# Patient Record
Sex: Female | Born: 1955
Health system: Southern US, Community
[De-identification: ages and names within clinical notes are randomized; demographics above are authoritative.]

## PROBLEM LIST (undated history)

## (undated) DIAGNOSIS — Z9889 Other specified postprocedural states: Secondary | ICD-10-CM

## (undated) DIAGNOSIS — Z972 Presence of dental prosthetic device (complete) (partial): Secondary | ICD-10-CM

## (undated) DIAGNOSIS — R112 Nausea with vomiting, unspecified: Secondary | ICD-10-CM

## (undated) DIAGNOSIS — E039 Hypothyroidism, unspecified: Secondary | ICD-10-CM

## (undated) DIAGNOSIS — I10 Essential (primary) hypertension: Secondary | ICD-10-CM

## (undated) DIAGNOSIS — M1990 Unspecified osteoarthritis, unspecified site: Secondary | ICD-10-CM

## (undated) DIAGNOSIS — Z803 Family history of malignant neoplasm of breast: Secondary | ICD-10-CM

## (undated) DIAGNOSIS — K219 Gastro-esophageal reflux disease without esophagitis: Secondary | ICD-10-CM

## (undated) DIAGNOSIS — G4733 Obstructive sleep apnea (adult) (pediatric): Secondary | ICD-10-CM

## (undated) DIAGNOSIS — A879 Viral meningitis, unspecified: Secondary | ICD-10-CM

## (undated) DIAGNOSIS — S83231A Complex tear of medial meniscus, current injury, right knee, initial encounter: Secondary | ICD-10-CM

## (undated) HISTORY — DX: Obstructive sleep apnea (adult) (pediatric): G47.33

## (undated) HISTORY — DX: Essential (primary) hypertension: I10

## (undated) HISTORY — DX: Viral meningitis, unspecified: A87.9

## (undated) HISTORY — PX: TONSILLECTOMY: SUR1361

## (undated) HISTORY — DX: Gastro-esophageal reflux disease without esophagitis: K21.9

## (undated) HISTORY — DX: Family history of malignant neoplasm of breast: Z80.3

## (undated) HISTORY — PX: APPENDECTOMY: SHX54

## (undated) HISTORY — DX: Hypothyroidism, unspecified: E03.9

---

## 1980-04-23 HISTORY — PX: ABDOMINAL HYSTERECTOMY: SHX81

## 1984-04-23 HISTORY — PX: TOTAL ABDOMINAL HYSTERECTOMY W/ BILATERAL SALPINGOOPHORECTOMY: SHX83

## 1985-04-23 HISTORY — PX: VESICOVAGINAL FISTULA CLOSURE W/ TAH: SUR271

## 1997-04-23 HISTORY — PX: BREAST EXCISIONAL BIOPSY: SUR124

## 2004-10-25 ENCOUNTER — Ambulatory Visit: Payer: Self-pay | Admitting: Internal Medicine

## 2005-03-27 ENCOUNTER — Emergency Department: Payer: Self-pay | Admitting: Emergency Medicine

## 2005-04-11 ENCOUNTER — Ambulatory Visit (HOSPITAL_COMMUNITY): Admission: RE | Admit: 2005-04-11 | Discharge: 2005-04-11 | Payer: Self-pay | Admitting: Chiropractic Medicine

## 2005-04-11 ENCOUNTER — Encounter: Payer: Self-pay | Admitting: Cardiology

## 2005-04-11 ENCOUNTER — Ambulatory Visit: Payer: Self-pay | Admitting: Cardiology

## 2005-09-10 ENCOUNTER — Ambulatory Visit: Payer: Self-pay | Admitting: Specialist

## 2005-11-21 HISTORY — PX: KNEE ARTHROSCOPY W/ LASER: SHX1872

## 2005-12-13 ENCOUNTER — Ambulatory Visit: Payer: Self-pay | Admitting: Specialist

## 2006-04-23 DIAGNOSIS — E039 Hypothyroidism, unspecified: Secondary | ICD-10-CM

## 2006-04-23 HISTORY — DX: Hypothyroidism, unspecified: E03.9

## 2006-06-27 ENCOUNTER — Ambulatory Visit: Payer: Self-pay | Admitting: Unknown Physician Specialty

## 2006-08-22 DIAGNOSIS — A879 Viral meningitis, unspecified: Secondary | ICD-10-CM

## 2006-08-22 HISTORY — DX: Viral meningitis, unspecified: A87.9

## 2006-09-03 ENCOUNTER — Emergency Department: Payer: Self-pay | Admitting: Emergency Medicine

## 2006-09-05 ENCOUNTER — Inpatient Hospital Stay: Payer: Self-pay | Admitting: Internal Medicine

## 2006-11-14 ENCOUNTER — Ambulatory Visit: Payer: Self-pay | Admitting: Internal Medicine

## 2006-11-14 DIAGNOSIS — K219 Gastro-esophageal reflux disease without esophagitis: Secondary | ICD-10-CM | POA: Insufficient documentation

## 2006-11-14 DIAGNOSIS — E039 Hypothyroidism, unspecified: Secondary | ICD-10-CM | POA: Insufficient documentation

## 2007-01-23 ENCOUNTER — Ambulatory Visit: Payer: Self-pay | Admitting: Internal Medicine

## 2007-02-04 ENCOUNTER — Ambulatory Visit: Payer: Self-pay

## 2007-02-24 ENCOUNTER — Encounter (INDEPENDENT_AMBULATORY_CARE_PROVIDER_SITE_OTHER): Payer: Self-pay | Admitting: *Deleted

## 2007-05-15 ENCOUNTER — Ambulatory Visit: Payer: Self-pay | Admitting: Internal Medicine

## 2007-05-29 ENCOUNTER — Telehealth (INDEPENDENT_AMBULATORY_CARE_PROVIDER_SITE_OTHER): Payer: Self-pay | Admitting: *Deleted

## 2007-05-30 ENCOUNTER — Ambulatory Visit: Payer: Self-pay | Admitting: Internal Medicine

## 2007-08-07 ENCOUNTER — Telehealth: Payer: Self-pay | Admitting: Internal Medicine

## 2007-08-12 ENCOUNTER — Ambulatory Visit: Payer: Self-pay | Admitting: Internal Medicine

## 2007-08-13 ENCOUNTER — Encounter: Payer: Self-pay | Admitting: Internal Medicine

## 2008-01-05 ENCOUNTER — Emergency Department: Payer: Self-pay | Admitting: Emergency Medicine

## 2008-07-06 ENCOUNTER — Ambulatory Visit: Payer: Self-pay | Admitting: Family Medicine

## 2008-07-16 ENCOUNTER — Encounter (INDEPENDENT_AMBULATORY_CARE_PROVIDER_SITE_OTHER): Payer: Self-pay | Admitting: Internal Medicine

## 2008-09-24 ENCOUNTER — Ambulatory Visit: Payer: Self-pay | Admitting: Internal Medicine

## 2008-09-24 DIAGNOSIS — R0989 Other specified symptoms and signs involving the circulatory and respiratory systems: Secondary | ICD-10-CM | POA: Insufficient documentation

## 2008-09-24 DIAGNOSIS — R0609 Other forms of dyspnea: Secondary | ICD-10-CM

## 2008-10-13 ENCOUNTER — Ambulatory Visit: Payer: Self-pay | Admitting: Pulmonary Disease

## 2008-10-13 DIAGNOSIS — G4733 Obstructive sleep apnea (adult) (pediatric): Secondary | ICD-10-CM | POA: Insufficient documentation

## 2008-10-26 ENCOUNTER — Ambulatory Visit: Payer: Self-pay

## 2008-10-26 ENCOUNTER — Encounter: Payer: Self-pay | Admitting: Internal Medicine

## 2008-11-02 ENCOUNTER — Ambulatory Visit: Payer: Self-pay | Admitting: Unknown Physician Specialty

## 2008-11-16 ENCOUNTER — Ambulatory Visit (HOSPITAL_BASED_OUTPATIENT_CLINIC_OR_DEPARTMENT_OTHER): Admission: RE | Admit: 2008-11-16 | Discharge: 2008-11-16 | Payer: Self-pay | Admitting: Pulmonary Disease

## 2008-11-16 ENCOUNTER — Encounter: Payer: Self-pay | Admitting: Pulmonary Disease

## 2008-12-04 ENCOUNTER — Ambulatory Visit: Payer: Self-pay | Admitting: Pulmonary Disease

## 2008-12-06 ENCOUNTER — Telehealth (INDEPENDENT_AMBULATORY_CARE_PROVIDER_SITE_OTHER): Payer: Self-pay | Admitting: *Deleted

## 2008-12-13 ENCOUNTER — Ambulatory Visit: Payer: Self-pay | Admitting: Pulmonary Disease

## 2008-12-15 ENCOUNTER — Ambulatory Visit: Payer: Self-pay | Admitting: Internal Medicine

## 2009-01-21 ENCOUNTER — Ambulatory Visit: Payer: Self-pay | Admitting: Internal Medicine

## 2009-01-28 ENCOUNTER — Ambulatory Visit: Payer: Self-pay | Admitting: Pulmonary Disease

## 2009-02-04 ENCOUNTER — Ambulatory Visit: Payer: Self-pay | Admitting: Internal Medicine

## 2009-05-26 ENCOUNTER — Emergency Department: Payer: Self-pay | Admitting: Emergency Medicine

## 2009-08-25 ENCOUNTER — Ambulatory Visit: Payer: Self-pay

## 2009-09-30 ENCOUNTER — Ambulatory Visit: Payer: Self-pay | Admitting: Internal Medicine

## 2009-09-30 DIAGNOSIS — J309 Allergic rhinitis, unspecified: Secondary | ICD-10-CM | POA: Insufficient documentation

## 2009-10-09 ENCOUNTER — Emergency Department: Payer: Self-pay | Admitting: Emergency Medicine

## 2009-10-10 ENCOUNTER — Ambulatory Visit: Payer: Self-pay | Admitting: Internal Medicine

## 2009-10-10 DIAGNOSIS — R030 Elevated blood-pressure reading, without diagnosis of hypertension: Secondary | ICD-10-CM | POA: Insufficient documentation

## 2009-10-27 ENCOUNTER — Ambulatory Visit: Payer: Self-pay | Admitting: Pulmonary Disease

## 2009-11-14 ENCOUNTER — Ambulatory Visit: Payer: Self-pay | Admitting: Internal Medicine

## 2009-11-14 DIAGNOSIS — I872 Venous insufficiency (chronic) (peripheral): Secondary | ICD-10-CM | POA: Insufficient documentation

## 2009-11-16 ENCOUNTER — Ambulatory Visit: Payer: Self-pay | Admitting: Unknown Physician Specialty

## 2010-01-03 ENCOUNTER — Telehealth: Payer: Self-pay | Admitting: Internal Medicine

## 2010-05-22 ENCOUNTER — Ambulatory Visit
Admission: RE | Admit: 2010-05-22 | Discharge: 2010-05-22 | Payer: Self-pay | Source: Home / Self Care | Attending: Internal Medicine | Admitting: Internal Medicine

## 2010-05-23 NOTE — Progress Notes (Signed)
Summary: lab tests at work  Phone Note Call from Patient Call back at [facility] Phone [phone] Call back at [phone]   Caller: Patient Call For: Sabrina Salt MD Summary of Call: Pt says she had some lab work done at work as part of a screening and some levels came back elevated, indicating possible lupus or arthritis.  I asked her what the tests were and she said she doesnt know, that she is unable to get a copy of the results. I advised her to call the people in charge and at least find out what the tests were and what her levels were.  She said she would try. Initial call taken by: Lowella Petties CMA,  January 03, 2010 4:08 PM  Follow-up for Phone Call        I really can't comment further unless she gets the test results If they did a test on her, she has a RIGHT to a copy. She needs to assert herself and get a copy so I can review Often tests are done without a good reason and then their interpretation is very difficult Follow-up by: Sabrina Salt MD,  January 03, 2010 8:06 PM  Additional Follow-up for Phone Call Additional follow up Details #1::        spoke with patient and she will try and get lab results, per pt this was like a blind study and the people doing the tests don't know her name? I advised pt Dr.Letvak would need the results before anything can be done. Additional Follow-up by: Mervin Hack CMA Duncan Dull),  January 04, 2010 8:13 AM

## 2010-05-23 NOTE — Assessment & Plan Note (Signed)
Summary: CPX / LFW   Vital Signs:  Patient profile:   55 year old female Weight:      247 pounds Temp:     97.0 degrees F tympanic Pulse rate:   72 / minute Pulse rhythm:   regular BP sitting:   130 / 90  (right arm) Cuff size:   large  Vitals Entered By: Mervin Hack CMA (AAMA) (September 30, 2009 10:00 AM) CC: ADULT PHYSICAL   History of Present Illness: Doing well Hasn't been consistent with CPAP works 3rd shift, comes home and just can't wear the mask discussed that she needs to discussed with Dr Christena Deem mask  Has noted some easy bruising has been using some ibuprofen for her knees will check blood work    Allergies: No Known Drug Allergies  Past History:  Past medical, surgical, family and social histories (including risk factors) reviewed for relevance to current acute and chronic problems.  Past Medical History: Viral meningitis--5/08 [facility]) Hypothyroidism--2008 GERD Allergic rhinitis   [facility] Dr Arvil Chaco  Past Surgical History: Reviewed history from 11/14/2006 and no changes required. L knee arthroscopy  8/07 [facility]) Appendectomy as teenager Tonsillectomy as child Hysterectomy 1987  (fibroids)  Family History: Reviewed history from 11/14/2006 and no changes required. Dad alive and well--just had TKR Mom alive and well 3  1/2 sisters 1  1/2 brother No CAD, DM HTN on both sides Cancer on Mom's side--GF, GGF prostate or bladder cancer Mat aunt--breast cancer  Social History: Reviewed history from 09/24/2008 and no changes required. 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 Eyes:  Denies double vision and vision loss-1  eye. ENT:  Denies decreased hearing and ringing in ears; teeth okay--regular with dentist. CV:  Complains of fainting; denies chest pain or discomfort, difficulty breathing at night, difficulty breathing while lying down, lightheadness, palpitations, and shortness of breath with exertion; passed out at work in [facility] out in ER. Nothing found. Resp:  Denies cough and shortness of breath. GI:  Complains of indigestion; denies abdominal pain, bloody stools, change in bowel habits, dark tarry stools, nausea, and vomiting; uses PPI as needed only. GU:  Complains of incontinence; denies dysuria; urgency and occ incontinence gyn appt soon--Dr Arvil Chaco no sexual problems. MS:  Complains of joint pain; denies joint swelling; left knee pain--saw [facility] (cartilage deterioration). Derm:  Complains of rash; denies lesion(s); rash in [place] last week. Better now--thinks it may have been the heat. Neuro:  Complains of headaches; denies numbness, tingling, and weakness; occ headaches. Psych:  Denies anxiety and depression. Heme:  Complains of abnormal bruising; denies enlarge lymph nodes. Allergy:  Complains of seasonal allergies and sneezing; occ uses OTC meds.  Physical Exam  General:  alert and normal appearance.   Eyes:  pupils equal, pupils round, and pupils reactive to light.   Ears:  R ear normal and L ear normal.   Mouth:  no erythema, no exudates, and no lesions.   Neck:  supple, no masses, no thyromegaly, no carotid bruits, and no cervical lymphadenopathy.   Lungs:  normal respiratory effort and normal breath sounds.   Heart:  normal rate, regular rhythm, no murmur, and no gallop.   Abdomen:  soft, non-tender, and no masses.  Msk:  no joint tenderness and no joint swelling.   Pulses:  1+ in feet Extremities:  no edema Neurologic:  alert & oriented X3, strength normal in all extremities, and gait normal.   Skin:  no rashes and no suspicious lesions.   No pathologic bruises  noted on forearm Axillary Nodes:  No palpable lymphadenopathy Psych:  normally interactive, good eye contact, not anxious appearing, and not depressed appearing.     Impression & Recommendations:  Problem # 1:  PREVENTIVE HEALTH CARE (ICD-V70.0) Assessment Comment Only healthy discused fitness Needs Tdap  Problem # 2:  OBSTRUCTIVE SLEEP APNEA (ICD-327.23) Assessment: Comment Only not wearing mask--can't sleep with it will ask Dr Shelle Iron to review this  Problem # 3:  HYPOTHYROIDISM (ICD-244.9) Assessment: Unchanged  due for labs  Her updated medication list for this problem includes:    Levothyroxine Sodium 50 Mcg Tabs (Levothyroxine sodium) .Marland Kitchen... Take 1 tablet by mouth once a day  Orders: Venipuncture (43329) TLB-Renal Function Panel (80069-RENAL) TLB-CBC Platelet - w/Differential (85025-CBCD) TLB-Hepatic/Liver Function Pnl (80076-HEPATIC) TLB-TSH (Thyroid Stimulating Hormone) (84443-TSH) TLB-Lipid Panel (80061-LIPID)  Problem # 4:  GERD (ICD-530.81) Assessment: Comment Only uses meds occ  Her updated medication list for this problem includes:    Prilosec Otc 20 Mg Tbec (Omeprazole magnesium) .Marland Kitchen... As needed  Complete Medication List: 1)  Levothyroxine Sodium 50 Mcg Tabs (Levothyroxine sodium) .... Take 1 tablet by mouth once a day 2)  Prilosec Otc 20 Mg Tbec (Omeprazole magnesium) .... As needed  Other Orders: Tdap => 11yrs IM (51884) Admin 1st Vaccine (16606) Admin 1st Vaccine [facility]) [phone])  Patient Instructions: 1)  Please schedule a follow-up appointment in 1 year.  Prescriptions: LEVOTHYROXINE SODIUM 50 MCG  TABS (LEVOTHYROXINE SODIUM) Take 1 tablet by mouth once a day  #90 x 3   Entered by:   Mervin Hack CMA (AAMA)   Authorized by:   Cindee Salt MD   Signed by:   Mervin Hack CMA (AAMA) on 09/30/2009   Method used:   Print then Give to Patient   RxID:   [id number] LEVOTHYROXINE SODIUM 50 MCG  TABS (LEVOTHYROXINE SODIUM) Take 1  tablet by mouth once a day  #30 x 0   Entered by:   Mervin Hack CMA (AAMA)   Authorized by:   Cindee Salt MD   Signed by:   Mervin Hack CMA (AAMA) on 09/30/2009   Method used:   Electronically to        [facility] [address] [phone]* (retail)       [address], [place]  [address]       Ph: [phone]       Fax: [phone]   RxID:   [id number]   Current Allergies (reviewed today): No known allergies    Tetanus/Td Vaccine    Vaccine Type: Tdap    Site: left deltoid    Mfr: GlaxoSmithKline    Dose: 0.5 ml    Route: IM    Given by: Mervin Hack CMA (AAMA)    Exp. Date: 07/16/2011    Lot #: [id number]    VIS given: 03/11/07 version given September 30, 2009.

## 2010-05-23 NOTE — Assessment & Plan Note (Signed)
Summary: acute sick visit for osa/cpap intolerance   Copy to:  Dr. Tillman Abide Primary Provider/Referring Provider:  Dr. Tillman Abide  CC:  OV - problems with cpap - Pt reports not wearing cpap on a regular basis - c/o feels like she's smothering - Pt now having increased HBP and primary md wants to a recheck on cpap.  History of Present Illness: the pt comes in today for f/u of her known osa.  She has mild osa by sleep study, and last seen in Oct of last year.  Unfortunately, she has not been compliant with her cpap, and the quesiton has been raised whether this is impacting her BP readings.  The pt c/o an inability to tolerate the pressure, although it is hard for her to pinpoint the actual tolerance issue.  She denies any issue with mask, and feels that it fits well and is comfortable.  Without the cpap,she is not sleeping well, and has signiificant daytime sleepiness.  Current Medications (verified): 1)  Levothyroxine Sodium 50 Mcg  Tabs (Levothyroxine Sodium) .... Take 1 Tablet By Mouth Once A Day 2)  Prilosec Otc 20 Mg  Tbec (Omeprazole Magnesium) .... As Needed 3)  Chlorthalidone 25 Mg Tabs (Chlorthalidone) .... 1/2 Tab Daily. Increase To Full Tab As Directed  Allergies (verified): No Known Drug Allergies  Past History:  Past medical, surgical, family and social histories (including risk factors) reviewed, and no changes noted (except as noted below).  Past Medical History: Reviewed history from 09/30/2009 and no changes required. Viral meningitis--5/08 [facility]) Hypothyroidism--2008 GERD Allergic rhinitis   [facility] Dr Arvil Chaco  Past Surgical History: Reviewed history from 11/14/2006 and no changes required. L knee arthroscopy  8/07 [facility]) Appendectomy as teenager Tonsillectomy as child Hysterectomy 1987  (fibroids)  Family History: Reviewed history from 11/14/2006 and no changes required. Dad alive and well--just had TKR Mom alive and well 3  1/2  sisters 1  1/2 brother No CAD, DM HTN on both sides Cancer on Mom's side--GF, GGF prostate or bladder cancer Mat aunt--breast cancer  Social History: Reviewed history from 09/24/2008 and no changes required. 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    Vital Signs:  Patient profile:   55 year old female Height:      65 inches Weight:      245 pounds BMI:     40.92 O2 Sat:      96 % on Room air Temp:     98 degrees F oral Pulse rate:   95 / minute BP sitting:   120 / 68  (left arm) Cuff size:   large  Vitals Entered By: Abigail Miyamoto RN (October 27, 2009 1:57 PM)  O2 Flow:  Room air  Physical Exam  General:  ow female in nad Nose:  swollen turbs, erythematous mucosa, no purulence Mouth:  elongation of soft palate and uvula Extremities:  1+ edema bilat, no cyanosis Neurologic:  sleepy,  but appropriate, moves all 4.   Impression & Recommendations:  Problem # 1:  OBSTRUCTIVE SLEEP APNEA (ICD-327.23) the pt is having very poor tolerance to cpap, but is willing to continue trying.  It is hard to pinpoint the problem, but it sounds like she isn't tolerating the pressure....this is usually the exhalation pressure.  I think we should give her a short trial of bipap to see if there is  better tolerance.  I also discussed with her a dental appliance,but with her nasal congestion this would be difficult.  I think NSR/UP3 per ent would help a lot, but she isn't prepared to consider surgery.  Regarding the question of whether her osa can affect her BP, I suspect not given the mild nature of her disease.  However, each individual is different.  The pt is to call me to update on how her bipap trial is going.  Other Orders: Est. Patient Level IV (84132) DME Referral (DME)  Patient Instructions: 1)  will try bipap device for the next 2-3 weeks to see if we can improve tolerance.  Please call if having tolerance issues. 2)  work on weight loss 3)  nasonex 2 sprays each nostril each am..let me know if helps, and we can call in prescription. 4)  please call at end of 2-3 weeks to let me know how things are going.

## 2010-05-23 NOTE — Assessment & Plan Note (Signed)
Summary: DISCUSS BP/ lb   Vital Signs:  Patient profile:   55 year old female Weight:      244 pounds O2 Sat:      98 % on Room air Pulse rate:   64 / minute Pulse rhythm:   regular BP sitting:   146 / 96  (left arm) Cuff size:   large  Vitals Entered By: Mervin Hack CMA Duncan Dull) (October 10, 2009 3:54 PM)  O2 Flow:  Room air  Serial Vital Signs/Assessments:  Time      Position  BP       Pulse  Resp  Temp     By           R Arm     128/94                         Cindee Salt MD  CC: blood pressure running high   History of Present Illness: At work yesterday--got hot feeling then dizzy after bending over Nurse at work checked BP and it was 192/108 sat for awhile and then she went home noted dizzy feeling when trying to lie down  went to ER worried about stroke CT scan and other work up negative BP only 133/92 by her recollection  No regular headaches no chest pain No SOB no palpitations  Allergies: No Known Drug Allergies  Past History:  Past medical, surgical, family and social histories (including risk factors) reviewed, and no changes noted (except as noted below).  Past Medical History: Reviewed history from 09/30/2009 and no changes required. Viral meningitis--5/08 [facility]) Hypothyroidism--2008 GERD Allergic rhinitis   [facility] Dr Arvil Chaco  Past Surgical History: Reviewed history from 11/14/2006 and no changes required. L knee arthroscopy  8/07 [facility]) Appendectomy as teenager Tonsillectomy as child Hysterectomy 1987  (fibroids)  Family History: Reviewed history from 11/14/2006 and no changes required. Dad alive and well--just had TKR Mom alive and well 3  1/2 sisters 1  1/2 brother No CAD, DM HTN on both sides Cancer on Mom's side--GF, GGF prostate or bladder cancer Mat aunt--breast cancer  Social History: Reviewed history from 09/24/2008 and no changes required. 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   Neck:  supple, no masses, no thyromegaly, no carotid bruits, and no cervical lymphadenopathy.   Lungs:  normal respiratory effort and normal breath sounds.   Heart:  normal rate, regular rhythm, no murmur, and no gallop.   Extremities:  ?slight calf edema Psych:  normally interactive, good eye contact, not anxious appearing, and not depressed appearing.     Impression & Recommendations:  Problem # 1:  ELEVATED BLOOD PRESSURE WITHOUT DIAGNOSIS OF HYPERTENSION (ICD-796.2) Assessment New  borderline high value may have been due to acute dizziness (not vertigo) still not using CPAP  she is concerned about her legs---some swelling and asks for a fluid pill  P: will start chlorthalidone    recheck 1 month    try to check BP at least once a week till follow up  Her updated medication list for this problem includes:    Chlorthalidone 25 Mg Tabs (Chlorthalidone) .Marland Kitchen... 1/2  tab daily. increase to full tab as directed  Complete Medication List: 1)  Levothyroxine Sodium 50 Mcg Tabs (Levothyroxine sodium) .... Take 1 tablet by mouth once a day 2)  Prilosec Otc 20 Mg Tbec (Omeprazole magnesium) .... As needed 3)  Chlorthalidone 25 Mg Tabs (Chlorthalidone) .... 1/2 tab daily. increase to full tab as directed  Patient Instructions: 1)  Please schedule a follow-up appointment in 1 month.  2)  Please check your blood pressure at least weekly Prescriptions: CHLORTHALIDONE 25 MG TABS (CHLORTHALIDONE) 1/2 tab daily. Increase to full tab as directed  #30 x 11   Entered and Authorized by:   Cindee Salt MD   Signed by:   Cindee Salt MD on 10/10/2009   Method used:    Electronically to        [facility] [address] [phone]* (retail)       [address] [place]  [address]       Ph: [phone]       Fax: [phone]   RxID:   [id number]   Current Allergies (reviewed today): No known allergies

## 2010-05-23 NOTE — Assessment & Plan Note (Signed)
Summary: 2:15 1 M F/U DLO   Vital Signs:  Patient profile:   55 year old female Weight:      242.50 pounds Temp:     98.3 degrees F oral Pulse rate:   64 / minute Pulse rhythm:   regular BP sitting:   114 / 82  (left arm) Cuff size:   large  Vitals Entered By: Sydell Axon LPN (November 14, 2009 2:10 PM) CC: One month follow-up, needs a refill on her Levothyroxine at local pharmacy   History of Present Illness: Saw Dr Shelle Iron Still setting up trial of BiPap (hasn't gotten the machine yet--will get loaner)  DOesn't note much difference for fluid status doesn't think she voids as much as she should still only taking 1/2 tab Edema does seem some better  Home BP 110/80 up to 156/93 Most  ~130/80  Allergies: No Known Drug Allergies  Past History:  Past medical, surgical, family and social histories (including risk factors) reviewed for relevance to current acute and chronic problems.  Past Medical History: Reviewed history from 09/30/2009 and no changes required. Viral meningitis--5/08 [facility]) Hypothyroidism--2008 GERD Allergic rhinitis   [facility] Dr Arvil Chaco  Past Surgical History: Reviewed history from 11/14/2006 and no changes required. L knee arthroscopy  8/07 [facility]) Appendectomy as teenager Tonsillectomy as child Hysterectomy 1987  (fibroids)  Family History: Reviewed history from 11/14/2006 and no changes required. Dad alive and well--just had TKR Mom alive and well 3  1/2 sisters 1  1/2 brother No CAD, DM HTN on both sides Cancer on Mom's side--GF, GGF prostate or bladder cancer Mat aunt--breast cancer  Social History: Reviewed history from 09/24/2008 and no changes required. 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   Neck:  supple, no masses, and no thyromegaly.   Lungs:  normal respiratory effort and normal breath sounds.   Heart:  normal rate, regular rhythm, no murmur, and no gallop.   Extremities:  1+ in ankles thick calves without pitting Psych:  normally interactive, good eye contact, not anxious appearing, and not depressed appearing.     Impression & Recommendations:  Problem # 1:  EDEMA- LOCALIZED (ICD-782.3) Assessment Improved  better on the chlorthalidone BP now very good will continue the 1/2 daily  Her updated medication list for this problem includes:    Chlorthalidone 25 Mg Tabs (Chlorthalidone) .Marland Kitchen... 1/2 tab daily. increase to full tab as directed  Orders: Venipuncture (16109) TLB-Renal Function Panel (80069-RENAL) TLB-CBC Platelet - w/Differential (85025-CBCD)  Complete Medication List: 1)  Levothyroxine Sodium 50 Mcg Tabs (Levothyroxine sodium) .... Take 1 tablet by mouth once a day 2)  Prilosec Otc 20 Mg Tbec (Omeprazole magnesium) .... As needed 3)  Chlorthalidone 25 Mg Tabs (Chlorthalidone) .... 1/2 tab daily. increase to full tab as directed  Patient Instructions: 1)  Please schedule a follow-up appointment in 6 months .  2)  Please send labs to [facility] Prescriptions: LEVOTHYROXINE SODIUM 50 MCG  TABS (LEVOTHYROXINE SODIUM) Take 1 tablet by mouth once a day  #30 x 11   Entered by:  Sydell Axon LPN   Authorized by:   Cindee Salt MD   Signed by:   Sydell Axon LPN on 60/45/4098   Method used:   Electronically to        [facility]. [address] [phone]* (retail)       [address] [place]  [address]       Ph: [phone]       Fax: [phone]   RxID:   [id number]   Current Allergies (reviewed today): No known allergies   Appended Document: 2:15 1 M  F/U DLO

## 2010-05-31 NOTE — Assessment & Plan Note (Signed)
Summary: 6 m f/u dlo   Vital Signs:  Patient profile:   55 year old female Weight:      251 pounds Pulse rate:   81 / minute Pulse rhythm:   regular BP sitting:   120 / 78  (left arm) Cuff size:   large  Vitals Entered By: Mervin Hack CMA Duncan Dull) (May 22, 2010 4:10 PM) CC: 6 month follow-up   History of Present Illness: reviewed the phone note from the fall She discussed with  the lab person---she said they were testing something in a study she had consented to  Was told it was nothing to worry about  Still taking chlorthalidone has moved up to a full tab daily Fluid is still a problem--she notes when she gets up and later in the day On her legs all day Occ has throbbing Occ wears support socks--doesn't think it helps more  No SOB No chest pain Walks in the morning if knee is okay  Stomach okay Continues on the omeprazole and zantac Not as good as the nexium hasn't been able to afford it daily  No problems with thyroid  Allergies: No Known Drug Allergies  Past History:  Past medical, surgical, family and social histories (including risk factors) reviewed for relevance to current acute and chronic problems.  Past Medical History: Reviewed history from 09/30/2009 and no changes required. Viral meningitis--5/08 [facility]) Hypothyroidism--2008 GERD Allergic rhinitis   [facility]ULT[facility] Dr Arvil Chaco  Past Surgical History: Reviewed history from 11/14/2006 and no changes required. L knee arthroscopy  8/07 [facility]) Appendectomy as teenager Tonsillectomy as child Hysterectomy 1987  (fibroids)  Family History: Reviewed history from 11/14/2006 and no changes required. Dad alive and well--just had TKR Mom alive and well 3  1/2 sisters 1  1/2 brother No CAD, DM HTN on both sides Cancer on Mom's side--GF, GGF prostate or bladder cancer Mat aunt--breast cancer  Social History: Reviewed history from 09/24/2008 and no changes required. 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   Neck:  supple, no masses, no thyromegaly, and no cervical lymphadenopathy.   Lungs:  normal respiratory effort, no intercostal retractions, no accessory muscle use, and normal breath sounds.   Heart:  normal rate, regular rhythm, no murmur, and no gallop.   Abdomen:  soft and non-tender.   Msk:  no joint tenderness and no joint swelling.   Extremities:  thick calves without sig pitting ?slight edema Psych:  normally interactive, good eye contact, not anxious appearing, and not depressed appearing.     Impression & Recommendations:  Problem # 1:  EDEMA- LOCALIZED (ICD-782.3) Assessment Unchanged due to venous insufficiency could change to lasix for better effect but will keep the chlorthalidone asked her to change to salt substitute  Her updated medication list for this problem includes:    Chlorthalidone 25 Mg Tabs (Chlorthalidone) .Marland Kitchen... 1 tab daily to reduce leg swelling  Problem # 2:  GERD (ICD-530.81) Assessment: Deteriorated has note been able to afford daily Rx liked nexium but never really took the omeprazole daily will send Rx for this   The following medications  were removed from the medication list:    Prilosec Otc 20 Mg Tbec (Omeprazole magnesium) .Marland Kitchen... As needed Her updated medication list for this problem includes:    Omeprazole 20 Mg Cpdr (Omeprazole) .Marland Kitchen... 1 tab daily for acid reflux  Problem # 3:  HYPOTHYROIDISM (ICD-244.9) Assessment: Unchanged euthyroid on dose labs next time  Her updated medication list for this problem  includes:    Levothyroxine Sodium 50 Mcg Tabs (Levothyroxine sodium) .Marland Kitchen... Take 1 tablet by mouth once a day  Complete Medication List: 1)  Levothyroxine Sodium 50 Mcg Tabs (Levothyroxine sodium) .... Take 1 tablet by mouth once a day 2)  Chlorthalidone 25 Mg Tabs (Chlorthalidone) .Marland Kitchen.. 1 tab daily to reduce leg swelling 3)  Omeprazole 20 Mg Cpdr (Omeprazole) .Marland Kitchen.. 1 tab daily for acid reflux  Patient Instructions: 1)  Please switch to salt substitute 2)  Please schedule a follow-up appointment in 6 months .  Prescriptions: OMEPRAZOLE 20 MG CPDR (OMEPRAZOLE) 1 tab daily for acid reflux  #90 x 3   Entered and Authorized by:   Cindee Salt MD   Signed by:   Cindee Salt MD on 05/22/2010   Method used:   Electronically to        [facility] [address] (retail)       [facility]       [address]       [place], [place]  [address]       Ph: [phone]       Fax: [phone]   RxID:   [id number]    Orders Added: 1)  Est. Patient Level IV [25956]    Current Allergies (reviewed today): No known allergies

## 2010-09-05 NOTE — Procedures (Signed)
Sabrina [name], ARTZ NO.:  [id number]   MEDICAL RECORD NO.:  [id number]          PATIENT TYPE:  OUT   LOCATION:  SLEEP CENTER                 FACILITY[facility]  [facility]   PHYSICIAN:  Barbaraann Share, MD,FCCPDATE OF BIRTH:  11-08-57   DATE OF STUDY:  12/04/2008                            NOCTURNAL POLYSOMNOGRAM   REFERRING PHYSICIAN:  Barbaraann Share, MD,FCCP   INDICATION FOR STUDY:  Hypersomnia with sleep apnea.   EPWORTH SLEEPINESS SCORE:  22.   MEDICATIONS:   SLEEP ARCHITECTURE:  The patient had a total sleep time of 360 minutes  with adequate slow wave sleep, but decreased quantity of REM.  Sleep  onset latency was normal at 12 minutes, and REM onset was normal at 68  minutes.  Sleep efficiency was good at 93%.   RESPIRATORY DATA:  The patient was found to have 54 obstructive apneas  and 29 obstructive hypopneas for a apnea-hypopnea index of 14 events per  hour.  The events occurred primarily during REM, and there was loud  snoring noted throughout.   OXYGEN DATA:  There was O2 desaturation as low as 79% with the patient's  obstructive events.   CARDIAC DATA:  No clinically significant arrhythmias were seen.   MOVEMENT-PARASOMNIA:  Small numbers of leg jerks with no significant  sleep disruption were noted.   IMPRESSIONS-RECOMMENDATIONS:  Mild obstructive sleep apnea/hypopnea  syndrome with an apnea-hypopnea index of 14 events per hour and oxygen  desaturation as low as 79%.  The patient's events occurred primarily  during REM and therefore this degree of sleep apnea may have a greater  impact on her quality of life.  Treatment for this degree of sleep apnea  can include a trial of weight loss alone, upper airway surgery, dental  appliance, and also CPAP.  Clinical correlation is suggested.     Barbaraann Share, MD,FCCP  Diplomate, American Board of Sleep  Medicine  Electronically Signed    [facility]MED[facility]  D:  12/04/2008 15:11:52  T:  12/04/2008 22:44:00   Job:  [id number]

## 2010-09-07 ENCOUNTER — Telehealth: Payer: Self-pay | Admitting: *Deleted

## 2010-09-11 NOTE — Telephone Encounter (Signed)
Opened in error

## 2010-09-12 ENCOUNTER — Ambulatory Visit: Payer: 59

## 2010-11-14 ENCOUNTER — Encounter: Payer: Self-pay | Admitting: *Deleted

## 2010-11-30 ENCOUNTER — Encounter: Payer: Self-pay | Admitting: Internal Medicine

## 2010-12-01 ENCOUNTER — Encounter: Payer: Self-pay | Admitting: Internal Medicine

## 2011-02-27 ENCOUNTER — Ambulatory Visit (INDEPENDENT_AMBULATORY_CARE_PROVIDER_SITE_OTHER): Payer: 59 | Admitting: Internal Medicine

## 2011-02-27 ENCOUNTER — Encounter: Payer: Self-pay | Admitting: Internal Medicine

## 2011-02-27 DIAGNOSIS — G589 Mononeuropathy, unspecified: Secondary | ICD-10-CM

## 2011-02-27 DIAGNOSIS — R609 Edema, unspecified: Secondary | ICD-10-CM

## 2011-02-27 DIAGNOSIS — G629 Polyneuropathy, unspecified: Secondary | ICD-10-CM

## 2011-02-27 DIAGNOSIS — Z0000 Encounter for general adult medical examination without abnormal findings: Secondary | ICD-10-CM

## 2011-02-27 DIAGNOSIS — E039 Hypothyroidism, unspecified: Secondary | ICD-10-CM

## 2011-02-27 NOTE — Assessment & Plan Note (Signed)
will check sugar, b12, etc

## 2011-02-27 NOTE — Progress Notes (Signed)
Subjective:    Patient ID: Sabrina [name], female    DOB: August 05, 1955, 55 y.o.   MRN: [id number]  HPI Doing well in general  Having burning sensations in hands Thought it could be gloves at work but no change with different gloves Mostly on dorsal hands occ some in legs also Having leg cramps at night--may even awaken her  Still with [employer] on 3rd shift Does okay with this Still part time as caregiver as well  Doesn't take flu shots---got sick with one years ago Sees Dr Haskel Khan still--has appt in 2 days with him UTD with mammograms  Current Outpatient Prescriptions on File Prior to Visit  Medication Sig Dispense Refill  . chlorthalidone (HYGROTON) 25 MG tablet Take 25 mg by mouth daily.        Marland Kitchen levothyroxine (SYNTHROID, LEVOTHROID) 50 MCG tablet Take 50 mcg by mouth daily.        Marland Kitchen omeprazole (PRILOSEC) 20 MG capsule Take 20 mg by mouth daily.          No Known Allergies  Past Medical History  Diagnosis Date  . Viral meningitis 5/08    [facility]  . Hypothyroidism 2008  . GERD (gastroesophageal reflux disease)   . Allergic rhinitis   . Obstructive sleep apnea     Past Surgical History  Procedure Date  . Knee arthroscopy w/ laser 8/07    Left  . Appendectomy     teenager  . Tonsillectomy     as child  . Vesicovaginal fistula closure w/ tah 1987    Fibroids    Family History  Problem Relation Age of Onset  . Coronary artery disease Neg Hx   . Diabetes Neg Hx   . Hypertension      both sides  . Cancer      maternal side  . Prostate cancer Maternal Grandfather   . Breast cancer Maternal Aunt     History   Social History  . Marital Status: Married    Spouse Name: N/A    Number of Children: N/A  . Years of Education: N/A   Occupational History  . 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     Social History Main Topics  . Smoking status: Never Smoker   . Smokeless tobacco: Never Used  . Alcohol Use: Yes     beer on weekends  . Drug Use: Not on file  . Sexually  Active: Not on file   Other Topics Concern  . Not on file   Social History Narrative   From [place]. Moved to [place] in high school then back her. 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   Review of Systems  Constitutional: Negative for fatigue and unexpected weight change.       Wears seat belt  HENT: Positive for congestion and rhinorrhea. Negative for hearing loss, dental problem and tinnitus.        Uses OTC loratadine prn Regular with dentist  Eyes: Negative for visual disturbance.       No diplopia or unilateral vision loss  Respiratory: Positive for shortness of breath. Negative for cough and chest tightness.        Occ SOB if exposed to ammonia or other strong cleansers  Cardiovascular: Positive for leg swelling. Negative for chest pain and palpitations.       Still with edema despite diuretic  Gastrointestinal: Negative for nausea, vomiting, abdominal pain, constipation and blood in stool.       No heartburn  Genitourinary: Negative for  dysuria, urgency, frequency, difficulty urinating and dyspareunia.  Musculoskeletal: Positive for arthralgias. Negative for back pain and joint swelling.       Some left knee pain Walks for exercise on her break and occ at home also  Skin: Positive for rash.       No suspicious areas Has been diagnosed with rosacea by derm---got Rx  Neurological: Positive for numbness. Negative for dizziness, syncope, weakness, light-headedness and headaches.  Hematological: Negative for adenopathy. Does not bruise/bleed easily.  Psychiatric/Behavioral: Negative for sleep disturbance and dysphoric mood. The patient is not nervous/anxious.        Objective:   Physical Exam  Constitutional: She is oriented to person, place, and time. She appears well-developed and well-nourished. No distress.  HENT:  Head: Normocephalic and atraumatic.  Right Ear: External ear normal.  Left Ear: External ear normal.  Mouth/Throat: Oropharynx is clear and moist. No  oropharyngeal exudate.       TMs normal  Eyes: Conjunctivae and EOM are normal. Pupils are equal, round, and reactive to light.  Neck: Normal range of motion. Neck supple. No thyromegaly present.  Cardiovascular: Normal rate, regular rhythm, normal heart sounds and intact distal pulses.  Exam reveals no gallop.   No murmur heard. Pulmonary/Chest: Effort normal and breath sounds normal. No respiratory distress. She has no wheezes. She has no rales.  Abdominal: Soft. There is no tenderness.  Musculoskeletal: Normal range of motion. She exhibits edema. She exhibits no tenderness.       2+ non pitting edema in calves  Lymphadenopathy:    She has no cervical adenopathy.  Neurological: She is alert and oriented to person, place, and time.  Skin: No rash noted. No erythema.  Psychiatric: She has a normal mood and affect. Her behavior is normal. Judgment and thought content normal.          Assessment & Plan:

## 2011-02-27 NOTE — Assessment & Plan Note (Signed)
Due for labs

## 2011-02-27 NOTE — Progress Notes (Signed)
Addended by: Alvina Chou on: 02/27/2011 02:55 PM   Modules accepted: Orders

## 2011-02-27 NOTE — Assessment & Plan Note (Signed)
No striking concerns but needs to work on fitness and weight loss Will get mammo through Dr Arvil Chaco

## 2011-02-27 NOTE — Assessment & Plan Note (Signed)
Ongoing despite the diuretic Will check labs Just got compression hose

## 2011-03-06 ENCOUNTER — Encounter: Payer: Self-pay | Admitting: *Deleted

## 2011-04-26 ENCOUNTER — Ambulatory Visit: Payer: Self-pay | Admitting: Unknown Physician Specialty

## 2011-04-27 ENCOUNTER — Ambulatory Visit: Payer: Self-pay | Admitting: Unknown Physician Specialty

## 2011-05-14 ENCOUNTER — Ambulatory Visit: Payer: Self-pay | Admitting: General Surgery

## 2011-05-15 LAB — PATHOLOGY REPORT

## 2011-11-06 ENCOUNTER — Other Ambulatory Visit: Payer: Self-pay | Admitting: *Deleted

## 2011-11-06 MED ORDER — OMEPRAZOLE 20 MG PO CPDR: 20.0000 mg | DELAYED_RELEASE_CAPSULE | Freq: Every day | ORAL | Status: DC

## 2011-11-07 ENCOUNTER — Other Ambulatory Visit: Payer: Self-pay | Admitting: *Deleted

## 2011-11-07 MED ORDER — LEVOTHYROXINE SODIUM 50 MCG PO TABS: 50.0000 ug | ORAL_TABLET | Freq: Every day | ORAL | Status: DC

## 2011-11-07 MED ORDER — CHLORTHALIDONE 25 MG PO TABS: 25.0000 mg | ORAL_TABLET | Freq: Every day | ORAL | Status: DC

## 2011-11-08 ENCOUNTER — Encounter: Payer: Self-pay | Admitting: Internal Medicine

## 2011-11-08 ENCOUNTER — Ambulatory Visit (INDEPENDENT_AMBULATORY_CARE_PROVIDER_SITE_OTHER): Payer: 59 | Admitting: Internal Medicine

## 2011-11-08 VITALS — BP 140/90 | HR 63 | Temp 97.8°F | Ht 65.0 in | Wt 253.0 lb

## 2011-11-08 DIAGNOSIS — E039 Hypothyroidism, unspecified: Secondary | ICD-10-CM

## 2011-11-08 DIAGNOSIS — G4733 Obstructive sleep apnea (adult) (pediatric): Secondary | ICD-10-CM

## 2011-11-08 DIAGNOSIS — R609 Edema, unspecified: Secondary | ICD-10-CM

## 2011-11-08 MED ORDER — FUROSEMIDE 40 MG PO TABS: 40.0000 mg | ORAL_TABLET | Freq: Every day | ORAL | Status: DC | PRN

## 2011-11-08 NOTE — Assessment & Plan Note (Signed)
Seems to be euthyroid Given worsened edema, will recheck labs

## 2011-11-08 NOTE — Assessment & Plan Note (Signed)
Sleeps fair Works 3rd shift so some problems Still on CPAP Schedule could be adding to her edema issues

## 2011-11-08 NOTE — Assessment & Plan Note (Signed)
Has clearly worsened May be due to weather No dietary changes Will add prn lasix Recheck labs

## 2011-11-08 NOTE — Progress Notes (Signed)
Subjective:    Patient ID: Sabrina [name], female    DOB: 1955/10/29, 56 y.o.   MRN: [id number]  HPI Feet swelling has worsened Started about 3 weeks  No changes in diet---working with weight coach to be careful (still skips breakfast)  Some pain in feet Do get better at night then progressively worsen Keeps them elevated when she can sit Not comfortable with support hose  Occ feels some SOB--notes when flat in bed (like turning over). Not really orthopneic No PND though Some DOE with steps--not really different  Current Outpatient Prescriptions on File Prior to Visit  Medication Sig Dispense Refill  . chlorthalidone (HYGROTON) 25 MG tablet Take 1 tablet (25 mg total) by mouth daily.  90 tablet  3  . levothyroxine (SYNTHROID, LEVOTHROID) 50 MCG tablet Take 1 tablet (50 mcg total) by mouth daily.  90 tablet  3  . omeprazole (PRILOSEC) 20 MG capsule Take 1 capsule (20 mg total) by mouth daily.  90 capsule  3    No Known Allergies  Past Medical History  Diagnosis Date  . Viral meningitis 5/08    [facility]  . Hypothyroidism 2008  . GERD (gastroesophageal reflux disease)   . Allergic rhinitis   . Obstructive sleep apnea     Past Surgical History  Procedure Date  . Knee arthroscopy w/ laser 8/07    Left  . Appendectomy     teenager  . Tonsillectomy     as child  . Vesicovaginal fistula closure w/ tah 1987    Fibroids    Family History  Problem Relation Age of Onset  . Coronary artery disease Neg Hx   . Diabetes Neg Hx   . Hypertension      both sides  . Cancer      maternal side  . Prostate cancer Maternal Grandfather   . Breast cancer Maternal Aunt     History   Social History  . Marital Status: Married    Spouse Name: N/A    Number of Children: N/A  . Years of Education: N/A   Occupational History  . 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     Social History Main Topics  . Smoking status: Never Smoker   . Smokeless tobacco: Never Used  . Alcohol Use: Yes     beer on  weekends  . Drug Use: Not on file  . Sexually Active: Not on file   Other Topics Concern  . Not on file   Social History Narrative   From [place]. Moved to [place] in high school then back her. 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   Review of Systems No sores or open areas in feet No rash     Objective:   Physical Exam  Constitutional: She appears well-developed and well-nourished. No distress.  Neck: Normal range of motion. Neck supple. No JVD present. No thyromegaly present.  Cardiovascular: Normal rate, regular rhythm and normal heart sounds.  Exam reveals no gallop.   No murmur heard. Pulmonary/Chest: Effort normal and breath sounds normal. No respiratory distress. She has no wheezes. She has no rales.  Abdominal: Soft. There is no tenderness.       No hepatomegaly  Musculoskeletal: She exhibits edema. She exhibits no tenderness.       2+ mildly pitting edema up to upper thigh Mild tenderness in feet Homan's negative  Lymphadenopathy:    She has no cervical adenopathy.  Skin: No rash noted. No erythema.  Assessment & Plan:

## 2011-11-09 LAB — CBC WITH DIFFERENTIAL/PLATELET
Basophils Absolute: 0 10*3/uL (ref 0.0–0.2)
Eosinophils Absolute: 0.2 10*3/uL (ref 0.0–0.4)
Immature Grans (Abs): 0 10*3/uL (ref 0.0–0.1)
Immature Granulocytes: 0 % (ref 0–2)
Lymphs: 36 % (ref 14–46)
MCH: 29.4 pg (ref 26.6–33.0)
MCHC: 33.4 g/dL (ref 31.5–35.7)
Neutrophils Absolute: 4.3 10*3/uL (ref 1.8–7.8)
Neutrophils Relative %: 53 % (ref 40–74)
RDW: 13.4 % (ref 12.3–15.4)

## 2011-11-09 LAB — BASIC METABOLIC PANEL
Chloride: 103 mmol/L (ref 97–108)
GFR calc Af Amer: 109 mL/min/{1.73_m2} (ref >59)
Glucose: 104 mg/dL — ABNORMAL HIGH (ref 65–99)
Potassium: 4.2 mmol/L (ref 3.5–5.2)

## 2011-11-09 LAB — HEPATIC FUNCTION PANEL
ALT: 13 IU/L (ref 0–32)
Bilirubin, Direct: 0.1 mg/dL (ref 0.00–0.40)

## 2011-11-09 LAB — TSH: TSH: 3.62 u[IU]/mL (ref 0.450–4.500)

## 2011-11-12 ENCOUNTER — Encounter: Payer: Self-pay | Admitting: *Deleted

## 2011-11-13 ENCOUNTER — Ambulatory Visit: Payer: Self-pay | Admitting: General Surgery

## 2012-02-29 ENCOUNTER — Ambulatory Visit (INDEPENDENT_AMBULATORY_CARE_PROVIDER_SITE_OTHER): Payer: 59 | Admitting: Internal Medicine

## 2012-02-29 ENCOUNTER — Encounter: Payer: Self-pay | Admitting: Internal Medicine

## 2012-02-29 VITALS — BP 138/80 | HR 72 | Temp 98.2°F | Wt 249.0 lb

## 2012-02-29 DIAGNOSIS — Z0000 Encounter for general adult medical examination without abnormal findings: Secondary | ICD-10-CM

## 2012-02-29 DIAGNOSIS — R609 Edema, unspecified: Secondary | ICD-10-CM

## 2012-02-29 DIAGNOSIS — E039 Hypothyroidism, unspecified: Secondary | ICD-10-CM

## 2012-02-29 NOTE — Assessment & Plan Note (Signed)
Healthy but not good condition Has tried to eat more healthy but doesn't eat regularly  Must do more walking and eat regular meals to help metabolic rate

## 2012-02-29 NOTE — Progress Notes (Signed)
Subjective:    Patient ID: Sabrina [name], female    DOB: 1956-02-21, 56 y.o.   MRN: [id number]  HPI Has been having some ongoing back itching Seeing dermatologist Gave shot yesterday and ordered special medication--topical  Swelling persists Fairly regular with the diuretic  Still sees Dr Haskel Khan Had breast biopsy by Dr Lemar Livings this spring---was negative  Current Outpatient Prescriptions on File Prior to Visit  Medication Sig Dispense Refill  . chlorthalidone (HYGROTON) 25 MG tablet Take 1 tablet (25 mg total) by mouth daily.  90 tablet  3  . furosemide (LASIX) 40 MG tablet Take 1 tablet (40 mg total) by mouth daily as needed.  30 tablet  1  . levothyroxine (SYNTHROID, LEVOTHROID) 50 MCG tablet Take 1 tablet (50 mcg total) by mouth daily.  90 tablet  3  . omeprazole (PRILOSEC) 20 MG capsule Take 1 capsule (20 mg total) by mouth daily.  90 capsule  3    No Known Allergies  Past Medical History  Diagnosis Date  . Viral meningitis 5/08    [facility]  . Hypothyroidism 2008  . GERD (gastroesophageal reflux disease)   . Allergic rhinitis   . Obstructive sleep apnea     Past Surgical History  Procedure Date  . Knee arthroscopy w/ laser 8/07    Left  . Appendectomy     teenager  . Tonsillectomy     as child  . Vesicovaginal fistula closure w/ tah 1987    Fibroids  . Abdominal hysterectomy     Family History  Problem Relation Age of Onset  . Coronary artery disease Neg Hx   . Diabetes Neg Hx   . Hypertension      both sides  . Cancer      maternal side  . Prostate cancer Maternal Grandfather   . Breast cancer Maternal Aunt   . Cancer Maternal Aunt     breast cancer  . Cancer Mother     mastectomy for breast cancer    History   Social History  . Marital Status: Married    Spouse Name: N/A    Number of Children: N/A  . Years of Education: N/A   Occupational History  . 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 Caregiver --part time     [employer]   Social  History Main Topics  . Smoking status: Never Smoker   . Smokeless tobacco: Never Used  . Alcohol Use: Yes     Comment: beer on weekends  . Drug Use: Not on file  . Sexually Active: Not on file   Other Topics Concern  . Not on file   Social History Narrative   From [place]. Moved to [place] in high school then back her. 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   Review of Systems  Constitutional: Negative for fatigue and unexpected weight change.       Wears seat belt Tries to walk on 15 minute breaks at work  HENT: Positive for congestion and rhinorrhea. Negative for hearing loss, dental problem and tinnitus.        Uses OTC loratadine prn Regular with dentist  Eyes: Negative for visual disturbance.       No diplopia or unilateral vision loss  Respiratory: Positive for cough, chest tightness and shortness of breath.        Gets tight and voice changes when exposed to cleansers  Cardiovascular: Positive for leg swelling. Negative for chest pain and palpitations.  Edema better with elevation Uses support hose but they hurt  Gastrointestinal: Negative for nausea, vomiting, abdominal pain, constipation and blood in stool.       Heartburn controlled with med  Genitourinary: Positive for urgency and frequency. Negative for difficulty urinating.       Occasional stress and urge incontinence No sex--no problem  Musculoskeletal: Negative for back pain, joint swelling and arthralgias.  Skin: Positive for rash.       Back itching  Neurological: Positive for weakness and headaches. Negative for dizziness, syncope, light-headedness and numbness.       Rare headaches--mostly if tired Feels her hand strength is decreased  Hematological: Negative for adenopathy. Bruises/bleeds easily.  Psychiatric/Behavioral: Negative for sleep disturbance and dysphoric mood. The patient is not nervous/anxious.        Objective:   Physical Exam  Constitutional: She is oriented to person, place, and  time. She appears well-developed and well-nourished. No distress.  HENT:  Head: Normocephalic and atraumatic.  Right Ear: External ear normal.  Left Ear: External ear normal.  Mouth/Throat: Oropharynx is clear and moist.  Eyes: Conjunctivae normal and EOM are normal. Pupils are equal, round, and reactive to light.  Neck: Normal range of motion. Neck supple. No thyromegaly present.  Cardiovascular: Normal rate, regular rhythm, normal heart sounds and intact distal pulses.  Exam reveals no gallop.   No murmur heard. Pulmonary/Chest: Effort normal and breath sounds normal. No respiratory distress. She has no wheezes. She has no rales.  Abdominal: Soft. Bowel sounds are normal. There is no tenderness.  Musculoskeletal: Normal range of motion. She exhibits no tenderness.       Very thick calves but no pitting  Lymphadenopathy:    She has no cervical adenopathy.  Neurological: She is alert and oriented to person, place, and time.  Skin: No rash noted. No erythema.       No visible rash to correspond to her pruritic areas  Psychiatric: She has a normal mood and affect. Her behavior is normal. Thought content normal.          Assessment & Plan:

## 2012-02-29 NOTE — Assessment & Plan Note (Signed)
Uses elevation and the diuretics

## 2012-02-29 NOTE — Assessment & Plan Note (Signed)
On replacement

## 2012-03-08 ENCOUNTER — Emergency Department: Payer: Self-pay | Admitting: Emergency Medicine

## 2012-06-13 ENCOUNTER — Ambulatory Visit (INDEPENDENT_AMBULATORY_CARE_PROVIDER_SITE_OTHER): Payer: 59 | Admitting: Internal Medicine

## 2012-06-13 ENCOUNTER — Encounter: Payer: Self-pay | Admitting: Internal Medicine

## 2012-06-13 VITALS — BP 128/88 | HR 73 | Temp 98.2°F | Resp 12 | Wt 259.0 lb

## 2012-06-13 DIAGNOSIS — R1013 Epigastric pain: Secondary | ICD-10-CM

## 2012-06-13 NOTE — Assessment & Plan Note (Signed)
Non specific Mostly epigastric tenderness but some in RUQ--though Murphy's sign absent Could be gallbladder still  ?gastritis from NSAIDs (though on PPI) Could be stress related  P: double prilosec for now      Check labs     Stop NSAIDS

## 2012-06-13 NOTE — Progress Notes (Signed)
Subjective:    Patient ID: Sabrina [name], female    DOB: July 07, 1955, 57 y.o.   MRN: [id number]  HPI Having belly pain Feeling bad all week Cold chills, mouth tastes sour Epigastric pain constant since yesterday Sprite helped some Scared to eat--- just had some crackers last night Some ginger ale today Some nausea but no vomiting No exacerbating factors  Bowels have been regular No blood in stool--did go today No fever  Has been having some headaches Using aleve and then aspirin last week and some earlier in week  Current Outpatient Prescriptions on File Prior to Visit  Medication Sig Dispense Refill  . chlorthalidone (HYGROTON) 25 MG tablet Take 1 tablet (25 mg total) by mouth daily.  90 tablet  3  . furosemide (LASIX) 40 MG tablet Take 1 tablet (40 mg total) by mouth daily as needed.  30 tablet  1  . levothyroxine (SYNTHROID, LEVOTHROID) 50 MCG tablet Take 1 tablet (50 mcg total) by mouth daily.  90 tablet  3  . omeprazole (PRILOSEC) 20 MG capsule Take 1 capsule (20 mg total) by mouth daily.  90 capsule  3   No current facility-administered medications on file prior to visit.    No Known Allergies  Past Medical History  Diagnosis Date  . Viral meningitis 5/08    [facility]  . Hypothyroidism 2008  . GERD (gastroesophageal reflux disease)   . Allergic rhinitis   . Obstructive sleep apnea     Past Surgical History  Procedure Laterality Date  . Knee arthroscopy w/ laser  8/07    Left  . Appendectomy      teenager  . Tonsillectomy      as child  . Vesicovaginal fistula closure w/ tah  1987    Fibroids  . Abdominal hysterectomy      Family History  Problem Relation Age of Onset  . Coronary artery disease Neg Hx   . Diabetes Neg Hx   . Hypertension      both sides  . Cancer      maternal side  . Prostate cancer Maternal Grandfather   . Breast cancer Maternal Aunt   . Cancer Maternal Aunt     breast cancer  . Cancer Mother     mastectomy for breast cancer     History   Social History  . Marital Status: Married    Spouse Name: N/A    Number of Children: N/A  . Years of Education: N/A   Occupational History  . 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 Caregiver --part time     [employer]   Social History Main Topics  . Smoking status: Never Smoker   . Smokeless tobacco: Never Used  . Alcohol Use: Yes     Comment: beer on weekends  . Drug Use: Not on file  . Sexually Active: Not on file   Other Topics Concern  . Not on file   Social History Narrative   From [place]. Moved to [place] in high school then back her. 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     Review of Systems Has ben coughing and that causes hoarseness Slight shortness of breath Some sinus symptoms with sneezing and rhinorrhea for a week    Objective:   Physical Exam  Constitutional: She appears well-developed and well-nourished. No distress.  Neck: Normal range of motion. No thyromegaly present.  Pulmonary/Chest: Effort normal and breath sounds normal. No respiratory distress. She has no wheezes. She  has no rales.  Abdominal: Soft. Bowel sounds are normal. She exhibits no distension and no mass. There is tenderness. There is no rebound and no guarding.  Musculoskeletal: She exhibits no edema and no tenderness.  Lymphadenopathy:    She has no cervical adenopathy.  Psychiatric: She has a normal mood and affect. Her behavior is normal.          Assessment & Plan:

## 2012-06-13 NOTE — Patient Instructions (Signed)
Please stop all over the counter pain medications except acetaminophen. Please take the omeprazole twice a day for now (till the pain is better) Try to eat small amounts of bland food---crackers, yogurt, etc

## 2012-06-14 LAB — CBC WITH DIFFERENTIAL/PLATELET
Basos: 0 % (ref 0–3)
Eosinophils Absolute: 0.1 10*3/uL (ref 0.0–0.4)
Immature Grans (Abs): 0 10*3/uL (ref 0.0–0.1)
Immature Granulocytes: 0 % (ref 0–2)
Lymphs: 31 % (ref 14–46)
MCH: 29.8 pg (ref 26.6–33.0)
Neutrophils Relative %: 63 % (ref 40–74)
RBC: 4.23 x10E6/uL (ref 3.77–5.28)
RDW: 13.9 % (ref 12.3–15.4)
WBC: 9.1 10*3/uL (ref 3.4–10.8)

## 2012-06-14 LAB — BASIC METABOLIC PANEL
BUN: 10 mg/dL (ref 6–24)
Creatinine, Ser: 0.74 mg/dL (ref 0.57–1.00)
GFR calc Af Amer: 106 mL/min/{1.73_m2} (ref >59)
GFR calc non Af Amer: 92 mL/min/{1.73_m2} (ref >59)
Glucose: 98 mg/dL (ref 65–99)
Potassium: 3.9 mmol/L (ref 3.5–5.2)

## 2012-06-14 LAB — HEPATIC FUNCTION PANEL
Bilirubin, Direct: 0.11 mg/dL (ref 0.00–0.40)
Total Protein: 6.7 g/dL (ref 6.0–8.5)

## 2012-06-14 LAB — LIPASE: Lipase: 28 U/L (ref 0–59)

## 2012-08-12 ENCOUNTER — Emergency Department: Payer: Self-pay | Admitting: Emergency Medicine

## 2012-08-12 LAB — CBC
HCT: 41.1 % (ref 35.0–47.0)
HGB: 13.6 g/dL (ref 12.0–16.0)
MCHC: 33.1 g/dL (ref 32.0–36.0)
MCV: 89 fL (ref 80–100)
Platelet: 244 10*3/uL (ref 150–440)
RBC: 4.61 10*6/uL (ref 3.80–5.20)
WBC: 11.7 10*3/uL — ABNORMAL HIGH (ref 3.6–11.0)

## 2012-08-12 LAB — URINALYSIS, COMPLETE
Bilirubin,UR: NEGATIVE
Glucose,UR: NEGATIVE mg/dL (ref 0–75)
Nitrite: NEGATIVE
Ph: 6 (ref 4.5–8.0)
RBC,UR: 3 /HPF (ref 0–5)
Specific Gravity: 1.014 (ref 1.003–1.030)
WBC UR: 15 /HPF (ref 0–5)

## 2012-08-12 LAB — COMPREHENSIVE METABOLIC PANEL
Alkaline Phosphatase: 74 U/L (ref 50–136)
Anion Gap: 5 — ABNORMAL LOW (ref 7–16)
BUN: 10 mg/dL (ref 7–18)
Bilirubin,Total: 0.7 mg/dL (ref 0.2–1.0)
Calcium, Total: 8.9 mg/dL (ref 8.5–10.1)
Chloride: 101 mmol/L (ref 98–107)
Osmolality: 268 (ref 275–301)
Potassium: 3.8 mmol/L (ref 3.5–5.1)
Sodium: 134 mmol/L — ABNORMAL LOW (ref 136–145)
Total Protein: 7.5 g/dL (ref 6.4–8.2)

## 2012-08-21 ENCOUNTER — Ambulatory Visit (INDEPENDENT_AMBULATORY_CARE_PROVIDER_SITE_OTHER): Payer: 59 | Admitting: Internal Medicine

## 2012-08-21 ENCOUNTER — Encounter: Payer: Self-pay | Admitting: Internal Medicine

## 2012-08-21 VITALS — BP 140/100 | HR 70 | Temp 98.0°F | Wt 252.0 lb

## 2012-08-21 DIAGNOSIS — R519 Headache, unspecified: Secondary | ICD-10-CM | POA: Insufficient documentation

## 2012-08-21 DIAGNOSIS — R51 Headache: Secondary | ICD-10-CM

## 2012-08-21 MED ORDER — CYPROHEPTADINE HCL 4 MG PO TABS: 4.0000 mg | ORAL_TABLET | Freq: Every day | ORAL | Status: DC

## 2012-08-21 NOTE — Patient Instructions (Signed)
Please take the cyproheptadine at bedtime. Try to avoid the pain relievers. If your headache persists, I will refer you to a headache specialist

## 2012-08-21 NOTE — Progress Notes (Signed)
Subjective:    Patient ID: Sabrina [name], female    DOB: 12-11-1955, 57 y.o.   MRN: [id number]  HPI Has been having headaches for about 2 weeks  Awoke 4/21 around 2AM --was vomiting and then diarrhea Went to walk in clinic the next day---sent to ER with probably dehydration Had fever and given sublingual med for nausea Given 2 bags of IV fluids also Abdominal pain then ---got IV meds for that also Got info about UTI but hadn't had symptoms. No antibiotics Was told to take tramadol for a week  Stomach is still cramping but no longer having vomiting Stools are loose but not really diarrhea  Ongoing headaches Wakes with nagging frontal headache Takes tylenol, aleve---tries to avoid but has to take for relief (not quite daily though) Tramadol not that helpful  Current Outpatient Prescriptions on File Prior to Visit  Medication Sig Dispense Refill  . chlorthalidone (HYGROTON) 25 MG tablet Take 1 tablet (25 mg total) by mouth daily.  90 tablet  3  . furosemide (LASIX) 40 MG tablet Take 1 tablet (40 mg total) by mouth daily as needed.  30 tablet  1  . levothyroxine (SYNTHROID, LEVOTHROID) 50 MCG tablet Take 1 tablet (50 mcg total) by mouth daily.  90 tablet  3  . omeprazole (PRILOSEC) 20 MG capsule Take 1 capsule (20 mg total) by mouth daily.  90 capsule  3   No current facility-administered medications on file prior to visit.    No Known Allergies  Past Medical History  Diagnosis Date  . Viral meningitis 5/08    [facility]  . Hypothyroidism 2008  . GERD (gastroesophageal reflux disease)   . Allergic rhinitis   . Obstructive sleep apnea     Past Surgical History  Procedure Laterality Date  . Knee arthroscopy w/ laser  8/07    Left  . Appendectomy      teenager  . Tonsillectomy      as child  . Vesicovaginal fistula closure w/ tah  1987    Fibroids  . Abdominal hysterectomy      Family History  Problem Relation Age of Onset  . Coronary artery disease Neg Hx   . Diabetes  Neg Hx   . Hypertension      both sides  . Cancer      maternal side  . Prostate cancer Maternal Grandfather   . Breast cancer Maternal Aunt   . Cancer Maternal Aunt     breast cancer  . Cancer Mother     mastectomy for breast cancer    History   Social History  . Marital Status: Married    Spouse Name: N/A    Number of Children: N/A  . Years of Education: N/A   Occupational History  . 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 Caregiver --part time     [employer]   Social History Main Topics  . Smoking status: Never Smoker   . Smokeless tobacco: Never Used  . Alcohol Use: Yes     Comment: beer on weekends  . Drug Use: Not on file  . Sexually Active: Not on file   Other Topics Concern  . Not on file   Social History Narrative   From [place]. Moved to [place] in high school then back her. 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   Review of Systems No fever now No dysuria or hematuria Sleeps on 1 pillow No neck problems Still works third  Objective:   Physical Exam  Constitutional: She appears well-developed and well-nourished. No distress.  Looks tired  Eyes: Conjunctivae and EOM are normal. Pupils are equal, round, and reactive to light.  Sharp discs  Neck: Normal range of motion. Neck supple. No thyromegaly present.  Cardiovascular: Normal rate, regular rhythm and normal heart sounds.   Pulmonary/Chest: Effort normal and breath sounds normal. No respiratory distress. She has no wheezes. She has no rales.  Abdominal: Soft. There is no tenderness.  Musculoskeletal: She exhibits no edema and no tenderness.  Lymphadenopathy:    She has no cervical adenopathy.  Neurological: She is alert. She has normal strength. She displays no atrophy and no tremor. No cranial nerve deficit. She exhibits abnormal muscle tone. She displays a negative Romberg sign. Coordination and gait normal.  Psychiatric: She has a normal mood and affect. Her behavior is normal.           Assessment & Plan:

## 2012-08-21 NOTE — Assessment & Plan Note (Signed)
Non specific symptoms Has been using analgesics a lot  Some nausea still--but not migrainous Normal neuro exam  Will try cyproheptadine at night to try to break cycle Avoid analgesics Headache referral if persists

## 2012-09-11 ENCOUNTER — Telehealth: Payer: Self-pay

## 2012-09-11 NOTE — Telephone Encounter (Signed)
Pt left v/m h/a is better but feet are swollen and pt wants med for swollen feet. Left v/m for pt to call back.

## 2012-09-12 MED ORDER — FUROSEMIDE 40 MG PO TABS: 40.0000 mg | ORAL_TABLET | Freq: Every day | ORAL | Status: DC | PRN

## 2012-09-12 NOTE — Telephone Encounter (Signed)
If not taking the lasix, then start.  If taking once a day already, then increase to 2 in AM.  Use for the next 3 days if needed and then f/u with PCP is sx persist.  Thanks.

## 2012-09-12 NOTE — Telephone Encounter (Signed)
Left detailed message on voicemail.  

## 2012-09-12 NOTE — Telephone Encounter (Signed)
Left v/m for pt to call back. Dr Alphonsus Sias not available Please advise.

## 2012-11-04 ENCOUNTER — Ambulatory Visit: Payer: 59 | Admitting: Internal Medicine

## 2012-11-05 ENCOUNTER — Encounter: Payer: Self-pay | Admitting: Internal Medicine

## 2012-11-05 ENCOUNTER — Ambulatory Visit (INDEPENDENT_AMBULATORY_CARE_PROVIDER_SITE_OTHER): Payer: 59 | Admitting: Internal Medicine

## 2012-11-05 VITALS — BP 140/90 | HR 89 | Temp 98.5°F | Wt 263.0 lb

## 2012-11-05 DIAGNOSIS — R0600 Dyspnea, unspecified: Secondary | ICD-10-CM

## 2012-11-05 DIAGNOSIS — R609 Edema, unspecified: Secondary | ICD-10-CM

## 2012-11-05 DIAGNOSIS — R0989 Other specified symptoms and signs involving the circulatory and respiratory systems: Secondary | ICD-10-CM

## 2012-11-05 MED ORDER — FUROSEMIDE 40 MG PO TABS: 40.0000 mg | ORAL_TABLET | Freq: Every day | ORAL | Status: DC | PRN

## 2012-11-05 NOTE — Assessment & Plan Note (Signed)
Given change in exercise tolerance and massive edema-- will check echo

## 2012-11-05 NOTE — Assessment & Plan Note (Signed)
Fairly severe now Most likely dietary indiscretion (cold cuts, etc) and venous insufficiency Will try the furosemide regularly echo

## 2012-11-05 NOTE — Patient Instructions (Signed)
Please stop all cold cuts (unless very low salt) and only use salt substitute You take the chlorthalidone every day regardless Now take the furosemide 40mg  every day, till your swelling is better.

## 2012-11-05 NOTE — Progress Notes (Signed)
Subjective:    Patient ID: Sabrina [name], female    DOB: 12-01-1955, 57 y.o.   MRN: [id number]  HPI Having more trouble with leg/feet swelling Going back for a while but worse now Worried about her heart  Some pain at first---hobbles, but then improves some after moving around No improvement overnight  No chest pain No palpitations No dizziness or syncope Walks 2 --15 minute stretches during work day. Tries to go at a quick pace. Does note some easier DOE--but not always Sleeps okay.  Sleeps on 1 pillow---no PND. Nocturia x 2-3-no change  Doesn't add salt after cooking but does use some with cooking Uses support hose frequently---but has to hold off at times when legs are pinched  Current Outpatient Prescriptions on File Prior to Visit  Medication Sig Dispense Refill  . chlorthalidone (HYGROTON) 25 MG tablet Take 1 tablet (25 mg total) by mouth daily.  90 tablet  3  . cyproheptadine (PERIACTIN) 4 MG tablet Take 1 tablet (4 mg total) by mouth at bedtime.  30 tablet  1  . furosemide (LASIX) 40 MG tablet Take 1-2 tablets (40-80 mg total) by mouth daily as needed.  30 tablet  0  . levothyroxine (SYNTHROID, LEVOTHROID) 50 MCG tablet Take 1 tablet (50 mcg total) by mouth daily.  90 tablet  3  . omeprazole (PRILOSEC) 20 MG capsule Take 1 capsule (20 mg total) by mouth daily.  90 capsule  3  . promethazine (PHENERGAN) 25 MG tablet Take 25 mg by mouth every 6 (six) hours as needed.       . traMADol (ULTRAM) 50 MG tablet Take 50-100 mg by mouth every 6 (six) hours as needed.        No current facility-administered medications on file prior to visit.    No Known Allergies  Past Medical History  Diagnosis Date  . Viral meningitis 5/08    [facility]  . Hypothyroidism 2008  . GERD (gastroesophageal reflux disease)   . Allergic rhinitis   . Obstructive sleep apnea     Past Surgical History  Procedure Laterality Date  . Knee arthroscopy w/ laser  8/07    Left  . Appendectomy     teenager  . Tonsillectomy      as child  . Vesicovaginal fistula closure w/ tah  1987    Fibroids  . Abdominal hysterectomy      Family History  Problem Relation Age of Onset  . Coronary artery disease Neg Hx   . Diabetes Neg Hx   . Hypertension      both sides  . Cancer      maternal side  . Prostate cancer Maternal Grandfather   . Breast cancer Maternal Aunt   . Cancer Maternal Aunt     breast cancer  . Cancer Mother     mastectomy for breast cancer    History   Social History  . Marital Status: Married    Spouse Name: N/A    Number of Children: N/A  . Years of Education: N/A   Occupational History  . 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 Caregiver --part time     [employer]   Social History Main Topics  . Smoking status: Never Smoker   . Smokeless tobacco: Never Used  . Alcohol Use: Yes     Comment: beer on weekends  . Drug Use: Not on file  . Sexually Active: Not on file   Other Topics Concern  .  Not on file   Social History Narrative   From [place].   Moved to [place] in high school then back here.     Review of Systems Weight is up 10#--she thinks this is fluid Appetite is fine    Objective:   Physical Exam  Constitutional: She appears well-developed and well-nourished. No distress.  Neck: Normal range of motion. Neck supple. No thyromegaly present.  Cardiovascular: Normal rate, regular rhythm, normal heart sounds and intact distal pulses.  Exam reveals no gallop.   No murmur heard. Pulmonary/Chest: Effort normal and breath sounds normal. No respiratory distress. She has no wheezes. She has no rales.  Abdominal: Soft. There is no tenderness.  No HSM  Musculoskeletal: She exhibits edema.  4+ tense edema Legs shiny but no weeping  Lymphadenopathy:    She has no cervical adenopathy.  Psychiatric: She has a normal mood and affect. Her behavior is normal.          Assessment & Plan:

## 2012-11-18 ENCOUNTER — Other Ambulatory Visit: Payer: Self-pay

## 2012-11-18 ENCOUNTER — Other Ambulatory Visit (INDEPENDENT_AMBULATORY_CARE_PROVIDER_SITE_OTHER): Payer: 59

## 2012-11-18 DIAGNOSIS — R0600 Dyspnea, unspecified: Secondary | ICD-10-CM

## 2012-11-18 DIAGNOSIS — R609 Edema, unspecified: Secondary | ICD-10-CM

## 2012-11-19 ENCOUNTER — Encounter: Payer: Self-pay | Admitting: Internal Medicine

## 2012-11-19 ENCOUNTER — Ambulatory Visit (INDEPENDENT_AMBULATORY_CARE_PROVIDER_SITE_OTHER): Payer: 59 | Admitting: Internal Medicine

## 2012-11-19 VITALS — BP 120/80 | HR 77 | Temp 97.8°F | Wt 264.0 lb

## 2012-11-19 DIAGNOSIS — I872 Venous insufficiency (chronic) (peripheral): Secondary | ICD-10-CM

## 2012-11-19 MED ORDER — FUROSEMIDE 80 MG PO TABS: 80.0000 mg | ORAL_TABLET | Freq: Every day | ORAL | Status: DC

## 2012-11-19 NOTE — Addendum Note (Signed)
Addended by: Liane Comber C on: 11/19/2012 01:05 PM   Modules accepted: Orders

## 2012-11-19 NOTE — Progress Notes (Signed)
Subjective:    Patient ID: Sabrina [name], female    DOB: 07-19-1955, 57 y.o.   MRN: [id number]  HPI Swelling is about the same  Taking the furosemide daily Not on the chlorthalidone Does have some diuretic effect Hard due to 3rd shift  No breathing problems  Current Outpatient Prescriptions on File Prior to Visit  Medication Sig Dispense Refill  . chlorthalidone (HYGROTON) 25 MG tablet Take 1 tablet (25 mg total) by mouth daily.  90 tablet  3  . cyproheptadine (PERIACTIN) 4 MG tablet Take 1 tablet (4 mg total) by mouth at bedtime.  30 tablet  1  . furosemide (LASIX) 40 MG tablet Take 1 tablet (40 mg total) by mouth daily as needed for edema.  30 tablet  0  . levothyroxine (SYNTHROID, LEVOTHROID) 50 MCG tablet Take 1 tablet (50 mcg total) by mouth daily.  90 tablet  3  . omeprazole (PRILOSEC) 20 MG capsule Take 1 capsule (20 mg total) by mouth daily.  90 capsule  3  . promethazine (PHENERGAN) 25 MG tablet Take 25 mg by mouth every 6 (six) hours as needed.       . traMADol (ULTRAM) 50 MG tablet Take 50-100 mg by mouth every 6 (six) hours as needed.        No current facility-administered medications on file prior to visit.    No Known Allergies  Past Medical History  Diagnosis Date  . Viral meningitis 5/08    [facility]  . Hypothyroidism 2008  . GERD (gastroesophageal reflux disease)   . Allergic rhinitis   . Obstructive sleep apnea     Past Surgical History  Procedure Laterality Date  . Knee arthroscopy w/ laser  8/07    Left  . Appendectomy      teenager  . Tonsillectomy      as child  . Vesicovaginal fistula closure w/ tah  1987    Fibroids  . Abdominal hysterectomy      Family History  Problem Relation Age of Onset  . Coronary artery disease Neg Hx   . Diabetes Neg Hx   . Hypertension      both sides  . Cancer      maternal side  . Prostate cancer Maternal Grandfather   . Breast cancer Maternal Aunt   . Cancer Maternal Aunt     breast cancer  . Cancer Mother      mastectomy for breast cancer    History   Social History  . Marital Status: Married    Spouse Name: N/A    Number of Children: N/A  . Years of Education: N/A   Occupational History  . 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 Caregiver --part time     [employer]   Social History Main Topics  . Smoking status: Never Smoker   . Smokeless tobacco: Never Used  . Alcohol Use: Yes     Comment: beer on weekends  . Drug Use: Not on file  . Sexually Active: Not on file   Other Topics Concern  . Not on file   Social History Narrative   From [place].   Moved to [place] in high school then back here.     Review of Systems Trouble sleeping---gets wound up, 2 jobs Not adding salt    Objective:   Physical Exam  Constitutional: She appears well-developed and well-nourished. No distress.  Musculoskeletal: She exhibits edema.  3+ non pitting edema bilaterally Has support stockings  on (knee high) Mild tenderness          Assessment & Plan:

## 2012-11-19 NOTE — Patient Instructions (Signed)
Increase the furosemide to 80mg  daily---you can take 2 -- 40's till they are gone. Use salt substitute Keep you legs up as much as you can.

## 2012-11-19 NOTE — Assessment & Plan Note (Signed)
Discussed elevation Will increase the furosemide Discussed vascular surgeon---she wants to hold off ([place] if she needs it)

## 2012-11-20 LAB — BASIC METABOLIC PANEL
BUN: 11 mg/dL (ref 6–24)
CO2: 25 mmol/L (ref 18–29)
Calcium: 9.7 mg/dL (ref 8.7–10.2)
Chloride: 102 mmol/L (ref 97–108)
Glucose: 91 mg/dL (ref 65–99)

## 2012-11-24 ENCOUNTER — Encounter: Payer: Self-pay | Admitting: *Deleted

## 2013-03-04 ENCOUNTER — Ambulatory Visit (INDEPENDENT_AMBULATORY_CARE_PROVIDER_SITE_OTHER): Payer: 59 | Admitting: Internal Medicine

## 2013-03-04 ENCOUNTER — Encounter: Payer: Self-pay | Admitting: Internal Medicine

## 2013-03-04 VITALS — BP 140/90 | HR 66 | Temp 98.0°F | Ht 65.0 in | Wt 257.0 lb

## 2013-03-04 DIAGNOSIS — Z0000 Encounter for general adult medical examination without abnormal findings: Secondary | ICD-10-CM

## 2013-03-04 DIAGNOSIS — E039 Hypothyroidism, unspecified: Secondary | ICD-10-CM

## 2013-03-04 DIAGNOSIS — I872 Venous insufficiency (chronic) (peripheral): Secondary | ICD-10-CM

## 2013-03-04 NOTE — Assessment & Plan Note (Signed)
Will check labs

## 2013-03-04 NOTE — Assessment & Plan Note (Signed)
Thick calves but no real edema or pitting Continue diuretics

## 2013-03-04 NOTE — Patient Instructions (Signed)
DASH Diet  The DASH diet stands for "Dietary Approaches to Stop Hypertension." It is a healthy eating plan that has been shown to reduce high blood pressure (hypertension) in as little as 14 days, while also possibly providing other significant health benefits. These other health benefits include reducing the risk of breast cancer after menopause and reducing the risk of type 2 diabetes, heart disease, colon cancer, and stroke. Health benefits also include weight loss and slowing kidney failure in patients with chronic kidney disease.   DIET GUIDELINES  · Limit salt (sodium). Your diet should contain less than 1500 mg of sodium daily.  · Limit refined or processed carbohydrates. Your diet should include mostly whole grains. Desserts and added sugars should be used sparingly.  · Include small amounts of heart-healthy fats. These types of fats include nuts, oils, and tub margarine. Limit saturated and trans fats. These fats have been shown to be harmful in the body.  CHOOSING FOODS   The following food groups are based on a 2000 calorie diet. See your Registered Dietitian for individual calorie needs.  Grains and Grain Products (6 to 8 servings daily)  · Eat More Often: Whole-wheat bread, brown rice, whole-grain or wheat pasta, quinoa, popcorn without added fat or salt (air popped).  · Eat Less Often: White bread, white pasta, white rice, cornbread.  Vegetables (4 to 5 servings daily)  · Eat More Often: Fresh, frozen, and canned vegetables. Vegetables may be raw, steamed, roasted, or grilled with a minimal amount of fat.  · Eat Less Often/Avoid: Creamed or fried vegetables. Vegetables in a cheese sauce.  Fruit (4 to 5 servings daily)  · Eat More Often: All fresh, canned (in natural juice), or frozen fruits. Dried fruits without added sugar. One hundred percent fruit juice (½ cup [237 mL] daily).  · Eat Less Often: Dried fruits with added sugar. Canned fruit in light or heavy syrup.  Lean Meats, Fish, and Poultry (2  servings or less daily. One serving is 3 to 4 oz [85-114 g]).  · Eat More Often: Ninety percent or leaner ground beef, tenderloin, sirloin. Round cuts of beef, chicken breast, turkey breast. All fish. Grill, bake, or broil your meat. Nothing should be fried.  · Eat Less Often/Avoid: Fatty cuts of meat, turkey, or chicken leg, thigh, or wing. Fried cuts of meat or fish.  Dairy (2 to 3 servings)  · Eat More Often: Low-fat or fat-free milk, low-fat plain or light yogurt, reduced-fat or part-skim cheese.  · Eat Less Often/Avoid: Milk (whole, 2%). Whole milk yogurt. Full-fat cheeses.  Nuts, Seeds, and Legumes (4 to 5 servings per week)  · Eat More Often: All without added salt.  · Eat Less Often/Avoid: Salted nuts and seeds, canned beans with added salt.  Fats and Sweets (limited)  · Eat More Often: Vegetable oils, tub margarines without trans fats, sugar-free gelatin. Mayonnaise and salad dressings.  · Eat Less Often/Avoid: Coconut oils, palm oils, butter, stick margarine, cream, half and half, cookies, candy, pie.  FOR MORE INFORMATION  The Dash Diet Eating Plan: www.dashdiet.org  Document Released: 03/29/2011 Document Revised: 07/02/2011 Document Reviewed: 03/29/2011  [employer]® Patient Information ©2014 [employer].

## 2013-03-04 NOTE — Progress Notes (Signed)
Pre-visit discussion using our clinic review tool. No additional management support is needed unless otherwise documented below in the visit note.  

## 2013-03-04 NOTE — Assessment & Plan Note (Signed)
Healthy but needs to work on fitness UTD on cancer screening Doesn't want flu shot

## 2013-03-04 NOTE — Progress Notes (Signed)
Subjective:    Patient ID: Sabrina [name], female    DOB: 05-Sep-1955, 57 y.o.   MRN: [id number]  HPI Here for physical Feet were better on vacation--- back to normal Now some back again but not bad Weight down 7#  Walks a lot at work--on her breaks  Current Outpatient Prescriptions on File Prior to Visit  Medication Sig Dispense Refill  . cyproheptadine (PERIACTIN) 4 MG tablet Take 1 tablet (4 mg total) by mouth at bedtime.  30 tablet  1  . furosemide (LASIX) 80 MG tablet Take 1 tablet (80 mg total) by mouth daily.  90 tablet  3  . levothyroxine (SYNTHROID, LEVOTHROID) 50 MCG tablet Take 1 tablet (50 mcg total) by mouth daily.  90 tablet  3  . omeprazole (PRILOSEC) 20 MG capsule Take 1 capsule (20 mg total) by mouth daily.  90 capsule  3  . promethazine (PHENERGAN) 25 MG tablet Take 25 mg by mouth every 6 (six) hours as needed.       . traMADol (ULTRAM) 50 MG tablet Take 50-100 mg by mouth every 6 (six) hours as needed.        No current facility-administered medications on file prior to visit.    No Known Allergies  Past Medical History  Diagnosis Date  . Viral meningitis 5/08    [facility]  . Hypothyroidism 2008  . GERD (gastroesophageal reflux disease)   . Allergic rhinitis   . Obstructive sleep apnea     Past Surgical History  Procedure Laterality Date  . Knee arthroscopy w/ laser  8/07    Left  . Appendectomy      teenager  . Tonsillectomy      as child  . Vesicovaginal fistula closure w/ tah  1987    Fibroids  . Abdominal hysterectomy      Family History  Problem Relation Age of Onset  . Coronary artery disease Neg Hx   . Diabetes Neg Hx   . Hypertension      both sides  . Cancer      maternal side  . Prostate cancer Maternal Grandfather   . Breast cancer Maternal Aunt   . Cancer Maternal Aunt     breast cancer  . Cancer Mother     mastectomy for breast cancer  . Cancer Paternal Uncle     pancreatic cancer    History   Social History  . Marital  Status: Married    Spouse Name: N/A    Number of Children: N/A  . Years of Education: N/A   Occupational History  . 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 Caregiver --part time     [employer]   Social History Main Topics  . Smoking status: Never Smoker   . Smokeless tobacco: Never Used  . Alcohol Use: Yes     Comment: beer on weekends  . Drug Use: Not on file  . Sexual Activity: Not on file   Other Topics Concern  . Not on file   Social History Narrative   From .   Moved to [place] in high school then back here.     Review of Systems  Constitutional: Negative for fatigue and unexpected weight change.       Wears seat belt  HENT: Positive for congestion and rhinorrhea. Negative for dental problem and hearing loss.        Regular with dentist  Eyes: Negative for visual disturbance.  No diplopia or unilateral vision loss  Respiratory: Positive for shortness of breath. Negative for cough and chest tightness.        Occ SOB just lying down--rare and resolves on its own  Cardiovascular: Positive for leg swelling. Negative for chest pain and palpitations.  Gastrointestinal: Negative for nausea, abdominal pain, constipation and blood in stool.       No heartburn  Endocrine: Negative for cold intolerance and heat intolerance.  Genitourinary: Positive for urgency and frequency. Negative for dysuria, difficulty urinating and dyspareunia.  Musculoskeletal: Negative for arthralgias, back pain and joint swelling.  Skin: Negative for rash.       No suspicious lesions but has some bumps on her back she wants checked  Allergic/Immunologic: Positive for environmental allergies. Negative for immunocompromised state.       Uses loratadine  Neurological: Positive for weakness and headaches. Negative for dizziness, syncope, light-headedness and numbness.       Rare headaches Feels her grip strength is down  Hematological: Negative for adenopathy. Bruises/bleeds easily.   Psychiatric/Behavioral: Negative for dysphoric mood. The patient is not nervous/anxious.        Some sleep problems from third shift work       Objective:   Physical Exam  Constitutional: She is oriented to person, place, and time. She appears well-developed and well-nourished. No distress.  HENT:  Head: Normocephalic and atraumatic.  Right Ear: External ear normal.  Left Ear: External ear normal.  Mouth/Throat: Oropharynx is clear and moist. No oropharyngeal exudate.  Eyes: Conjunctivae and EOM are normal. Pupils are equal, round, and reactive to light.  Neck: Normal range of motion. Neck supple. No thyromegaly present.  Cardiovascular: Normal rate, regular rhythm, normal heart sounds and intact distal pulses.  Exam reveals no gallop.   No murmur heard. Pulmonary/Chest: Effort normal and breath sounds normal. No respiratory distress. She has no wheezes. She has no rales.  Abdominal: Soft. There is no tenderness.  Musculoskeletal: She exhibits no edema and no tenderness.  Lymphadenopathy:    She has no cervical adenopathy.  Neurological: She is alert and oriented to person, place, and time.  Skin: No rash noted. No erythema.  Psychiatric: She has a normal mood and affect. Her behavior is normal.          Assessment & Plan:

## 2013-03-05 LAB — BASIC METABOLIC PANEL
BUN/Creatinine Ratio: 19 (ref 9–23)
BUN: 17 mg/dL (ref 6–24)
Creatinine, Ser: 0.9 mg/dL (ref 0.57–1.00)
GFR calc Af Amer: 83 mL/min/{1.73_m2} (ref >59)
Glucose: 96 mg/dL (ref 65–99)

## 2013-03-05 LAB — CBC WITH DIFFERENTIAL/PLATELET
Basos: 0 %
Eos: 1 %
Eosinophils Absolute: 0.1 10*3/uL (ref 0.0–0.4)
Hemoglobin: 12.6 g/dL (ref 11.1–15.9)
Immature Grans (Abs): 0 10*3/uL (ref 0.0–0.1)
Lymphs: 32 %
MCH: 29.7 pg (ref 26.6–33.0)
MCV: 89 fL (ref 79–97)
Monocytes Absolute: 0.7 10*3/uL (ref 0.1–0.9)
Neutrophils Absolute: 5.9 10*3/uL (ref 1.4–7.0)
RBC: 4.24 x10E6/uL (ref 3.77–5.28)
WBC: 9.9 10*3/uL (ref 3.4–10.8)

## 2013-03-05 LAB — T4, FREE: Free T4: 1.21 ng/dL (ref 0.82–1.77)

## 2013-03-05 LAB — TSH: TSH: 4.84 u[IU]/mL — ABNORMAL HIGH (ref 0.450–4.500)

## 2013-03-05 LAB — HEPATIC FUNCTION PANEL
ALT: 15 IU/L (ref 0–32)
Albumin: 4.3 g/dL (ref 3.5–5.5)
Total Bilirubin: 0.3 mg/dL (ref 0.0–1.2)

## 2013-03-05 LAB — LIPID PANEL
Cholesterol, Total: 188 mg/dL (ref 100–199)
Triglycerides: 53 mg/dL (ref 0–149)

## 2013-03-09 ENCOUNTER — Encounter: Payer: Self-pay | Admitting: *Deleted

## 2013-04-24 LAB — HM MAMMOGRAPHY

## 2013-05-29 ENCOUNTER — Encounter: Payer: Self-pay | Admitting: Internal Medicine

## 2013-05-29 ENCOUNTER — Ambulatory Visit (INDEPENDENT_AMBULATORY_CARE_PROVIDER_SITE_OTHER): Payer: 59 | Admitting: Internal Medicine

## 2013-05-29 VITALS — BP 120/82 | HR 55 | Temp 97.9°F | Wt 255.0 lb

## 2013-05-29 DIAGNOSIS — J209 Acute bronchitis, unspecified: Secondary | ICD-10-CM

## 2013-05-29 MED ORDER — AZITHROMYCIN 250 MG PO TABS: ORAL_TABLET | ORAL | Status: DC

## 2013-05-29 NOTE — Patient Instructions (Signed)
Acute Bronchitis Bronchitis is inflammation of the airways that extend from the windpipe into the lungs (bronchi). The inflammation often causes mucus to develop. This leads to a cough, which is the most common symptom of bronchitis.  In acute bronchitis, the condition usually develops suddenly and goes away over time, usually in a couple weeks. Smoking, allergies, and asthma can make bronchitis worse. Repeated episodes of bronchitis may cause further lung problems.  CAUSES Acute bronchitis is most often caused by the same virus that causes a cold. The virus can spread from person to person (contagious).  SIGNS AND SYMPTOMS   Cough.   Fever.   Coughing up mucus.   Body aches.   Chest congestion.   Chills.   Shortness of breath.   Sore throat.  DIAGNOSIS  Acute bronchitis is usually diagnosed through a physical exam. Tests, such as chest X-rays, are sometimes done to rule out other conditions.  TREATMENT  Acute bronchitis usually goes away in a couple weeks. Often times, no medical treatment is necessary. Medicines are sometimes given for relief of fever or cough. Antibiotics are usually not needed but may be prescribed in certain situations. In some cases, an inhaler may be recommended to help reduce shortness of breath and control the cough. A cool mist vaporizer may also be used to help thin bronchial secretions and make it easier to clear the chest.  HOME CARE INSTRUCTIONS  Get plenty of rest.   Drink enough fluids to keep your urine clear or pale yellow (unless you have a medical condition that requires fluid restriction). Increasing fluids may help thin your secretions and will prevent dehydration.   Only take over-the-counter or prescription medicines as directed by your health care provider.   Avoid smoking and secondhand smoke. Exposure to cigarette smoke or irritating chemicals will make bronchitis worse. If you are a smoker, consider using nicotine gum or skin  patches to help control withdrawal symptoms. Quitting smoking will help your lungs heal faster.   Reduce the chances of another bout of acute bronchitis by washing your hands frequently, avoiding people with cold symptoms, and trying not to touch your hands to your mouth, nose, or eyes.   Follow up with your health care provider as directed.  SEEK MEDICAL CARE IF: Your symptoms do not improve after 1 week of treatment.  SEEK IMMEDIATE MEDICAL CARE IF:  You develop an increased fever or chills.   You have chest pain.   You have severe shortness of breath.  You have bloody sputum.   You develop dehydration.  You develop fainting.  You develop repeated vomiting.  You develop a severe headache. MAKE SURE YOU:   Understand these instructions.  Will watch your condition.  Will get help right away if you are not doing well or get worse. Document Released: 05/17/2004 Document Revised: 12/10/2012 Document Reviewed: 09/30/2012 ExitCare Patient Information 2014 [employer].  

## 2013-05-29 NOTE — Progress Notes (Signed)
HPI  Pt presents to the clinic today with c/o chills, cough and wheezing. This started about 4 days ago. She reports the cough is productive of blood tinged yellow sputum. She has tried excedrin and zyrtec OTC. She has had sick contacts. She does have a history of allergie but denies breathing problems. She does not smoke.  Review of Systems      Past Medical History  Diagnosis Date  . Viral meningitis 5/08    [facility]  . Hypothyroidism 2008  . GERD (gastroesophageal reflux disease)   . Allergic rhinitis   . Obstructive sleep apnea     Family History  Problem Relation Age of Onset  . Coronary artery disease Neg Hx   . Diabetes Neg Hx   . Hypertension      both sides  . Cancer      maternal side  . Prostate cancer Maternal Grandfather   . Breast cancer Maternal Aunt   . Cancer Maternal Aunt     breast cancer  . Cancer Mother     mastectomy for breast cancer  . Cancer Paternal Uncle     pancreatic cancer    History   Social History  . Marital Status: Married    Spouse Name: N/A    Number of Children: N/A  . Years of Education: N/A   Occupational History  . 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  . Caregiver --part time     [employer]   Social History Main Topics  . Smoking status: Never Smoker   . Smokeless tobacco: Never Used  . Alcohol Use: Yes     Comment: beer on weekends  . Drug Use: Not on file  . Sexual Activity: Not on file   Other Topics Concern  . Not on file   Social History Narrative   From [place].   Moved to [place] in high school then back here.      No Known Allergies   Constitutional: Positive headache, fatigue. Denies fever or abrupt weight changes.  HEENT:  Denies eye redness, eye pain, pressure behind the eyes, facial pain, nasal congestion, ear pain, ringing in the ears, wax buildup, runny nose or bloody nose. Respiratory: Positive cough. Denies difficulty breathing or shortness of breath.  Cardiovascular: Denies chest pain, chest  tightness, palpitations or swelling in the hands or feet.   No other specific complaints in a complete review of systems (except as listed in HPI above).  Objective:   BP 120/82  Pulse 55  Temp(Src) 97.9 F (36.6 C) (Oral)  Wt 255 lb (115.667 kg)  SpO2 99% Wt Readings from Last 3 Encounters:  05/29/13 255 lb (115.667 kg)  03/04/13 257 lb (116.574 kg)  11/19/12 264 lb (119.75 kg)     General: Appears her stated age, well developed, well nourished in NAD. HEENT: Head: normal shape and size; Eyes: sclera white, no icterus, conjunctiva pink, PERRLA and EOMs intact; Ears: Tm's gray and intact, normal light reflex; Nose: mucosa pink and moist, septum midline; Throat/Mouth: + PND. Teeth present, mucosa erythematous and moist, no exudate noted, no lesions or ulcerations noted.  Neck: Mild cervical lymphadenopathy. Neck supple, trachea midline. No massses, lumps or thyromegaly present.  Cardiovascular: Normal rate and rhythm. S1,S2 noted.  No murmur, rubs or gallops noted. No JVD or BLE edema. No carotid bruits noted. Pulmonary/Chest: Normal effort and scattered rhonchi throughout but mostly on the right side. No respiratory distress. No wheezes, rales or ronchi noted.  Assessment & Plan:   Acute Bronchitis:  Get some rest and drink plenty of water Do salt water gargles for the sore throat eRx for Azithromax x 5 days Try Delsym cough syrup OTC  RTC as needed or if symptoms persist.

## 2013-05-29 NOTE — Progress Notes (Signed)
Pre-visit discussion using our clinic review tool. No additional management support is needed unless otherwise documented below in the visit note.  

## 2013-06-05 ENCOUNTER — Telehealth: Payer: Self-pay

## 2013-06-05 MED ORDER — AMOXICILLIN-POT CLAVULANATE 875-125 MG PO TABS: 1.0000 | ORAL_TABLET | Freq: Two times a day (BID) | ORAL | Status: DC

## 2013-06-05 NOTE — Addendum Note (Signed)
Addended by: Helene Shoe on: 06/05/2013 05:03 PM   Modules accepted: Orders

## 2013-06-05 NOTE — Telephone Encounter (Signed)
rx sent to pharmacy by e-script Left message on machine that rx was sent to the pharmacy

## 2013-06-05 NOTE — Telephone Encounter (Signed)
Okay to send Rx for augmentin 875mg  bid  #20 x 0

## 2013-06-05 NOTE — Telephone Encounter (Signed)
Pt seen 05/29/13; pt finished Z pak, cough is better but still coughing (non prod) and hurts med chest when coughs; today face hurts and upper teeth hurt on lt side of face; pt still has head congestion and when blows nose is clear to yellow mucus. No fever; pt request stronger antibiotic to [place] works third shift and does not want to schedule appt until tries a stronger antibiotic.pt request note to go Dr Silvio Pate.pt request cb.

## 2013-06-05 NOTE — Telephone Encounter (Signed)
Pt calling and augmentin not at pharmacy. Medication phoned to [facility] [address]pharm[facility] as instructed.pt notified done

## 2013-09-16 ENCOUNTER — Encounter: Payer: Self-pay | Admitting: Internal Medicine

## 2013-09-16 ENCOUNTER — Ambulatory Visit (INDEPENDENT_AMBULATORY_CARE_PROVIDER_SITE_OTHER): Payer: 59 | Admitting: Internal Medicine

## 2013-09-16 VITALS — BP 130/80 | HR 57 | Temp 98.3°F | Wt 256.0 lb

## 2013-09-16 DIAGNOSIS — R1314 Dysphagia, pharyngoesophageal phase: Secondary | ICD-10-CM

## 2013-09-16 MED ORDER — OMEPRAZOLE 20 MG PO CPDR: 20.0000 mg | DELAYED_RELEASE_CAPSULE | Freq: Every day | ORAL | Status: DC

## 2013-09-16 NOTE — Patient Instructions (Signed)
Please take the omeprazole every day again--- on an empty stomach. If the pain with swallowing isn't much better within 2 weeks, I will set you up with an ENT doctor.

## 2013-09-16 NOTE — Progress Notes (Signed)
Subjective:    Patient ID: Sabrina [name], female    DOB: [birth year], 58 y.o.   MRN: [id number]  HPI Having trouble when she eats and swallows "like razor blades" Started ~4 days ago Doesn't feel sore when not swallowing  Was sick last week--had hacking cough Doesn't feel sick now  Tried some claritin--thought it was her allergies May have helped some  Had heartburn 2 days ago No meds for this  Current Outpatient Prescriptions on File Prior to Visit  Medication Sig Dispense Refill  . cyproheptadine (PERIACTIN) 4 MG tablet Take 1 tablet (4 mg total) by mouth at bedtime.  30 tablet  1  . furosemide (LASIX) 80 MG tablet Take 1 tablet (80 mg total) by mouth daily.  90 tablet  3  . levothyroxine (SYNTHROID, LEVOTHROID) 50 MCG tablet Take 1 tablet (50 mcg total) by mouth daily.  90 tablet  3  . omeprazole (PRILOSEC) 20 MG capsule Take 1 capsule (20 mg total) by mouth daily.  90 capsule  3  . promethazine (PHENERGAN) 25 MG tablet Take 25 mg by mouth every 6 (six) hours as needed.       . traMADol (ULTRAM) 50 MG tablet Take 50-100 mg by mouth every 6 (six) hours as needed.        No current facility-administered medications on file prior to visit.    No Known Allergies  Past Medical History  Diagnosis Date  . Viral meningitis 5/08    [facility]  . Hypothyroidism 2008  . GERD (gastroesophageal reflux disease)   . Allergic rhinitis   . Obstructive sleep apnea     Past Surgical History  Procedure Laterality Date  . Knee arthroscopy w/ laser  8/07    Left  . Appendectomy      teenager  . Tonsillectomy      as child  . Vesicovaginal fistula closure w/ tah  1987    Fibroids  . Abdominal hysterectomy      Family History  Problem Relation Age of Onset  . Coronary artery disease Neg Hx   . Diabetes Neg Hx   . Hypertension      both sides  . Cancer      maternal side  . Prostate cancer Maternal Grandfather   . Breast cancer Maternal Aunt   . Cancer Maternal Aunt     breast  cancer  . Cancer Mother     mastectomy for breast cancer  . Cancer Paternal Uncle     pancreatic cancer    History   Social History  . Marital Status: Married    Spouse Name: N/A    Number of Children: N/A  . Years of Education: N/A   Occupational History  . 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  . Caregiver --part time     [employer]   Social History Main Topics  . Smoking status: Never Smoker   . Smokeless tobacco: Never Used  . Alcohol Use: Yes     Comment: beer on weekends  . Drug Use: Not on file  . Sexual Activity: Not on file   Other Topics Concern  . Not on file   Social History Narrative   From [place].   Moved to [place] in high school then back here.     Review of Systems Some phlegm in throat Had some blood in phlegm last week---gone now Some hoarseness Hasn't taken her omeprazole in over a month     Objective:  Physical Exam  Constitutional: She appears well-developed and well-nourished. No distress.  HENT:  Nose: Nose normal.  Mouth/Throat: Oropharynx is clear and moist. No oropharyngeal exudate.  TMs normal  Neck: Normal range of motion. Neck supple. No thyromegaly present.  Pulmonary/Chest: Effort normal and breath sounds normal. No respiratory distress. She has no wheezes. She has no rales.  Lymphadenopathy:    She has no cervical adenopathy.          Assessment & Plan:

## 2013-09-16 NOTE — Progress Notes (Signed)
Pre visit review using our clinic review tool, if applicable. No additional management support is needed unless otherwise documented below in the visit note. 

## 2013-09-16 NOTE — Assessment & Plan Note (Signed)
Severe "sore throat" Sounds esophageal and some hoarseness Will have her restart the omeprazole ENT if persists

## 2013-09-23 DIAGNOSIS — M179 Osteoarthritis of knee, unspecified: Secondary | ICD-10-CM | POA: Insufficient documentation

## 2013-09-23 DIAGNOSIS — M1710 Unilateral primary osteoarthritis, unspecified knee: Secondary | ICD-10-CM | POA: Insufficient documentation

## 2013-12-24 ENCOUNTER — Telehealth: Payer: Self-pay | Admitting: Internal Medicine

## 2013-12-24 NOTE — Telephone Encounter (Signed)
Patient Information:  Caller Name: Sabrina Sabrina [name] Sabrina [name]  Phone: [phone]  Patient: Sabrina Sabrina [name], Sabrina [name]  Gender: Female  DOB: [birth year]  Age: 58 Years  PCP: Sabrina Sabrina [name] Sabrina [name] [facility])  Office Follow Up:  Does the office need to follow up with this patient?: No  Instructions For The Office: N/A  RN Note:  Pt refused appt for today and requesting appt after 12 Noon 12/25/13 due to pt works 3rd shift and has another appt in the AM.   Symptoms  Reason For Call & Symptoms: Pt reports she has cough, congestion.  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  Reviewed Health History In EMR: Yes  Reviewed Medications In EMR: Yes  Reviewed Allergies In EMR: Yes  Reviewed Surgeries / Procedures: Yes  Date of Onset of Symptoms: 12/16/2013  Guideline(s) Used:  Cough  Disposition Per Guideline:   See Today in Office  Reason For Disposition Reached:   Coughing up rusty-colored (reddish-brown) or blood-tinged sputum  Advice Given:  Call Back If:  You become worse.  Patient Will Follow Care Advice:  YES  Appointment Scheduled:  12/25/2013 12:30:00 Appointment Scheduled Provider:  Elsie Stain Brigitte Pulse) [facility])

## 2013-12-24 NOTE — Telephone Encounter (Signed)
noted 

## 2013-12-25 ENCOUNTER — Encounter: Payer: Self-pay | Admitting: Family Medicine

## 2013-12-25 ENCOUNTER — Ambulatory Visit (INDEPENDENT_AMBULATORY_CARE_PROVIDER_SITE_OTHER): Payer: 59 | Admitting: Family Medicine

## 2013-12-25 ENCOUNTER — Encounter: Payer: Self-pay | Admitting: *Deleted

## 2013-12-25 VITALS — BP 122/80 | HR 84 | Temp 98.0°F | Wt 253.2 lb

## 2013-12-25 DIAGNOSIS — R059 Cough, unspecified: Secondary | ICD-10-CM

## 2013-12-25 DIAGNOSIS — R05 Cough: Secondary | ICD-10-CM

## 2013-12-25 MED ORDER — AZITHROMYCIN 250 MG PO TABS
ORAL_TABLET | ORAL | Status: DC
Start: 2013-12-25 — End: 2014-03-11

## 2013-12-25 MED ORDER — BENZONATATE 200 MG PO CAPS: 200.0000 mg | ORAL_CAPSULE | Freq: Three times a day (TID) | ORAL | Status: DC | PRN

## 2013-12-25 NOTE — Progress Notes (Signed)
Pre visit review using our clinic review tool, if applicable. No additional management support is needed unless otherwise documented below in the visit note.  Cough.  Sx stated about 1 week ago.  Had been exposed to fumes at work, cleaners. Was nauseated initially.  Started coughing after that.  Last night had coughed until she vomited.  No fevers.  Some sputum, less now.  Mainly dry cough now.  ST now.  B ear pain.  Some rhinorrhea.  No abd pain.  No diarrhea in the last few days.  Voice is affected.  Still swallowing well.  Some wheeze, doesn't normally wheeze at baseline.    Meds, vitals, and allergies reviewed.   ROS: See HPI.  Otherwise, noncontributory.  GEN: nad, alert and oriented HEENT: mucous membranes moist, tm w/o erythema, nasal exam w/o erythema, clear discharge noted,  OP with cobblestoning NECK: supple w/o LA CV: rrr.   PULM: ctab, no inc wob EXT: no edema SKIN: no acute rash

## 2013-12-25 NOTE — Patient Instructions (Signed)
Use tessalon for the cough.  If not better in a few days, then start the antibiotics.  Drink plenty of fluids, take tylenol as needed, and gargle with warm salt water for your throat.  This should gradually improve.  Take care.  Let us know if you have other concerns.   Out of work in the meantime.

## 2013-12-28 DIAGNOSIS — R05 Cough: Secondary | ICD-10-CM | POA: Insufficient documentation

## 2013-12-28 DIAGNOSIS — R059 Cough, unspecified: Secondary | ICD-10-CM | POA: Insufficient documentation

## 2013-12-28 NOTE — Assessment & Plan Note (Signed)
She could have had bronchitis but is likely improving with less sputum now.  Likely will continue to improve, nontoxic, no wheeze now.  Use tessalon for the cough. If not better in a few days, then start the zmax.  She'll likely not need the abx.  F/u prn.

## 2014-02-08 ENCOUNTER — Telehealth: Payer: Self-pay | Admitting: Internal Medicine

## 2014-02-08 NOTE — Telephone Encounter (Signed)
Pt dropped off form to fill out for insurance. Will put inbox.  Thanks [employer]

## 2014-02-09 NOTE — Telephone Encounter (Signed)
Form on your desk  

## 2014-02-09 NOTE — Telephone Encounter (Signed)
Form done No charge 

## 2014-02-09 NOTE — Telephone Encounter (Signed)
I spoke with patient.  She said she'll pick up form tomorrow.

## 2014-03-05 ENCOUNTER — Encounter: Payer: 59 | Admitting: Internal Medicine

## 2014-03-08 ENCOUNTER — Encounter: Payer: 59 | Admitting: Internal Medicine

## 2014-03-11 ENCOUNTER — Encounter (INDEPENDENT_AMBULATORY_CARE_PROVIDER_SITE_OTHER): Payer: Self-pay

## 2014-03-11 ENCOUNTER — Encounter: Payer: Self-pay | Admitting: Internal Medicine

## 2014-03-11 ENCOUNTER — Ambulatory Visit (INDEPENDENT_AMBULATORY_CARE_PROVIDER_SITE_OTHER): Payer: 59 | Admitting: Internal Medicine

## 2014-03-11 VITALS — BP 150/90 | HR 93 | Temp 97.8°F | Wt 251.0 lb

## 2014-03-11 DIAGNOSIS — M7989 Other specified soft tissue disorders: Secondary | ICD-10-CM

## 2014-03-11 MED ORDER — LEVOTHYROXINE SODIUM 50 MCG PO TABS: 50.0000 ug | ORAL_TABLET | Freq: Every day | ORAL | Status: DC

## 2014-03-11 MED ORDER — CYPROHEPTADINE HCL 4 MG PO TABS: 4.0000 mg | ORAL_TABLET | Freq: Every day | ORAL | Status: DC

## 2014-03-11 MED ORDER — FUROSEMIDE 80 MG PO TABS: 80.0000 mg | ORAL_TABLET | Freq: Every day | ORAL | Status: DC

## 2014-03-11 MED ORDER — CEPHALEXIN 500 MG PO CAPS: 500.0000 mg | ORAL_CAPSULE | Freq: Four times a day (QID) | ORAL | Status: DC

## 2014-03-11 NOTE — Addendum Note (Signed)
Addended by: Despina Hidden on: 03/11/2014 05:10 PM   Modules accepted: Orders

## 2014-03-11 NOTE — Assessment & Plan Note (Signed)
Though a DVT is very unlikely, I will check duplex to be sure Mostly has venous insufficiency May have very slight infection at point of apparent hematoma from past trauma Asked her to restart her meds (especially thyroid and furosemide) Cephalexin for 7 days

## 2014-03-11 NOTE — Progress Notes (Signed)
Subjective:    Patient ID: Sabrina [name], female    DOB: Oct 24, 1955, 58 y.o.   MRN: [id number]  HPI Working part time at [employer] Was getting on the transport vein and hit her right calf on the step--- about 6 weeks ago Turned red and tight 2 weeks ago--- but she thinks it was related to this trauma Feels there is a little knot  Went to walk in care yesterday and was told she has a blood clot She was sent to the ER but it was too crowded  Tried ice Tries to elevate them but is on her feet a lot at work  Has run out of meds in the past Hasn't taken anything other than the omeprazole for about a month  Current Outpatient Prescriptions on File Prior to Visit  Medication Sig Dispense Refill  . cyproheptadine (PERIACTIN) 4 MG tablet Take 1 tablet (4 mg total) by mouth at bedtime. 30 tablet 1  . furosemide (LASIX) 80 MG tablet Take 1 tablet (80 mg total) by mouth daily. 90 tablet 3  . omeprazole (PRILOSEC) 20 MG capsule Take 1 capsule (20 mg total) by mouth daily. 90 capsule 3  . promethazine (PHENERGAN) 25 MG tablet Take 25 mg by mouth every 6 (six) hours as needed.     . traMADol (ULTRAM) 50 MG tablet Take 50-100 mg by mouth every 6 (six) hours as needed.      No current facility-administered medications on file prior to visit.    No Known Allergies  Past Medical History  Diagnosis Date  . Viral meningitis 5/08    [facility]  . Hypothyroidism 2008  . GERD (gastroesophageal reflux disease)   . Allergic rhinitis   . Obstructive sleep apnea     Past Surgical History  Procedure Laterality Date  . Knee arthroscopy w/ laser  8/07    Left  . Appendectomy      teenager  . Tonsillectomy      as child  . Vesicovaginal fistula closure w/ tah  1987    Fibroids  . Abdominal hysterectomy      Family History  Problem Relation Age of Onset  . Coronary artery disease Neg Hx   . Diabetes Neg Hx   . Hypertension      both sides  . Cancer      maternal side  . Prostate  cancer Maternal Grandfather   . Breast cancer Maternal Aunt   . Cancer Maternal Aunt     breast cancer  . Cancer Mother     mastectomy for breast cancer  . Cancer Paternal Uncle     pancreatic cancer    History   Social History  . Marital Status: Married    Spouse Name: N/A    Number of Children: N/A  . Years of Education: N/A   Occupational History  . 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  . Caregiver --part time     [employer]   Social History Main Topics  . Smoking status: Never Smoker   . Smokeless tobacco: Never Used  . Alcohol Use: Yes     Comment: beer on weekends  . Drug Use: Not on file  . Sexual Activity: Not on file   Other Topics Concern  . Not on file   Social History Narrative   From [place].   Moved to [place] in high school then back here.     Review of Systems  No fever Doesn't  feel sick Slight lightheadedness a few days ago No chest pain     Objective:   Physical Exam  Constitutional: She appears well-developed and well-nourished. No distress.  Musculoskeletal:  3+ edema on right and 2+ on left Tender knot in lower anterior right calf (looks like bruise) Some redness with slight warmth below this around calf No cords or tenderness along the veins up to thigh          Assessment & Plan:

## 2014-03-11 NOTE — Progress Notes (Signed)
Pre visit review using our clinic review tool, if applicable. No additional management support is needed unless otherwise documented below in the visit note. 

## 2014-03-12 ENCOUNTER — Ambulatory Visit: Payer: 59 | Admitting: Internal Medicine

## 2014-03-12 ENCOUNTER — Telehealth: Payer: Self-pay

## 2014-03-12 ENCOUNTER — Ambulatory Visit: Payer: Self-pay | Admitting: Internal Medicine

## 2014-03-12 ENCOUNTER — Other Ambulatory Visit: Payer: Self-pay

## 2014-03-12 MED ORDER — OMEPRAZOLE 20 MG PO CPDR: 20.0000 mg | DELAYED_RELEASE_CAPSULE | Freq: Every day | ORAL | Status: DC

## 2014-03-12 MED ORDER — FUROSEMIDE 80 MG PO TABS: 80.0000 mg | ORAL_TABLET | Freq: Every day | ORAL | Status: DC

## 2014-03-12 NOTE — Telephone Encounter (Signed)
Message left advising patient.  

## 2014-03-12 NOTE — Telephone Encounter (Signed)
Pt request furosemide and omeprazole [facility]; pt has already notified [facility] to put med back because she uses mail order. Pt did pick up levothyroxine already at [facility].pt notified furosemide and omeprazole [facility].

## 2014-03-12 NOTE — Telephone Encounter (Signed)
plz notify R leg ultrasound returned negative for blood clot.  Recommend continue treatment plan as outlined by PCP yesterday.

## 2014-03-12 NOTE — Telephone Encounter (Signed)
[place] call report from [facility]--R leg doppler was negative--Donna [place] from [place] called with report

## 2014-03-13 NOTE — Telephone Encounter (Signed)
noted 

## 2014-03-15 ENCOUNTER — Encounter: Payer: Self-pay | Admitting: Internal Medicine

## 2014-05-03 ENCOUNTER — Encounter: Payer: Self-pay | Admitting: Internal Medicine

## 2014-05-03 ENCOUNTER — Ambulatory Visit (INDEPENDENT_AMBULATORY_CARE_PROVIDER_SITE_OTHER): Payer: 59 | Admitting: Internal Medicine

## 2014-05-03 VITALS — BP 128/80 | HR 68 | Temp 98.4°F | Ht 65.0 in | Wt 256.0 lb

## 2014-05-03 DIAGNOSIS — K2100 Gastro-esophageal reflux disease with esophagitis, without bleeding: Secondary | ICD-10-CM

## 2014-05-03 DIAGNOSIS — I872 Venous insufficiency (chronic) (peripheral): Secondary | ICD-10-CM

## 2014-05-03 DIAGNOSIS — E039 Hypothyroidism, unspecified: Secondary | ICD-10-CM

## 2014-05-03 DIAGNOSIS — Z0000 Encounter for general adult medical examination without abnormal findings: Secondary | ICD-10-CM

## 2014-05-03 NOTE — Assessment & Plan Note (Signed)
Healthy but needs to work on fitness and weight loss Still prefers no flu shot Sees Dr Thom Chimes no paps and every other year mammogram (though she has some finding that has prompted the yearly checks apparently)

## 2014-05-03 NOTE — Patient Instructions (Signed)
DASH Eating Plan DASH stands for "Dietary Approaches to Stop Hypertension." The DASH eating plan is a healthy eating plan that has been shown to reduce high blood pressure (hypertension). Additional health benefits may include reducing the risk of type 2 diabetes mellitus, heart disease, and stroke. The DASH eating plan may also help with weight loss. WHAT DO I NEED TO KNOW ABOUT THE DASH EATING PLAN? For the DASH eating plan, you will follow these general guidelines:  Choose foods with a percent daily value for sodium of less than 5% (as listed on the food label).  Use salt-free seasonings or herbs instead of table salt or sea salt.  Check with your health care provider or pharmacist before using salt substitutes.  Eat lower-sodium products, often labeled as "lower sodium" or "no salt added."  Eat fresh foods.  Eat more vegetables, fruits, and low-fat dairy products.  Choose whole grains. Look for the word "whole" as the first word in the ingredient list.  Choose fish and skinless chicken or turkey more often than red meat. Limit fish, poultry, and meat to 6 oz (170 g) each day.  Limit sweets, desserts, sugars, and sugary drinks.  Choose heart-healthy fats.  Limit cheese to 1 oz (28 g) per day.  Eat more home-cooked food and less restaurant, buffet, and fast food.  Limit fried foods.  Cook foods using methods other than frying.  Limit canned vegetables. If you do use them, rinse them well to decrease the sodium.  When eating at a restaurant, ask that your food be prepared with less salt, or no salt if possible. WHAT FOODS CAN I EAT? Seek help from a dietitian for individual calorie needs. Grains Whole grain or whole wheat bread. Brown rice. Whole grain or whole wheat pasta. Quinoa, bulgur, and whole grain cereals. Low-sodium cereals. Corn or whole wheat flour tortillas. Whole grain cornbread. Whole grain crackers. Low-sodium crackers. Vegetables Fresh or frozen vegetables  (raw, steamed, roasted, or grilled). Low-sodium or reduced-sodium tomato and vegetable juices. Low-sodium or reduced-sodium tomato sauce and paste. Low-sodium or reduced-sodium canned vegetables.  Fruits All fresh, canned (in natural juice), or frozen fruits. Meat and Other Protein Products Ground beef (85% or leaner), grass-fed beef, or beef trimmed of fat. Skinless chicken or turkey. Ground chicken or turkey. Pork trimmed of fat. All fish and seafood. Eggs. Dried beans, peas, or lentils. Unsalted nuts and seeds. Unsalted canned beans. Dairy Low-fat dairy products, such as skim or 1% milk, 2% or reduced-fat cheeses, low-fat ricotta or cottage cheese, or plain low-fat yogurt. Low-sodium or reduced-sodium cheeses. Fats and Oils Tub margarines without trans fats. Light or reduced-fat mayonnaise and salad dressings (reduced sodium). Avocado. Safflower, olive, or canola oils. Natural peanut or almond butter. Other Unsalted popcorn and pretzels. The items listed above may not be a complete list of recommended foods or beverages. Contact your dietitian for more options. WHAT FOODS ARE NOT RECOMMENDED? Grains White bread. White pasta. White rice. Refined cornbread. Bagels and croissants. Crackers that contain trans fat. Vegetables Creamed or fried vegetables. Vegetables in a cheese sauce. Regular canned vegetables. Regular canned tomato sauce and paste. Regular tomato and vegetable juices. Fruits Dried fruits. Canned fruit in light or heavy syrup. Fruit juice. Meat and Other Protein Products Fatty cuts of meat. Ribs, chicken wings, bacon, sausage, bologna, salami, chitterlings, fatback, hot dogs, bratwurst, and packaged luncheon meats. Salted nuts and seeds. Canned beans with salt. Dairy Whole or 2% milk, cream, half-and-half, and cream cheese. Whole-fat or sweetened yogurt. Full-fat   cheeses or blue cheese. Nondairy creamers and whipped toppings. Processed cheese, cheese spreads, or cheese  curds. Condiments Onion and garlic salt, seasoned salt, table salt, and sea salt. Canned and packaged gravies. Worcestershire sauce. Tartar sauce. Barbecue sauce. Teriyaki sauce. Soy sauce, including reduced sodium. Steak sauce. Fish sauce. Oyster sauce. Cocktail sauce. Horseradish. Ketchup and mustard. Meat flavorings and tenderizers. Bouillon cubes. Hot sauce. Tabasco sauce. Marinades. Taco seasonings. Relishes. Fats and Oils Butter, stick margarine, lard, shortening, ghee, and bacon fat. Coconut, palm kernel, or palm oils. Regular salad dressings. Other Pickles and olives. Salted popcorn and pretzels. The items listed above may not be a complete list of foods and beverages to avoid. Contact your dietitian for more information. WHERE CAN I FIND MORE INFORMATION? National Heart, Lung, and Blood Institute: www.nhlbi.nih.gov/health/health-topics/topics/dash/ Document Released: 03/29/2011 Document Revised: 08/24/2013 Document Reviewed: 02/11/2013 ExitCare Patient Information 2015 [employer]. This information is not intended to replace advice given to you by your health care provider. Make sure you discuss any questions you have with your health care provider. Exercise to Lose Weight Exercise and a healthy diet may help you lose weight. Your doctor may suggest specific exercises. EXERCISE IDEAS AND TIPS  Choose low-cost things you enjoy doing, such as walking, bicycling, or exercising to workout videos.  Take stairs instead of the elevator.  Walk during your lunch break.  Park your car further away from work or school.  Go to a gym or an exercise class.  Start with 5 to 10 minutes of exercise each day. Build up to 30 minutes of exercise 4 to 6 days a week.  Wear shoes with good support and comfortable clothes.  Stretch before and after working out.  Work out until you breathe harder and your heart beats faster.  Drink extra water when you exercise.  Do not do so much that you  hurt yourself, feel dizzy, or get very short of breath. Exercises that burn about 150 calories:  Running 1  miles in 15 minutes.  Playing volleyball for 45 to 60 minutes.  Washing and waxing a car for 45 to 60 minutes.  Playing touch football for 45 minutes.  Walking 1  miles in 35 minutes.  Pushing a stroller 1  miles in 30 minutes.  Playing basketball for 30 minutes.  Raking leaves for 30 minutes.  Bicycling 5 miles in 30 minutes.  Walking 2 miles in 30 minutes.  Dancing for 30 minutes.  Shoveling snow for 15 minutes.  Swimming laps for 20 minutes.  Walking up stairs for 15 minutes.  Bicycling 4 miles in 15 minutes.  Gardening for 30 to 45 minutes.  Jumping rope for 15 minutes.  Washing windows or floors for 45 to 60 minutes. Document Released: 05/12/2010 Document Revised: 07/02/2011 Document Reviewed: 05/12/2010 ExitCare Patient Information 2015 Exit[employer], [employer]. This information is not intended to replace advice given to you by your health care provider. Make sure you discuss any questions you have with your health care provider.  

## 2014-05-03 NOTE — Progress Notes (Signed)
Subjective:    Patient ID: Sabrina [name], female    DOB: Nov 02, 1955, 59 y.o.   MRN: [id number]  HPI Here for physical  Back on all meds regularly Uses the furosemide at various times depending on her work schedule Legs will go down but then it reaccumulates Gets a lot of pain with support hose Does try to keep her legs elevated as much as possible  No changes in energy levels No change in hair, skin or nails  No swallowing problems on the omeprazole Reflux is quiet  Still sees Dr Vernie Ammons Gets yearly mammogram and pap (discussed this)  Current Outpatient Prescriptions on File Prior to Visit  Medication Sig Dispense Refill  . cyproheptadine (PERIACTIN) 4 MG tablet Take 1 tablet (4 mg total) by mouth at bedtime. 30 tablet 1  . furosemide (LASIX) 80 MG tablet Take 1 tablet (80 mg total) by mouth daily. 90 tablet 3  . levothyroxine (SYNTHROID, LEVOTHROID) 50 MCG tablet Take 1 tablet (50 mcg total) by mouth daily. 90 tablet 3  . omeprazole (PRILOSEC) 20 MG capsule Take 1 capsule (20 mg total) by mouth daily. 90 capsule 3   No current facility-administered medications on file prior to visit.    No Known Allergies  Past Medical History  Diagnosis Date  . Viral meningitis 5/08    [facility]  . Hypothyroidism 2008  . GERD (gastroesophageal reflux disease)   . Allergic rhinitis   . Obstructive sleep apnea     Past Surgical History  Procedure Laterality Date  . Knee arthroscopy w/ laser  8/07    Left  . Appendectomy      teenager  . Tonsillectomy      as child  . Vesicovaginal fistula closure w/ tah  1987    Fibroids  . Abdominal hysterectomy      Family History  Problem Relation Age of Onset  . Coronary artery disease Neg Hx   . Diabetes Neg Hx   . Hypertension      both sides  . Cancer      maternal side  . Prostate cancer Maternal Grandfather   . Breast cancer Maternal Aunt   . Cancer Maternal Aunt     breast cancer  . Cancer Mother     mastectomy for breast  cancer  . Cancer Paternal Uncle     pancreatic cancer    History   Social History  . Marital Status: Married    Spouse Name: N/A    Number of Children: N/A  . Years of Education: N/A   Occupational History  . 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  . Caregiver --part time     [employer]   Social History Main Topics  . Smoking status: Never Smoker   . Smokeless tobacco: Never Used  . Alcohol Use: Yes     Comment: beer on weekends  . Drug Use: Not on file  . Sexual Activity: Not on file   Other Topics Concern  . Not on file   Social History Narrative   From [place].   Moved to [place] in high school then back here.     Review of Systems  Constitutional: Negative for fatigue and unexpected weight change.       Wears seat belt  HENT: Negative for dental problem, hearing loss, tinnitus and trouble swallowing.        Regular with dentist  Eyes: Negative for visual disturbance.  No diplopia or unilateral vision loss  Respiratory: Negative for cough, chest tightness and shortness of breath.   Cardiovascular: Positive for leg swelling. Negative for chest pain and palpitations.  Gastrointestinal: Negative for nausea, vomiting, abdominal pain, constipation and blood in stool.       Gets rectal burning if she has hot sauce 2 days in a row.   Endocrine: Negative for polydipsia and polyuria.  Genitourinary: Negative for frequency, hematuria, difficulty urinating and dyspareunia.  Musculoskeletal: Positive for arthralgias. Negative for back pain and joint swelling.       Knee pains---has needed cortisone shots at [facility]  Skin: Negative for rash.       No suspicious lesions  Allergic/Immunologic: Negative for immunocompromised state.       No meds  Neurological: Negative for dizziness, syncope, weakness, light-headedness, numbness and headaches.  Hematological: Negative for adenopathy. Bruises/bleeds easily.  Psychiatric/Behavioral: Negative for sleep disturbance and dysphoric  mood. The patient is not nervous/anxious.        Objective:   Physical Exam  Constitutional: She is oriented to person, place, and time. She appears well-developed and well-nourished. No distress.  HENT:  Head: Normocephalic and atraumatic.  Right Ear: External ear normal.  Left Ear: External ear normal.  Mouth/Throat: Oropharynx is clear and moist. No oropharyngeal exudate.  Eyes: Conjunctivae and EOM are normal. Pupils are equal, round, and reactive to light.  Neck: Normal range of motion. Neck supple. No thyromegaly present.  Cardiovascular: Normal rate, regular rhythm, normal heart sounds and intact distal pulses.  Exam reveals no gallop.   No murmur heard. Pulmonary/Chest: Effort normal and breath sounds normal. No respiratory distress. She has no wheezes. She has no rales.  Abdominal: Soft. There is no tenderness.  Musculoskeletal: She exhibits no tenderness.  2-3+ non pitting edema  Lymphadenopathy:    She has no cervical adenopathy.  Neurological: She is alert and oriented to person, place, and time.  Skin: No rash noted. No erythema.  Psychiatric: She has a normal mood and affect. Her behavior is normal.          Assessment & Plan:

## 2014-05-03 NOTE — Assessment & Plan Note (Signed)
Can't wear support hose On furosemide Will check potassium level

## 2014-05-03 NOTE — Assessment & Plan Note (Signed)
Seems to be euthyroid---due for labs

## 2014-05-03 NOTE — Progress Notes (Signed)
Pre visit review using our clinic review tool, if applicable. No additional management support is needed unless otherwise documented below in the visit note. 

## 2014-05-03 NOTE — Assessment & Plan Note (Signed)
Swallows fine with daily omeprazole Needs to continue

## 2014-05-04 ENCOUNTER — Encounter: Payer: Self-pay | Admitting: *Deleted

## 2014-05-04 LAB — CBC WITH DIFFERENTIAL/PLATELET
BASOS: 0 %
Basophils Absolute: 0 10*3/uL (ref 0.0–0.2)
EOS ABS: 0.1 10*3/uL (ref 0.0–0.4)
Eos: 2 %
HCT: 39.9 % (ref 34.0–46.6)
Hemoglobin: 13.5 g/dL (ref 11.1–15.9)
Immature Grans (Abs): 0 10*3/uL (ref 0.0–0.1)
Immature Granulocytes: 0 %
LYMPHS ABS: 3 10*3/uL (ref 0.7–3.1)
Lymphs: 34 %
MCH: 30 pg (ref 26.6–33.0)
MCHC: 33.8 g/dL (ref 31.5–35.7)
MCV: 89 fL (ref 79–97)
MONOS ABS: 0.5 10*3/uL (ref 0.1–0.9)
Monocytes: 6 %
NEUTROS PCT: 58 %
Neutrophils Absolute: 5.2 10*3/uL (ref 1.4–7.0)
RBC: 4.5 x10E6/uL (ref 3.77–5.28)
RDW: 13.5 % (ref 12.3–15.4)
WBC: 8.8 10*3/uL (ref 3.4–10.8)

## 2014-05-04 LAB — T4, FREE: Free T4: 1.04 ng/dL (ref 0.82–1.77)

## 2014-05-04 LAB — LIPID PANEL
CHOLESTEROL TOTAL: 199 mg/dL (ref 100–199)
Chol/HDL Ratio: 3.8 ratio units (ref 0.0–4.4)
HDL: 53 mg/dL (ref >39)
LDL Calculated: 130 mg/dL — ABNORMAL HIGH (ref 0–99)
TRIGLYCERIDES: 80 mg/dL (ref 0–149)
VLDL Cholesterol Cal: 16 mg/dL (ref 5–40)

## 2014-05-04 LAB — COMPREHENSIVE METABOLIC PANEL
A/G RATIO: 1.6 (ref 1.1–2.5)
ALBUMIN: 4.2 g/dL (ref 3.5–5.5)
ALK PHOS: 69 IU/L (ref 39–117)
ALT: 18 IU/L (ref 0–32)
AST: 18 IU/L (ref 0–40)
BILIRUBIN TOTAL: 0.5 mg/dL (ref 0.0–1.2)
BUN / CREAT RATIO: 17 (ref 9–23)
BUN: 12 mg/dL (ref 6–24)
CO2: 23 mmol/L (ref 18–29)
CREATININE: 0.71 mg/dL (ref 0.57–1.00)
Calcium: 9.3 mg/dL (ref 8.7–10.2)
Chloride: 98 mmol/L (ref 97–108)
GFR calc non Af Amer: 96 mL/min/{1.73_m2} (ref >59)
GFR, EST AFRICAN AMERICAN: 110 mL/min/{1.73_m2} (ref >59)
GLOBULIN, TOTAL: 2.7 g/dL (ref 1.5–4.5)
Glucose: 101 mg/dL — ABNORMAL HIGH (ref 65–99)
Potassium: 4.2 mmol/L (ref 3.5–5.2)
Sodium: 139 mmol/L (ref 134–144)
Total Protein: 6.9 g/dL (ref 6.0–8.5)

## 2014-05-04 LAB — TSH: TSH: 3.14 u[IU]/mL (ref 0.450–4.500)

## 2014-05-13 LAB — HM MAMMOGRAPHY: HM Mammogram: NORMAL

## 2014-07-15 ENCOUNTER — Encounter: Payer: Self-pay | Admitting: Internal Medicine

## 2015-02-09 ENCOUNTER — Telehealth: Payer: Self-pay | Admitting: Internal Medicine

## 2015-02-09 NOTE — Telephone Encounter (Signed)
Patient dropped off a Health Screening Form to be filled out.  The form is in Dr. Alla German [facility] incoming box.

## 2015-02-09 NOTE — Telephone Encounter (Signed)
Form on your desk  

## 2015-02-23 ENCOUNTER — Encounter: Payer: Self-pay | Admitting: *Deleted

## 2015-03-09 ENCOUNTER — Ambulatory Visit (INDEPENDENT_AMBULATORY_CARE_PROVIDER_SITE_OTHER): Payer: 59 | Admitting: General Surgery

## 2015-03-09 ENCOUNTER — Other Ambulatory Visit: Payer: 59

## 2015-03-09 ENCOUNTER — Encounter: Payer: Self-pay | Admitting: General Surgery

## 2015-03-09 VITALS — BP 140/86 | HR 80 | Resp 16 | Ht 64.0 in | Wt 253.0 lb

## 2015-03-09 DIAGNOSIS — N644 Mastodynia: Secondary | ICD-10-CM

## 2015-03-09 DIAGNOSIS — N6320 Unspecified lump in the left breast, unspecified quadrant: Secondary | ICD-10-CM

## 2015-03-09 DIAGNOSIS — N63 Unspecified lump in breast: Secondary | ICD-10-CM | POA: Diagnosis not present

## 2015-03-09 HISTORY — PX: BREAST BIOPSY: SHX20

## 2015-03-09 NOTE — Progress Notes (Addendum)
Patient ID: Sabrina [name], female   DOB: [birth year], 59 y.o.   MRN: [id number]  Chief Complaint  Patient presents with  . Other    breast pain    HPI Sabrina [name] is a 59 y.o. female here today for bilateral breast pain. Patient states this has been going on since last week. It woke her up from her sleep. The pain is described as more of a burning sensation some days worse than others. Pressure makes it feel better. She has been using some Aleve to help. The right nipple still has a burning sensation.Denies breast injury or trauma. Her most recent mammogram was January 2016.   HPI  Past Medical History  Diagnosis Date  . Viral meningitis 5/08    [facility]  . Hypothyroidism 2008  . GERD (gastroesophageal reflux disease)   . Allergic rhinitis   . Obstructive sleep apnea     Past Surgical History  Procedure Laterality Date  . Knee arthroscopy w/ laser  8/07    Left  . Appendectomy      teenager  . Tonsillectomy      as child  . Vesicovaginal fistula closure w/ tah  1987    Fibroids  . Abdominal hysterectomy  1982    Dr Vernie Ammons assisted by Dr Bary Castilla  . Breast biopsy Left 1999    [facility]    Family History  Problem Relation Age of Onset  . Coronary artery disease Neg Hx   . Diabetes Neg Hx   . Hypertension      both sides  . Cancer      maternal side  . Prostate cancer Maternal Grandfather   . Breast cancer Maternal Aunt   . Cancer Maternal Aunt     breast cancer  . Cancer Mother     mastectomy for breast cancer  . Cancer Paternal Uncle     pancreatic cancer    Social History Social History  Substance Use Topics  . Smoking status: Never Smoker   . Smokeless tobacco: Never Used  . Alcohol Use: Yes     Comment: beer on weekends    No Known Allergies  Current Outpatient Prescriptions  Medication Sig Dispense Refill  . furosemide (LASIX) 80 MG tablet Take 1 tablet (80 mg total) by mouth daily. 90 tablet 3  . levothyroxine (SYNTHROID, LEVOTHROID) 50  MCG tablet Take 1 tablet (50 mcg total) by mouth daily. 90 tablet 3  . omeprazole (PRILOSEC) 20 MG capsule Take 1 capsule (20 mg total) by mouth daily. 90 capsule 3   No current facility-administered medications for this visit.    Review of Systems Review of Systems  Constitutional: Negative.   Respiratory: Negative.   Cardiovascular: Negative.     Blood pressure 140/86, pulse 80, resp. rate 16, height 5\' 4"  (1.626 m), weight 253 lb (114.76 kg).  Physical Exam Physical Exam  Constitutional: She is oriented to person, place, and time. She appears well-developed and well-nourished.  HENT:  Mouth/Throat: Oropharynx is clear and moist.  Eyes: Conjunctivae are normal. No scleral icterus.  Neck: Neck supple.  Cardiovascular: Normal rate, regular rhythm and normal heart sounds.   Pulmonary/Chest: Effort normal and breath sounds normal. Right breast exhibits tenderness. Right breast exhibits no inverted nipple, no mass, no nipple discharge and no skin change. Left breast exhibits mass and tenderness. Left breast exhibits no inverted nipple, no nipple discharge and no skin change.    Scar over sternum. 1 cm nodule at 6 o'clock 6  CFN left breast and tender over serratus muscle. Tender lower inner quadrant right breast and over the serratus muscle.  Lymphadenopathy:    She has no cervical adenopathy.    She has no axillary adenopathy.  Neurological: She is alert and oriented to person, place, and time.  Skin: Skin is warm and dry.  Psychiatric: Her behavior is normal.    Data Reviewed 05/13/2014 mammogram completed at [facility] showed dense breasts but otherwise no abnormality. BI-RADS-1.  Office notes from Verlene Mayer, M.D. dated 02/23/2015 were reviewed. Examination for pain in her breast.  Ultrasound examination of the left breast was completed because of the focal area of thickening at the 6:00 position. At the 6:00 position, 2 cm from the nipple a simple cyst measuring 0.4  x 0.6 x 0.7 cm is identified. At the 6:00 location, 6 cm the nipple an ill-defined hypoechoic mass or that it was wide measuring 0.57 x 0.66 x 0.75 cm is identified. This was suspicious and the patient was amenable to vacuum assisted biopsy. This correlated with the area of palpable thickening. BI-RADS-4.  10 mL of 0.5% Xylocaine with 0.25% Marcaine with 1-200,000 of epinephrine was utilized well tolerated. 12 core samples were obtained with a 10-gauge Encor device. A postbiopsy clip was placed. No bleeding was noted. Skin defect was closed with benzoin and Steri-Strips followed by Telfa and Tegaderm dressing. Postbiopsy instructions were provided.  Assessment    Mastalgia, with lateral chest wall tenderness, no clear breast etiology.  Focal thickening in the left inferior breast with associated abnormal mammogram.    Plan    The patient will be contacted when the biopsy results are available.    Anti-inflammatories at present for the chest wall discomfort.  PCP:  Viviana Simpler Ref Dr Donney Rankins 03/11/2015, 2:13 PM

## 2015-03-09 NOTE — Patient Instructions (Addendum)
The patient is aware to call back for any questions or concerns.    CARE AFTER BREAST BIOPSY  1. Leave the dressing on that your doctor applied after surgery. It is waterproof. You may bathe, shower and/or swim. The dressing will probably remain intact until your return office visit. If the dressing comes off, you will see small strips of tape against your skin on the incision. Do not remove these strips.  2. You may want to use a gauze,cloth or similar protection in your bra to prevent rubbing against your dressing and incision. This is not necessary, but you may feel more comfortable doing so.  3. It is recommended that you wear a bra day and night to give support to the breast. This will prevent the weight of the breast from pulling on the incision.  4. Your breast will feel hard and lumpy under the incision. Do not be alarmed. This is the underlying stitching of tissue. Softening of this tissue will occur in time.  5. Make sure you call the office and schedule an appointment in one week after your surgery. The office phone number is [phone]. The nurses at [facility] may have already done this for you.  6. You will notice about a week after your office visit that the strips of the tape on your incision will begin to loosen. These may then be removed.  7. Report to your doctor any of the following:  * Severe pain not relieved by your pain medication  *Redness of the incision  * Drainage from the incision  *Fever greater than 101 degrees 

## 2015-03-11 ENCOUNTER — Telehealth: Payer: Self-pay

## 2015-03-11 ENCOUNTER — Telehealth: Payer: Self-pay | Admitting: General Surgery

## 2015-03-11 DIAGNOSIS — N6320 Unspecified lump in the left breast, unspecified quadrant: Secondary | ICD-10-CM | POA: Insufficient documentation

## 2015-03-11 DIAGNOSIS — N644 Mastodynia: Secondary | ICD-10-CM | POA: Insufficient documentation

## 2015-03-11 NOTE — Telephone Encounter (Signed)
The patient was notified that the left breast biopsy completed on November 16 showed no evidence of atypia or malignancy. Benign breast tissue with ductal hyperplasia.  She reports tolerating the procedure well.  The breast discomfort she's been experiencing is not related to the area biopsy, and is more likely generalized soreness of the muscles in the area. She's been encouraged to make use of Advil or Aleve for this, warned that it may result in some fluid retention.  I expect her breast discomfort/chest wall discomfort to resolve over the coming weeks. She'll follow up next week with the staff for a postbiopsy check and then can resume regular follow-ups with her primary care physician.

## 2015-03-11 NOTE — Telephone Encounter (Signed)
Sabrina [name] of [facility] called with pathology report for the patient.  Left breast 6 o'clk biopsy. Benign breast tissue, Usual ductal hyperplasia, negative for atypia.

## 2015-03-11 NOTE — Telephone Encounter (Signed)
Noted great news. 

## 2015-03-16 ENCOUNTER — Ambulatory Visit (INDEPENDENT_AMBULATORY_CARE_PROVIDER_SITE_OTHER): Payer: 59 | Admitting: *Deleted

## 2015-03-16 DIAGNOSIS — N63 Unspecified lump in breast: Secondary | ICD-10-CM

## 2015-03-16 DIAGNOSIS — N6320 Unspecified lump in the left breast, unspecified quadrant: Secondary | ICD-10-CM

## 2015-03-16 NOTE — Progress Notes (Signed)
Patient came in today for a wound check/post left breast biopsy.  The wound is clean, with no signs of infection noted. Follow up as scheduled.

## 2015-03-16 NOTE — Patient Instructions (Signed)
The patient is aware to call back for any questions or concerns.  

## 2015-05-04 ENCOUNTER — Encounter: Payer: Self-pay | Admitting: *Deleted

## 2015-05-04 ENCOUNTER — Encounter: Payer: Self-pay | Admitting: Internal Medicine

## 2015-05-04 ENCOUNTER — Ambulatory Visit (INDEPENDENT_AMBULATORY_CARE_PROVIDER_SITE_OTHER): Payer: 59 | Admitting: Internal Medicine

## 2015-05-04 ENCOUNTER — Ambulatory Visit: Payer: 59 | Admitting: Internal Medicine

## 2015-05-04 VITALS — BP 130/80 | HR 86 | Temp 97.7°F | Wt 254.0 lb

## 2015-05-04 DIAGNOSIS — J209 Acute bronchitis, unspecified: Secondary | ICD-10-CM | POA: Insufficient documentation

## 2015-05-04 DIAGNOSIS — J208 Acute bronchitis due to other specified organisms: Secondary | ICD-10-CM | POA: Diagnosis not present

## 2015-05-04 MED ORDER — AZITHROMYCIN 250 MG PO TABS
ORAL_TABLET | ORAL | Status: DC
Start: 2015-05-04 — End: 2015-08-15

## 2015-05-04 MED ORDER — HYDROCODONE-HOMATROPINE 5-1.5 MG/5ML PO SYRP: 5.0000 mL | ORAL_SOLUTION | Freq: Every evening | ORAL | Status: DC | PRN

## 2015-05-04 NOTE — Progress Notes (Signed)
Pre visit review using our clinic review tool, if applicable. No additional management support is needed unless otherwise documented below in the visit note. 

## 2015-05-04 NOTE — Progress Notes (Signed)
Subjective:    Patient ID: Sabrina [name], female    DOB: [birth year], 60 y.o.   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 furosemide (LASIX) 80 MG tablet Take 1 tablet (80 mg total) by mouth daily. 90 tablet 3  . levothyroxine (SYNTHROID, LEVOTHROID) 50 MCG tablet Take 1 tablet (50 mcg total) by mouth daily. 90 tablet 3  . omeprazole (PRILOSEC) 20 MG capsule Take 1 capsule (20 mg total) by mouth daily. 90 capsule 3   No current facility-administered medications on file prior to visit.    No Known Allergies  Past Medical History  Diagnosis Date  . Viral meningitis 5/08    [facility]  . Hypothyroidism 2008  . GERD (gastroesophageal reflux disease)   . Allergic rhinitis   . Obstructive sleep apnea     Past Surgical History  Procedure Laterality Date  . Knee arthroscopy w/ laser  8/07    Left  . Appendectomy      teenager  . Tonsillectomy      as child  . Vesicovaginal fistula closure w/ tah  1987    Fibroids  . Abdominal hysterectomy  1982    Dr Vernie Ammons assisted by Dr Bary Castilla  . Breast biopsy Left 1999    [facility]    Family History  Problem Relation Age of Onset  . Coronary artery disease Neg Hx   . Diabetes Neg Hx   . Hypertension      both sides  . Cancer      maternal side  . Prostate cancer Maternal Grandfather   . Breast cancer Maternal Aunt   . Cancer  Maternal Aunt     breast cancer  . Cancer Mother     mastectomy for breast cancer  . Cancer Paternal Uncle     pancreatic cancer    Social History   Social History  . Marital Status: Married    Spouse Name: N/A  . Number of Children: N/A  . Years of Education: N/A   Occupational History  . 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  . Caregiver --part time     [employer]   Social History Main Topics  . Smoking status: Never Smoker   . Smokeless tobacco: Never Used  . Alcohol Use: Yes     Comment: beer on weekends  . Drug Use: Not on file  . Sexual Activity: Not on file   Other Topics Concern  . Not on file   Social History Narrative   From [place].   Moved to [place] in high school then back here.     Review of Systems  No rash No vomiting Slight loose stool 3 days ago Appetite is off     Objective:   Physical Exam  Constitutional: She appears well-developed and well-nourished. No distress.  Coarse cough  HENT:  Mouth/Throat: Oropharynx is  clear and moist. No oropharyngeal exudate.  No sinus tenderness TMs normal Mild nasal inflammation  Neck: Normal range of motion. Neck supple. No thyromegaly present.  Pulmonary/Chest: Effort normal and breath sounds normal. No respiratory distress. She has no wheezes. She has no rales.  Lymphadenopathy:    She has no cervical adenopathy.          Assessment & Plan:

## 2015-05-04 NOTE — Assessment & Plan Note (Signed)
Still seems to be viral despite the time course Will write her out of work Cough syrup to sleep Start z-pak if worsens despite this

## 2015-05-04 NOTE — Patient Instructions (Signed)
Please start the antibiotic if you are worsening in the next few days. 

## 2015-05-05 ENCOUNTER — Telehealth: Payer: Self-pay

## 2015-05-05 NOTE — Telephone Encounter (Signed)
Spoke with patient and advised results   

## 2015-05-05 NOTE — Telephone Encounter (Signed)
I recommend no more than 3 doses a day

## 2015-05-05 NOTE — Telephone Encounter (Signed)
Pt left v/m; pt seen 05/04/15; pt understood Dr Silvio Pate to say if pt not driving can take Hycodan any time. Pt was taking Hycodan q6h. Last night pt was dizzy and did not take Hycodan at bedtime for fear she was taking too much. Pt request cb with clarification how often pt can take Hycodan. Med list instructions has 5 ml at hs prn for cough.Please advise.

## 2015-08-15 ENCOUNTER — Ambulatory Visit (INDEPENDENT_AMBULATORY_CARE_PROVIDER_SITE_OTHER): Payer: 59 | Admitting: Internal Medicine

## 2015-08-15 DIAGNOSIS — Z0000 Encounter for general adult medical examination without abnormal findings: Secondary | ICD-10-CM

## 2015-08-15 NOTE — Progress Notes (Signed)
Pre visit review using our clinic review tool, if applicable. No additional management support is needed unless otherwise documented below in the visit note. 

## 2015-08-16 NOTE — Assessment & Plan Note (Signed)
rescheduled

## 2015-08-16 NOTE — Progress Notes (Signed)
   Subjective:    Patient ID: Sabrina [name], female    DOB: 06/22/1955, 60 y.o.   MRN: [id number]  HPI  appt rescheduled  Review of Systems     Objective:   Physical Exam        Assessment & Plan:

## 2015-08-23 ENCOUNTER — Encounter: Payer: 59 | Admitting: Internal Medicine

## 2015-09-14 ENCOUNTER — Encounter: Payer: Self-pay | Admitting: Internal Medicine

## 2015-09-14 ENCOUNTER — Ambulatory Visit (INDEPENDENT_AMBULATORY_CARE_PROVIDER_SITE_OTHER): Payer: 59 | Admitting: Internal Medicine

## 2015-09-14 VITALS — BP 130/90 | HR 72 | Temp 97.6°F | Ht 64.5 in | Wt 255.0 lb

## 2015-09-14 DIAGNOSIS — E039 Hypothyroidism, unspecified: Secondary | ICD-10-CM | POA: Diagnosis not present

## 2015-09-14 DIAGNOSIS — Z0000 Encounter for general adult medical examination without abnormal findings: Secondary | ICD-10-CM

## 2015-09-14 DIAGNOSIS — I872 Venous insufficiency (chronic) (peripheral): Secondary | ICD-10-CM | POA: Diagnosis not present

## 2015-09-14 MED ORDER — FUROSEMIDE 80 MG PO TABS: 80.0000 mg | ORAL_TABLET | Freq: Every day | ORAL | Status: DC

## 2015-09-14 MED ORDER — LEVOTHYROXINE SODIUM 50 MCG PO TABS: 50.0000 ug | ORAL_TABLET | Freq: Every day | ORAL | Status: DC

## 2015-09-14 MED ORDER — OMEPRAZOLE 20 MG PO CPDR: 20.0000 mg | DELAYED_RELEASE_CAPSULE | Freq: Every day | ORAL | Status: DC

## 2015-09-14 NOTE — Assessment & Plan Note (Signed)
Doesn't like hose Has the furosemide for prn

## 2015-09-14 NOTE — Patient Instructions (Signed)
DASH Eating Plan  DASH stands for "Dietary Approaches to Stop Hypertension." The DASH eating plan is a healthy eating plan that has been shown to reduce high blood pressure (hypertension). Additional health benefits may include reducing the risk of type 2 diabetes mellitus, heart disease, and stroke. The DASH eating plan may also help with weight loss.  WHAT DO I NEED TO KNOW ABOUT THE DASH EATING PLAN?  For the DASH eating plan, you will follow these general guidelines:  · Choose foods with a percent daily value for sodium of less than 5% (as listed on the food label).  · Use salt-free seasonings or herbs instead of table salt or sea salt.  · Check with your health care provider or pharmacist before using salt substitutes.  · Eat lower-sodium products, often labeled as "lower sodium" or "no salt added."  · Eat fresh foods.  · Eat more vegetables, fruits, and low-fat dairy products.  · Choose whole grains. Look for the word "whole" as the first word in the ingredient list.  · Choose fish and skinless chicken or turkey more often than red meat. Limit fish, poultry, and meat to 6 oz (170 g) each day.  · Limit sweets, desserts, sugars, and sugary drinks.  · Choose heart-healthy fats.  · Limit cheese to 1 oz (28 g) per day.  · Eat more home-cooked food and less restaurant, buffet, and fast food.  · Limit fried foods.  · Cook foods using methods other than frying.  · Limit canned vegetables. If you do use them, rinse them well to decrease the sodium.  · When eating at a restaurant, ask that your food be prepared with less salt, or no salt if possible.  WHAT FOODS CAN I EAT?  Seek help from a dietitian for individual calorie needs.  Grains  Whole grain or whole wheat bread. Brown rice. Whole grain or whole wheat pasta. Quinoa, bulgur, and whole grain cereals. Low-sodium cereals. Corn or whole wheat flour tortillas. Whole grain cornbread. Whole grain crackers. Low-sodium crackers.  Vegetables  Fresh or frozen vegetables  (raw, steamed, roasted, or grilled). Low-sodium or reduced-sodium tomato and vegetable juices. Low-sodium or reduced-sodium tomato sauce and paste. Low-sodium or reduced-sodium canned vegetables.   Fruits  All fresh, canned (in natural juice), or frozen fruits.  Meat and Other Protein Products  Ground beef (85% or leaner), grass-fed beef, or beef trimmed of fat. Skinless chicken or turkey. Ground chicken or turkey. Pork trimmed of fat. All fish and seafood. Eggs. Dried beans, peas, or lentils. Unsalted nuts and seeds. Unsalted canned beans.  Dairy  Low-fat dairy products, such as skim or 1% milk, 2% or reduced-fat cheeses, low-fat ricotta or cottage cheese, or plain low-fat yogurt. Low-sodium or reduced-sodium cheeses.  Fats and Oils  Tub margarines without trans fats. Light or reduced-fat mayonnaise and salad dressings (reduced sodium). Avocado. Safflower, olive, or canola oils. Natural peanut or almond butter.  Other  Unsalted popcorn and pretzels.  The items listed above may not be a complete list of recommended foods or beverages. Contact your dietitian for more options.  WHAT FOODS ARE NOT RECOMMENDED?  Grains  White bread. White pasta. White rice. Refined cornbread. Bagels and croissants. Crackers that contain trans fat.  Vegetables  Creamed or fried vegetables. Vegetables in a cheese sauce. Regular canned vegetables. Regular canned tomato sauce and paste. Regular tomato and vegetable juices.  Fruits  Dried fruits. Canned fruit in light or heavy syrup. Fruit juice.  Meat and Other Protein   Products  Fatty cuts of meat. Ribs, chicken wings, bacon, sausage, bologna, salami, chitterlings, fatback, hot dogs, bratwurst, and packaged luncheon meats. Salted nuts and seeds. Canned beans with salt.  Dairy  Whole or 2% milk, cream, half-and-half, and cream cheese. Whole-fat or sweetened yogurt. Full-fat cheeses or blue cheese. Nondairy creamers and whipped toppings. Processed cheese, cheese spreads, or cheese  curds.  Condiments  Onion and garlic salt, seasoned salt, table salt, and sea salt. Canned and packaged gravies. Worcestershire sauce. Tartar sauce. Barbecue sauce. Teriyaki sauce. Soy sauce, including reduced sodium. Steak sauce. Fish sauce. Oyster sauce. Cocktail sauce. Horseradish. Ketchup and mustard. Meat flavorings and tenderizers. Bouillon cubes. Hot sauce. Tabasco sauce. Marinades. Taco seasonings. Relishes.  Fats and Oils  Butter, stick margarine, lard, shortening, ghee, and bacon fat. Coconut, palm kernel, or palm oils. Regular salad dressings.  Other  Pickles and olives. Salted popcorn and pretzels.  The items listed above may not be a complete list of foods and beverages to avoid. Contact your dietitian for more information.  WHERE CAN I FIND MORE INFORMATION?  National Heart, Lung, and Blood Institute: www.nhlbi.nih.gov/health/health-topics/topics/dash/     This information is not intended to replace advice given to you by your health care provider. Make sure you discuss any questions you have with your health care provider.     Document Released: 03/29/2011 Document Revised: 04/30/2014 Document Reviewed: 02/11/2013  Elsevier Interactive Patient Education ©2016 Elsevier Inc.

## 2015-09-14 NOTE — Assessment & Plan Note (Signed)
Healthy but not great shape Will just check labs Colon due 2020 Just had mammo, etc with Dr Bary Castilla No pap due to hyster Prefers flu vaccine

## 2015-09-14 NOTE — Assessment & Plan Note (Signed)
Seems euthyroid ?Will check labs ?

## 2015-09-14 NOTE — Progress Notes (Signed)
Subjective:    Patient ID: Sabrina [name], female    DOB: [birth year], 60 y.o.   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 furosemide (LASIX) 80 MG tablet Take 1 tablet (80 mg total) by mouth daily. 90 tablet 3  . levothyroxine (SYNTHROID, LEVOTHROID) 50 MCG tablet Take 1 tablet (50 mcg total) by mouth daily. 90 tablet 3  . omeprazole (PRILOSEC) 20 MG capsule Take 1 capsule (20 mg total) by mouth daily. 90 capsule 3   No current facility-administered medications on file prior to visit.    No Known Allergies  Past Medical History  Diagnosis Date  . Viral meningitis 5/08    [facility]  . Hypothyroidism 2008  . GERD (gastroesophageal reflux disease)   . Allergic rhinitis   . Obstructive sleep apnea     Past Surgical History  Procedure Laterality Date  . Knee arthroscopy w/ laser  8/07    Left  . Appendectomy      teenager  . Tonsillectomy      as child  . Vesicovaginal fistula closure w/ tah  1987    Fibroids  . Abdominal hysterectomy  1982    Dr Vernie Ammons assisted by Dr Bary Castilla  . Breast biopsy Left 1999    [facility]    Family History  Problem Relation Age of Onset  . Coronary artery disease Neg Hx   . Diabetes Neg Hx   . Hypertension      both sides  . Cancer      maternal side  . Prostate cancer Maternal Grandfather   . Breast cancer Maternal  Aunt   . Cancer Maternal Aunt     breast cancer  . Cancer Mother     mastectomy for breast cancer  . Cancer Paternal Uncle     pancreatic cancer    Social History   Social History  . Marital Status: Married    Spouse Name: N/A  . Number of Children: N/A  . Years of Education: N/A   Occupational History  . 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  . Caregiver --part time     [employer]   Social History Main Topics  . Smoking status: Never Smoker   . Smokeless tobacco: Never Used  . Alcohol Use: Yes     Comment: beer on weekends  . Drug Use: Not on file  . Sexual Activity: Not on file   Other Topics Concern  . Not on file   Social History Narrative   From [place].   Moved to [place] in high school then back here.     Review of Systems  Constitutional: Negative for fatigue and unexpected weight change.       Tries to walk during work breaks Wears seat belt  HENT: Negative for dental problem, hearing loss, tinnitus and trouble swallowing.  Keeps up with the dentist  Eyes: Negative for visual disturbance.       No diplopia or unilateral vision loss  Respiratory: Negative for cough, chest tightness and shortness of breath.   Cardiovascular: Positive for leg swelling. Negative for chest pain and palpitations.  Gastrointestinal: Negative for nausea, abdominal pain, constipation and blood in stool.  Endocrine: Negative for polydipsia and polyuria.  Genitourinary: Positive for frequency. Negative for dysuria, hematuria and dyspareunia.  Musculoskeletal: Positive for arthralgias. Negative for back pain and joint swelling.       Some knee pain--has had cortisone shots  Skin: Positive for rash.  Allergic/Immunologic: Positive for environmental allergies. Negative for immunocompromised state.       Rarely takes claritin  Neurological: Positive for headaches. Negative for dizziness, syncope, weakness and light-headedness.       Rare headaches with stress  Hematological:  Negative for adenopathy. Bruises/bleeds easily.  Psychiatric/Behavioral: Negative for sleep disturbance and dysphoric mood. The patient is not nervous/anxious.        Objective:   Physical Exam  Constitutional: She is oriented to person, place, and time. She appears well-developed and well-nourished. No distress.  HENT:  Head: Normocephalic and atraumatic.  Right Ear: External ear normal.  Left Ear: External ear normal.  Mouth/Throat: Oropharynx is clear and moist. No oropharyngeal exudate.  Eyes: Conjunctivae are normal. Pupils are equal, round, and reactive to light.  Neck: Normal range of motion. Neck supple. No thyromegaly present.  Cardiovascular: Normal rate, regular rhythm, normal heart sounds and intact distal pulses.  Exam reveals no gallop.   No murmur heard. Pulmonary/Chest: Effort normal and breath sounds normal. No respiratory distress. She has no wheezes. She has no rales.  Abdominal: Soft. There is no tenderness.  Musculoskeletal: She exhibits no tenderness.  2+ non pitting edema  Lymphadenopathy:    She has no cervical adenopathy.  Neurological: She is alert and oriented to person, place, and time.  Skin: No rash noted. No erythema.  Psychiatric: She has a normal mood and affect. Her behavior is normal.          Assessment & Plan:

## 2015-09-14 NOTE — Addendum Note (Signed)
Addended by: Marchia Bond on: 09/14/2015 04:12 PM   Modules accepted: Miquel Dunn

## 2015-09-14 NOTE — Progress Notes (Signed)
Pre visit review using our clinic review tool, if applicable. No additional management support is needed unless otherwise documented below in the visit note. 

## 2015-09-15 LAB — CBC WITH DIFFERENTIAL/PLATELET
Basophils Absolute: 0 10*3/uL (ref 0.0–0.2)
Basos: 0 %
EOS (ABSOLUTE): 0.1 10*3/uL (ref 0.0–0.4)
EOS: 1 %
HEMATOCRIT: 39.9 % (ref 34.0–46.6)
Hemoglobin: 13.3 g/dL (ref 11.1–15.9)
IMMATURE GRANULOCYTES: 0 %
Immature Grans (Abs): 0 10*3/uL (ref 0.0–0.1)
Lymphocytes Absolute: 2.9 10*3/uL (ref 0.7–3.1)
Lymphs: 33 %
MCH: 30 pg (ref 26.6–33.0)
MCHC: 33.3 g/dL (ref 31.5–35.7)
MCV: 90 fL (ref 79–97)
MONOS ABS: 0.5 10*3/uL (ref 0.1–0.9)
Monocytes: 6 %
NEUTROS PCT: 60 %
Neutrophils Absolute: 5.2 10*3/uL (ref 1.4–7.0)
PLATELETS: 286 10*3/uL (ref 150–379)
RBC: 4.44 x10E6/uL (ref 3.77–5.28)
RDW: 13.4 % (ref 12.3–15.4)
WBC: 8.7 10*3/uL (ref 3.4–10.8)

## 2015-09-15 LAB — COMPREHENSIVE METABOLIC PANEL
ALK PHOS: 82 IU/L (ref 39–117)
ALT: 19 IU/L (ref 0–32)
AST: 15 IU/L (ref 0–40)
Albumin/Globulin Ratio: 1.5 (ref 1.2–2.2)
Albumin: 4.4 g/dL (ref 3.5–5.5)
BUN/Creatinine Ratio: 16 (ref 9–23)
BUN: 13 mg/dL (ref 6–24)
Bilirubin Total: 0.5 mg/dL (ref 0.0–1.2)
CO2: 24 mmol/L (ref 18–29)
CREATININE: 0.81 mg/dL (ref 0.57–1.00)
Calcium: 9.5 mg/dL (ref 8.7–10.2)
Chloride: 101 mmol/L (ref 96–106)
GFR calc Af Amer: 93 mL/min/{1.73_m2} (ref >59)
GFR calc non Af Amer: 81 mL/min/{1.73_m2} (ref >59)
GLUCOSE: 105 mg/dL — AB (ref 65–99)
Globulin, Total: 2.9 g/dL (ref 1.5–4.5)
Potassium: 4.6 mmol/L (ref 3.5–5.2)
SODIUM: 142 mmol/L (ref 134–144)
Total Protein: 7.3 g/dL (ref 6.0–8.5)

## 2015-09-15 LAB — LIPID PANEL
CHOLESTEROL TOTAL: 185 mg/dL (ref 100–199)
Chol/HDL Ratio: 3.6 ratio units (ref 0.0–4.4)
HDL: 52 mg/dL (ref >39)
LDL Calculated: 120 mg/dL — ABNORMAL HIGH (ref 0–99)
Triglycerides: 63 mg/dL (ref 0–149)
VLDL Cholesterol Cal: 13 mg/dL (ref 5–40)

## 2015-09-15 LAB — TSH: TSH: 4.1 u[IU]/mL (ref 0.450–4.500)

## 2015-09-15 LAB — T4, FREE: FREE T4: 1.14 ng/dL (ref 0.82–1.77)

## 2016-01-24 ENCOUNTER — Telehealth: Payer: Self-pay | Admitting: Internal Medicine

## 2016-01-24 NOTE — Telephone Encounter (Signed)
Pt stopped by to let us know that she is having more trouble with her knee. She has had an mri and will inform you of the results. No call back needed  Thanks

## 2016-01-24 NOTE — Telephone Encounter (Signed)
Patient notified form is ready for pick up. °

## 2016-01-24 NOTE — Telephone Encounter (Signed)
Pt dropped off ppw for work. Please call [phone] when ready to pick up. Placing in rx tower. Thanks

## 2016-01-24 NOTE — Telephone Encounter (Signed)
Form done No charge 

## 2016-01-24 NOTE — Telephone Encounter (Signed)
[employer] Appeal Form placed in Dr Alla German InBox on his desk

## 2016-01-24 NOTE — Telephone Encounter (Signed)
Will await the MRI report

## 2016-05-30 ENCOUNTER — Other Ambulatory Visit: Payer: Self-pay | Admitting: Orthopedic Surgery

## 2016-06-13 NOTE — Pre-Procedure Instructions (Signed)
Sabrina [name]  06/13/2016      [address] [place] [address] Phone: [phone] Fax: [phone]  [facility] [place], [place] - [address] [place] [address] Phone: [phone] Fax: [phone]    Your procedure is scheduled on March 6  Report to [facility] at [place].M.  Call this number if you have problems the morning of surgery:  [phone]   Remember:  Do not eat food or drink liquids after midnight.   Take these medicines the morning of surgery with A SIP OF WATER acetaminophen (TYLENOL) , levothyroxine (SYNTHROID, LEVOTHROID), omeprazole (PRILOSEC)   Do not wear jewelry, make-up or nail polish.  Do not wear lotions, powders, or perfumes, or deoderant.  Do not shave 48 hours prior to surgery.  Men may shave face and neck.  Do not bring valuables to the hospital.  [facility] is not responsible for any belongings or valuables.  Contacts, dentures or bridgework may not be worn into surgery.  Leave your suitcase in the car.  After surgery it may be brought to your room.  For patients admitted to the hospital, discharge time will be determined by your treatment team.  Patients discharged the day of surgery will not be allowed to drive home.    Special instructions:   [facility]- Preparing For Surgery  Before surgery, you can play an important role. Because skin is not sterile, your skin needs to be as free of germs as possible. You can reduce the number of germs on your skin by washing with CHG (chlorahexidine gluconate) Soap before surgery.  CHG is an antiseptic cleaner which kills germs and bonds with the skin to continue killing germs even after washing.  Please do not use if you have an allergy to CHG or antibacterial soaps. If your skin becomes reddened/irritated stop  using the CHG.  Do not shave (including legs and underarms) for at least 48 hours prior to first CHG shower. It is OK to shave your face.  Please follow these instructions carefully.   1. Shower the NIGHT BEFORE SURGERY and the MORNING OF SURGERY with CHG.   2. If you chose to wash your hair, wash your hair first as usual with your normal shampoo.  3. After you shampoo, rinse your hair and body thoroughly to remove the shampoo.  4. Use CHG as you would any other liquid soap. You can apply CHG directly to the skin and wash gently with a scrungie or a clean washcloth.   5. Apply the CHG Soap to your body ONLY FROM THE NECK DOWN.  Do not use on open wounds or open sores. Avoid contact with your eyes, ears, mouth and genitals (private parts). Wash genitals (private parts) with your normal soap.  6. Wash thoroughly, paying special attention to the area where your surgery will be performed.  7. Thoroughly rinse your body with warm water from the neck down.  8. DO NOT shower/wash with your normal soap after using and rinsing off the CHG Soap.  9. Pat yourself dry with a CLEAN TOWEL.   10. Wear CLEAN PAJAMAS   11. Place CLEAN SHEETS on your bed the night of your first shower and DO NOT SLEEP WITH PETS.    Day of Surgery: Do not apply any deodorants/lotions.  Please wear clean clothes to the hospital/surgery center.      Please read over the following fact sheets that you were given.

## 2016-06-14 ENCOUNTER — Encounter (HOSPITAL_COMMUNITY)
Admission: RE | Admit: 2016-06-14 | Discharge: 2016-06-14 | Disposition: A | Discharge status: Home / Self Care | Payer: 59 | Source: Ambulatory Visit | Attending: Orthopedic Surgery | Admitting: Orthopedic Surgery

## 2016-06-14 ENCOUNTER — Encounter (HOSPITAL_COMMUNITY): Payer: Self-pay

## 2016-06-14 DIAGNOSIS — Z01812 Encounter for preprocedural laboratory examination: Secondary | ICD-10-CM | POA: Diagnosis present

## 2016-06-14 DIAGNOSIS — Z01810 Encounter for preprocedural cardiovascular examination: Secondary | ICD-10-CM | POA: Diagnosis not present

## 2016-06-14 DIAGNOSIS — M1711 Unilateral primary osteoarthritis, right knee: Secondary | ICD-10-CM | POA: Insufficient documentation

## 2016-06-14 HISTORY — DX: Unspecified osteoarthritis, unspecified site: M19.90

## 2016-06-14 LAB — CBC
HEMATOCRIT: 39.9 % (ref 36.0–46.0)
Hemoglobin: 13.1 g/dL (ref 12.0–15.0)
MCH: 29.6 pg (ref 26.0–34.0)
MCHC: 32.8 g/dL (ref 30.0–36.0)
MCV: 90.3 fL (ref 78.0–100.0)
Platelets: 293 10*3/uL (ref 150–400)
RBC: 4.42 MIL/uL (ref 3.87–5.11)
RDW: 12.9 % (ref 11.5–15.5)
WBC: 9.5 10*3/uL (ref 4.0–10.5)

## 2016-06-14 LAB — BASIC METABOLIC PANEL
ANION GAP: 6 (ref 5–15)
BUN: 8 mg/dL (ref 6–20)
CALCIUM: 9.2 mg/dL (ref 8.9–10.3)
CHLORIDE: 106 mmol/L (ref 101–111)
CO2: 28 mmol/L (ref 22–32)
CREATININE: 0.73 mg/dL (ref 0.44–1.00)
GFR calc Af Amer: >60 mL/min (ref >60)
GFR calc non Af Amer: >60 mL/min (ref >60)
GLUCOSE: 102 mg/dL — AB (ref 65–99)
Potassium: 3.9 mmol/L (ref 3.5–5.1)
Sodium: 140 mmol/L (ref 135–145)

## 2016-06-14 LAB — SURGICAL PCR SCREEN
MRSA, PCR: NEGATIVE
Staphylococcus aureus: NEGATIVE

## 2016-06-14 NOTE — Progress Notes (Signed)
PATIENT HAS APPT. Monday 2/26, TO SEE DR. Silvio Pate FOR MEDICAL CLEARANCE.

## 2016-06-15 ENCOUNTER — Other Ambulatory Visit (HOSPITAL_COMMUNITY): Payer: 59

## 2016-06-18 ENCOUNTER — Ambulatory Visit (INDEPENDENT_AMBULATORY_CARE_PROVIDER_SITE_OTHER): Payer: 59 | Admitting: Internal Medicine

## 2016-06-18 ENCOUNTER — Encounter: Payer: Self-pay | Admitting: Internal Medicine

## 2016-06-18 ENCOUNTER — Encounter (INDEPENDENT_AMBULATORY_CARE_PROVIDER_SITE_OTHER): Payer: Self-pay

## 2016-06-18 VITALS — BP 132/90 | HR 62 | Temp 98.6°F | Wt 260.0 lb

## 2016-06-18 DIAGNOSIS — Z01818 Encounter for other preprocedural examination: Secondary | ICD-10-CM | POA: Diagnosis not present

## 2016-06-18 NOTE — Progress Notes (Signed)
Pre visit review using our clinic review tool, if applicable. No additional management support is needed unless otherwise documented below in the visit note. 

## 2016-06-18 NOTE — Assessment & Plan Note (Addendum)
No cardiorespiratory problems EKG is normal (last week) Can barely step up on table and hard even walking on flat ground No medical contraindications for this clearly needed procedure No special perioperative care is indicated

## 2016-06-18 NOTE — Progress Notes (Signed)
Subjective:    Patient ID: Sabrina [name], female    DOB: 12-17-1955, 61 y.o.   MRN: [id number]  HPI Here for surgical clearance Having right partial knee replacement--- next week (3/6)  Has had severe pain in the knee for some time No chest pain No palpitations No dizziness or syncope Not really able to exercise due to her knee--but no easier fatigue (like walking in store or doing instrumental ADLs) Chronic edema--this is stable  No cough No wheezing  Current Outpatient Prescriptions on File Prior to Visit  Medication Sig Dispense Refill  . acetaminophen (TYLENOL) 500 MG tablet Take 1,000 mg by mouth 3 (three) times daily as needed for moderate pain.    . furosemide (LASIX) 80 MG tablet Take 1 tablet (80 mg total) by mouth daily. 90 tablet 3  . levothyroxine (SYNTHROID, LEVOTHROID) 50 MCG tablet Take 1 tablet (50 mcg total) by mouth daily. 90 tablet 3  . omeprazole (PRILOSEC) 20 MG capsule Take 1 capsule (20 mg total) by mouth daily. 90 capsule 3   No current facility-administered medications on file prior to visit.     No Known Allergies  Past Medical History:  Diagnosis Date  . Allergic rhinitis   . Arthritis   . GERD (gastroesophageal reflux disease)   . Hypothyroidism 2008  . Obstructive sleep apnea    CPAP   --   NOT ABLE TO USE   . Viral meningitis 5/08   [facility]    Past Surgical History:  Procedure Laterality Date  . ABDOMINAL HYSTERECTOMY  1982   Dr Vernie Ammons assisted by Dr Bary Castilla  . APPENDECTOMY     teenager  . BREAST BIOPSY Left 1999   [facility]  Marland Kitchen KNEE ARTHROSCOPY W/ LASER  8/07   Left  . TONSILLECTOMY     as child  . VESICOVAGINAL FISTULA CLOSURE W/ TAH  1987   Fibroids    Family History  Problem Relation Age of Onset  . Cancer Mother     mastectomy for breast cancer  . Cancer Paternal Uncle     pancreatic cancer  . Hypertension      both sides  . Cancer      maternal side  . Prostate cancer Maternal Grandfather   . Breast cancer  Maternal Aunt   . Cancer Maternal Aunt     breast cancer  . Coronary artery disease Neg Hx   . Diabetes Neg Hx     Social History   Social History  . Marital status: Married    Spouse name: N/A  . Number of children: N/A  . Years of education: N/A   Occupational History  . 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  . Caregiver --part time     [employer]   Social History Main Topics  . Smoking status: Never Smoker  . Smokeless tobacco: Never Used  . Alcohol use Yes     Comment: beer on weekends  . Drug use: No  . Sexual activity: Not on file   Other Topics Concern  . Not on file   Social History Narrative   From [place].   Moved to [place] in high school then back here.     Review of Systems  Usually sleeps okay--if the knee pain doesn't bother her Appetite is fine Weight stable     Objective:   Physical Exam  Constitutional: She appears well-developed and well-nourished. No distress.  Neck: Normal range of motion. Neck supple. No  thyromegaly present.  Cardiovascular: Normal rate, regular rhythm, normal heart sounds and intact distal pulses.  Exam reveals no gallop.   No murmur heard. Pulmonary/Chest: Effort normal and breath sounds normal. No respiratory distress. She has no wheezes. She has no rales.  Abdominal: Soft. There is no tenderness.  Musculoskeletal:  2+ non pitting edema in calves (no change) but not feet. Needs help to step onto table. Very antalgic gait--hard to walk  Lymphadenopathy:    She has no cervical adenopathy.  Skin: No rash noted.  Psychiatric: She has a normal mood and affect. Her behavior is normal.          Assessment & Plan:

## 2016-06-21 ENCOUNTER — Encounter (HOSPITAL_COMMUNITY): Payer: Self-pay | Admitting: *Deleted

## 2016-06-22 ENCOUNTER — Ambulatory Visit: Payer: 59 | Admitting: Obstetrics & Gynecology

## 2016-06-26 ENCOUNTER — Ambulatory Visit (HOSPITAL_BASED_OUTPATIENT_CLINIC_OR_DEPARTMENT_OTHER)
Admission: RE | Admit: 2016-06-26 | Discharge: 2016-06-27 | Disposition: A | Discharge status: Home / Self Care | Payer: 59 | Source: Ambulatory Visit | Attending: Orthopedic Surgery | Admitting: Orthopedic Surgery

## 2016-06-26 ENCOUNTER — Encounter (HOSPITAL_BASED_OUTPATIENT_CLINIC_OR_DEPARTMENT_OTHER): Payer: Self-pay

## 2016-06-26 ENCOUNTER — Ambulatory Visit (HOSPITAL_BASED_OUTPATIENT_CLINIC_OR_DEPARTMENT_OTHER): Payer: 59 | Admitting: Anesthesiology

## 2016-06-26 ENCOUNTER — Encounter: Payer: Self-pay | Admitting: Internal Medicine

## 2016-06-26 ENCOUNTER — Encounter (HOSPITAL_BASED_OUTPATIENT_CLINIC_OR_DEPARTMENT_OTHER)
Admission: RE | Disposition: A | Discharge status: Home / Self Care | Payer: Self-pay | Source: Ambulatory Visit | Attending: Orthopedic Surgery

## 2016-06-26 DIAGNOSIS — G4733 Obstructive sleep apnea (adult) (pediatric): Secondary | ICD-10-CM

## 2016-06-26 DIAGNOSIS — Z9119 Patient's noncompliance with other medical treatment and regimen: Secondary | ICD-10-CM | POA: Diagnosis not present

## 2016-06-26 DIAGNOSIS — M1711 Unilateral primary osteoarthritis, right knee: Secondary | ICD-10-CM | POA: Diagnosis not present

## 2016-06-26 DIAGNOSIS — X58XXXA Exposure to other specified factors, initial encounter: Secondary | ICD-10-CM | POA: Insufficient documentation

## 2016-06-26 DIAGNOSIS — S83241A Other tear of medial meniscus, current injury, right knee, initial encounter: Secondary | ICD-10-CM | POA: Diagnosis not present

## 2016-06-26 DIAGNOSIS — Z79899 Other long term (current) drug therapy: Secondary | ICD-10-CM | POA: Insufficient documentation

## 2016-06-26 DIAGNOSIS — S83231A Complex tear of medial meniscus, current injury, right knee, initial encounter: Secondary | ICD-10-CM | POA: Diagnosis present

## 2016-06-26 DIAGNOSIS — E039 Hypothyroidism, unspecified: Secondary | ICD-10-CM | POA: Diagnosis not present

## 2016-06-26 DIAGNOSIS — K219 Gastro-esophageal reflux disease without esophagitis: Secondary | ICD-10-CM | POA: Insufficient documentation

## 2016-06-26 HISTORY — DX: Complex tear of medial meniscus, current injury, right knee, initial encounter: S83.231A

## 2016-06-26 HISTORY — PX: KNEE ARTHROSCOPY WITH MEDIAL MENISECTOMY: SHX5651

## 2016-06-26 HISTORY — PX: CHONDROPLASTY: SHX5177

## 2016-06-26 SURGERY — ARTHROSCOPY, KNEE, WITH MEDIAL MENISCECTOMY
Anesthesia: General | Site: Knee | Laterality: Right

## 2016-06-26 MED ORDER — METOCLOPRAMIDE HCL 5 MG/ML IJ SOLN: 5.0000 mg | Freq: Three times a day (TID) | INTRAMUSCULAR | Status: DC | PRN

## 2016-06-26 MED ORDER — BACLOFEN 10 MG PO TABS: 10.0000 mg | ORAL_TABLET | Freq: Three times a day (TID) | ORAL | 0 refills | Status: DC

## 2016-06-26 MED ORDER — MAGNESIUM CITRATE PO SOLN: 1.0000 | Freq: Once | ORAL | Status: DC | PRN

## 2016-06-26 MED ORDER — SCOPOLAMINE 1 MG/3DAYS TD PT72
MEDICATED_PATCH | TRANSDERMAL | Status: DC | PRN
  Administered 2016-06-26: 1 via TRANSDERMAL

## 2016-06-26 MED ORDER — ONDANSETRON HCL 4 MG/2ML IJ SOLN
INTRAMUSCULAR | Status: AC
  Filled 2016-06-26: qty 2

## 2016-06-26 MED ORDER — ONDANSETRON HCL 4 MG PO TABS: 4.0000 mg | ORAL_TABLET | Freq: Three times a day (TID) | ORAL | 0 refills | Status: DC | PRN

## 2016-06-26 MED ORDER — POLYETHYLENE GLYCOL 3350 17 G PO PACK: 17.0000 g | PACK | Freq: Every day | ORAL | Status: DC | PRN

## 2016-06-26 MED ORDER — MIDAZOLAM HCL 2 MG/2ML IJ SOLN
INTRAMUSCULAR | Status: AC
  Filled 2016-06-26: qty 2

## 2016-06-26 MED ORDER — SENNA 8.6 MG PO TABS
1.0000 | ORAL_TABLET | Freq: Two times a day (BID) | ORAL | Status: DC
  Administered 2016-06-26: 8.6 mg via ORAL
  Filled 2016-06-26: qty 1

## 2016-06-26 MED ORDER — LIDOCAINE 2% (20 MG/ML) 5 ML SYRINGE
INTRAMUSCULAR | Status: AC
  Filled 2016-06-26: qty 5

## 2016-06-26 MED ORDER — DEXAMETHASONE SODIUM PHOSPHATE 10 MG/ML IJ SOLN
INTRAMUSCULAR | Status: AC
Start: 2016-06-26 — End: 2016-06-26
  Filled 2016-06-26: qty 1

## 2016-06-26 MED ORDER — HYDROMORPHONE HCL 1 MG/ML IJ SOLN
0.5000 mg | INTRAMUSCULAR | Status: DC | PRN
  Administered 2016-06-26: 0.5 mg via INTRAVENOUS
  Filled 2016-06-26: qty 1

## 2016-06-26 MED ORDER — SENNA-DOCUSATE SODIUM 8.6-50 MG PO TABS: 2.0000 | ORAL_TABLET | Freq: Every day | ORAL | 1 refills | Status: DC

## 2016-06-26 MED ORDER — BUPIVACAINE HCL (PF) 0.25 % IJ SOLN
INTRAMUSCULAR | Status: AC
  Filled 2016-06-26: qty 30

## 2016-06-26 MED ORDER — MIDAZOLAM HCL 5 MG/5ML IJ SOLN
INTRAMUSCULAR | Status: DC | PRN
  Administered 2016-06-26: 2 mg via INTRAVENOUS

## 2016-06-26 MED ORDER — LIDOCAINE 2% (20 MG/ML) 5 ML SYRINGE
INTRAMUSCULAR | Status: DC | PRN
  Administered 2016-06-26: 50 mg via INTRAVENOUS

## 2016-06-26 MED ORDER — PANTOPRAZOLE SODIUM 40 MG PO TBEC: 80.0000 mg | DELAYED_RELEASE_TABLET | Freq: Every day | ORAL | Status: DC

## 2016-06-26 MED ORDER — ONDANSETRON HCL 4 MG/2ML IJ SOLN
4.0000 mg | Freq: Once | INTRAMUSCULAR | Status: AC
  Administered 2016-06-26: 4 mg via INTRAVENOUS

## 2016-06-26 MED ORDER — OXYCODONE-ACETAMINOPHEN 5-325 MG PO TABS
1.0000 | ORAL_TABLET | ORAL | Status: DC | PRN
  Administered 2016-06-26 – 2016-06-27 (×3): 2 via ORAL
  Filled 2016-06-26 (×3): qty 2

## 2016-06-26 MED ORDER — CEFAZOLIN SODIUM-DEXTROSE 2-4 GM/100ML-% IV SOLN
2.0000 g | INTRAVENOUS | Status: AC
  Administered 2016-06-26: 2 g via INTRAVENOUS

## 2016-06-26 MED ORDER — PROPOFOL 10 MG/ML IV BOLUS
INTRAVENOUS | Status: AC
  Filled 2016-06-26: qty 20

## 2016-06-26 MED ORDER — METHOCARBAMOL 500 MG PO TABS
500.0000 mg | ORAL_TABLET | Freq: Four times a day (QID) | ORAL | Status: DC | PRN
Start: 2016-06-26 — End: 2016-06-27
  Administered 2016-06-26 – 2016-06-27 (×2): 500 mg via ORAL
  Filled 2016-06-26 (×2): qty 1

## 2016-06-26 MED ORDER — ONDANSETRON HCL 4 MG/2ML IJ SOLN: 4.0000 mg | Freq: Four times a day (QID) | INTRAMUSCULAR | Status: DC | PRN

## 2016-06-26 MED ORDER — FENTANYL CITRATE (PF) 100 MCG/2ML IJ SOLN
25.0000 ug | INTRAMUSCULAR | Status: DC | PRN
  Administered 2016-06-26: 50 ug via INTRAVENOUS
  Administered 2016-06-26 (×2): 25 ug via INTRAVENOUS

## 2016-06-26 MED ORDER — SODIUM CHLORIDE 0.9 % IR SOLN
Status: DC | PRN
  Administered 2016-06-26: 3000 mL

## 2016-06-26 MED ORDER — PROMETHAZINE HCL 25 MG/ML IJ SOLN: 6.2500 mg | INTRAMUSCULAR | Status: DC | PRN

## 2016-06-26 MED ORDER — OXYCODONE-ACETAMINOPHEN 10-325 MG PO TABS: 1.0000 | ORAL_TABLET | Freq: Four times a day (QID) | ORAL | 0 refills | Status: DC | PRN

## 2016-06-26 MED ORDER — SCOPOLAMINE 1 MG/3DAYS TD PT72
MEDICATED_PATCH | TRANSDERMAL | Status: AC
  Filled 2016-06-26: qty 1

## 2016-06-26 MED ORDER — ZOLPIDEM TARTRATE 5 MG PO TABS: 5.0000 mg | ORAL_TABLET | Freq: Every evening | ORAL | Status: DC | PRN

## 2016-06-26 MED ORDER — FENTANYL CITRATE (PF) 100 MCG/2ML IJ SOLN
INTRAMUSCULAR | Status: AC
  Filled 2016-06-26: qty 2

## 2016-06-26 MED ORDER — LEVOTHYROXINE SODIUM 50 MCG PO TABS: 50.0000 ug | ORAL_TABLET | Freq: Every day | ORAL | Status: DC

## 2016-06-26 MED ORDER — MIDAZOLAM HCL 2 MG/2ML IJ SOLN: 1.0000 mg | INTRAMUSCULAR | Status: DC | PRN

## 2016-06-26 MED ORDER — SCOPOLAMINE 1 MG/3DAYS TD PT72: 1.0000 | MEDICATED_PATCH | Freq: Once | TRANSDERMAL | Status: DC | PRN

## 2016-06-26 MED ORDER — BUPIVACAINE HCL (PF) 0.5 % IJ SOLN
INTRAMUSCULAR | Status: DC | PRN
  Administered 2016-06-26: 20 mL

## 2016-06-26 MED ORDER — BISACODYL 10 MG RE SUPP: 10.0000 mg | Freq: Every day | RECTAL | Status: DC | PRN

## 2016-06-26 MED ORDER — OXYCODONE HCL 5 MG PO TABS: 5.0000 mg | ORAL_TABLET | ORAL | Status: DC | PRN

## 2016-06-26 MED ORDER — FENTANYL CITRATE (PF) 100 MCG/2ML IJ SOLN
INTRAMUSCULAR | Status: DC | PRN
  Administered 2016-06-26: 100 ug via INTRAVENOUS
  Administered 2016-06-26: 50 ug via INTRAVENOUS
  Administered 2016-06-26: 25 ug via INTRAVENOUS
  Administered 2016-06-26 (×2): 50 ug via INTRAVENOUS
  Administered 2016-06-26: 25 ug via INTRAVENOUS

## 2016-06-26 MED ORDER — DEXAMETHASONE SODIUM PHOSPHATE 4 MG/ML IJ SOLN
INTRAMUSCULAR | Status: DC | PRN
  Administered 2016-06-26: 10 mg via INTRAVENOUS

## 2016-06-26 MED ORDER — CEFAZOLIN SODIUM-DEXTROSE 2-4 GM/100ML-% IV SOLN
INTRAVENOUS | Status: AC
  Filled 2016-06-26: qty 100

## 2016-06-26 MED ORDER — LACTATED RINGERS IV SOLN
INTRAVENOUS | Status: DC
  Administered 2016-06-26 (×2): via INTRAVENOUS

## 2016-06-26 MED ORDER — SODIUM CHLORIDE 0.9 % IV SOLN
INTRAVENOUS | Status: DC
  Administered 2016-06-26: 15:00:00 via INTRAVENOUS

## 2016-06-26 MED ORDER — FENTANYL CITRATE (PF) 100 MCG/2ML IJ SOLN
INTRAMUSCULAR | Status: AC
  Filled 2016-06-26: qty 4

## 2016-06-26 MED ORDER — SUCCINYLCHOLINE CHLORIDE 20 MG/ML IJ SOLN
INTRAMUSCULAR | Status: DC | PRN
  Administered 2016-06-26: 120 mg via INTRAVENOUS

## 2016-06-26 MED ORDER — PROPOFOL 10 MG/ML IV BOLUS
INTRAVENOUS | Status: DC | PRN
  Administered 2016-06-26: 250 mg via INTRAVENOUS

## 2016-06-26 MED ORDER — FENTANYL CITRATE (PF) 100 MCG/2ML IJ SOLN: 50.0000 ug | INTRAMUSCULAR | Status: DC | PRN

## 2016-06-26 MED ORDER — ONDANSETRON HCL 4 MG PO TABS: 4.0000 mg | ORAL_TABLET | Freq: Four times a day (QID) | ORAL | Status: DC | PRN

## 2016-06-26 MED ORDER — DEXTROSE 5 % IV SOLN: 500.0000 mg | Freq: Four times a day (QID) | INTRAVENOUS | Status: DC | PRN

## 2016-06-26 MED ORDER — METOCLOPRAMIDE HCL 5 MG PO TABS: 5.0000 mg | ORAL_TABLET | Freq: Three times a day (TID) | ORAL | Status: DC | PRN

## 2016-06-26 MED ORDER — CEFAZOLIN IN D5W 1 GM/50ML IV SOLN
1.0000 g | Freq: Four times a day (QID) | INTRAVENOUS | Status: AC
  Administered 2016-06-26 – 2016-06-27 (×3): 1 g via INTRAVENOUS
  Filled 2016-06-26 (×3): qty 50

## 2016-06-26 SURGICAL SUPPLY — 39 items
BANDAGE ACE 6X5 VEL STRL LF (GAUZE/BANDAGES/DRESSINGS) ×5 IMPLANT
BANDAGE ESMARK 6X9 LF (GAUZE/BANDAGES/DRESSINGS) IMPLANT
BLADE CUTTER GATOR 3.5 (BLADE) ×3 IMPLANT
BNDG CMPR 9X6 STRL LF SNTH (GAUZE/BANDAGES/DRESSINGS)
BNDG ESMARK 6X9 LF (GAUZE/BANDAGES/DRESSINGS)
CLSR STERI-STRIP ANTIMIC 1/2X4 (GAUZE/BANDAGES/DRESSINGS) ×3 IMPLANT
CUFF TOURNIQUET SINGLE 34IN LL (TOURNIQUET CUFF) IMPLANT
CUTTER KNOT PUSHER 2-0 FIBERWI (INSTRUMENTS) IMPLANT
CUTTER MENISCUS  4.2MM (BLADE)
CUTTER MENISCUS 4.2MM (BLADE) IMPLANT
DRAPE ARTHROSCOPY W/POUCH 90 (DRAPES) ×3 IMPLANT
DRAPE IMP U-DRAPE 54X76 (DRAPES) ×3 IMPLANT
DRAPE U-SHAPE 47X51 STRL (DRAPES) ×2 IMPLANT
DURAPREP 26ML APPLICATOR (WOUND CARE) ×5 IMPLANT
ELECT MENISCUS 165MM 90D (ELECTRODE) IMPLANT
ELECT REM PT RETURN 9FT ADLT (ELECTROSURGICAL)
ELECTRODE REM PT RTRN 9FT ADLT (ELECTROSURGICAL) IMPLANT
GAUZE SPONGE 4X4 12PLY STRL (GAUZE/BANDAGES/DRESSINGS) ×3 IMPLANT
GLOVE BIO SURGEON STRL SZ7 (GLOVE) ×2 IMPLANT
GLOVE BIO SURGEON STRL SZ8 (GLOVE) ×3 IMPLANT
GLOVE BIOGEL PI IND STRL 8 (GLOVE) ×4 IMPLANT
GLOVE BIOGEL PI INDICATOR 8 (GLOVE) ×2
GLOVE ORTHO TXT STRL SZ7.5 (GLOVE) ×3 IMPLANT
GOWN STRL REUS W/ TWL LRG LVL3 (GOWN DISPOSABLE) ×1 IMPLANT
GOWN STRL REUS W/ TWL XL LVL3 (GOWN DISPOSABLE) ×5 IMPLANT
GOWN STRL REUS W/TWL LRG LVL3 (GOWN DISPOSABLE)
GOWN STRL REUS W/TWL XL LVL3 (GOWN DISPOSABLE) ×9
KNEE WRAP E Z 3 GEL PACK (MISCELLANEOUS) IMPLANT
MANIFOLD NEPTUNE II (INSTRUMENTS) ×3 IMPLANT
PACK ARTHROSCOPY DSU (CUSTOM PROCEDURE TRAY) ×3 IMPLANT
PACK BASIN DAY SURGERY FS (CUSTOM PROCEDURE TRAY) ×3 IMPLANT
PENCIL BUTTON HOLSTER BLD 10FT (ELECTRODE) IMPLANT
PROBE BIPOLAR ATHRO 135MM 90D (MISCELLANEOUS) IMPLANT
SET ARTHROSCOPY TUBING (MISCELLANEOUS) ×3
SET ARTHROSCOPY TUBING LN (MISCELLANEOUS) ×2 IMPLANT
SLEEVE SCD COMPRESS KNEE MED (MISCELLANEOUS) ×2 IMPLANT
SUT MNCRL AB 4-0 PS2 18 (SUTURE) ×3 IMPLANT
TOWEL OR 17X24 6PK STRL BLUE (TOWEL DISPOSABLE) ×3 IMPLANT
WATER STERILE IRR 1000ML POUR (IV SOLUTION) ×3 IMPLANT

## 2016-06-26 NOTE — Discharge Instructions (Signed)
Post Anesthesia Home Care Instructions  Activity: Get plenty of rest for the remainder of the day. A responsible adult should stay with you for 24 hours following the procedure.  For the next 24 hours, DO NOT: -Drive a car -Paediatric nurse -Drink alcoholic beverages -Take any medication unless instructed by your physician -Make any legal decisions or sign important papers.  Meals: Start with liquid foods such as gelatin or soup. Progress to regular foods as tolerated. Avoid greasy, spicy, heavy foods. If nausea and/or vomiting occur, drink only clear liquids until the nausea and/or vomiting subsides. Call your physician if vomiting continues.  Special Instructions/Symptoms: Your throat may feel dry or sore from the anesthesia or the breathing tube placed in your throat during surgery. If this causes discomfort, gargle with warm salt water. The discomfort should disappear within 24 hours.  If you had a scopolamine patch placed behind your ear for the management of post- operative nausea and/or vomiting:  1. The medication in the patch is effective for 72 hours, after which it should be removed.  Wrap patch in a tissue and discard in the trash. Wash hands thoroughly with soap and water. 2. You may remove the patch earlier than 72 hours if you experience unpleasant side effects which may include dry mouth, dizziness or visual disturbances. 3. Avoid touching the patch. Wash your hands with soap and water after contact with the patch.   Diet: As you were doing prior to hospitalization   Shower:  May shower but keep the wounds dry, use an occlusive plastic wrap, NO SOAKING IN TUB.  If the bandage gets wet, change with a clean dry gauze.  If you have a splint on, leave the splint in place and keep the splint dry with a plastic bag.  Dressing:  You may change your dressing 3-5 days after surgery, unless you have a splint.  If you have a splint, then just leave the splint in place and we will  change your bandages during your first follow-up appointment.    If you had hand or foot surgery, we will plan to remove your stitches in about 2 weeks in the office.  For all other surgeries, there are sticky tapes (steri-strips) on your wounds and all the stitches are absorbable.  Leave the steri-strips in place when changing your dressings, they will peel off with time, usually 2-3 weeks.  Activity:  Increase activity slowly as tolerated, but follow the weight bearing instructions below.  The rules on driving is that you can not be taking narcotics while you drive, and you must feel in control of the vehicle.    Weight Bearing:   As tolerated.    To prevent constipation: you may use a stool softener such as -  Colace (over the counter) 100 mg by mouth twice a day  Drink plenty of fluids (prune juice may be helpful) and high fiber foods Miralax (over the counter) for constipation as needed.    Itching:  If you experience itching with your medications, try taking only a single pain pill, or even half a pain pill at a time.  You may take up to 10 pain pills per day, and you can also use benadryl over the counter for itching or also to help with sleep.   Precautions:  If you experience chest pain or shortness of breath - call 911 immediately for transfer to the hospital emergency department!!  If you develop a fever greater that 101 F, purulent drainage from  wound, increased redness or drainage from wound, or calf pain -- Call the office at [phone]                                                Follow- Up Appointment:  Please call for an appointment to be seen in 2 weeks [place] - [phone]

## 2016-06-26 NOTE — Anesthesia Preprocedure Evaluation (Addendum)
Anesthesia Evaluation  Patient identified by MRN, date of birth, ID band Patient awake    Reviewed: Allergy & Precautions, NPO status , Patient's Chart, lab work & pertinent test results  Airway Mallampati: II  TM Distance: >3 FB Neck ROM: Full    Dental  (+) Dental Advisory Given   Pulmonary sleep apnea (Non-compliant with CPAP) ,    breath sounds clear to auscultation       Cardiovascular negative cardio ROS   Rhythm:Regular Rate:Normal     Neuro/Psych negative neurological ROS     GI/Hepatic Neg liver ROS, GERD  Medicated,  Endo/Other  Hypothyroidism   Renal/GU negative Renal ROS     Musculoskeletal  (+) Arthritis ,   Abdominal   Peds  Hematology negative hematology ROS (+)   Anesthesia Other Findings   Reproductive/Obstetrics                            Anesthesia Physical Anesthesia Plan  ASA: III  Anesthesia Plan: General   Post-op Pain Management:    Induction: Intravenous  Airway Management Planned: LMA  Additional Equipment:   Intra-op Plan:   Post-operative Plan: Extubation in OR  Informed Consent: I have reviewed the patients History and Physical, chart, labs and discussed the procedure including the risks, benefits and alternatives for the proposed anesthesia with the patient or authorized representative who has indicated his/her understanding and acceptance.   Dental advisory given  Plan Discussed with: CRNA  Anesthesia Plan Comments:         Anesthesia Quick Evaluation

## 2016-06-26 NOTE — H&P (Signed)
PREOPERATIVE H&P  Chief Complaint: Right knee pain  HPI: Sabrina [name] is a 61 y.o. female who presents for preoperative history and physical with a diagnosis of right knee pain, with some early degenerative changes and meniscal tearing. Symptoms are rated as moderate to severe, and have been worsening.  This is significantly impairing activities of daily living.  She has elected for surgical management. She is failed injections, activity modification, anti-inflammatories. Pain is rated 10/10.  Past Medical History:  Diagnosis Date  . Allergic rhinitis   . Arthritis    Right knee, right elbow  . GERD (gastroesophageal reflux disease)   . Hypothyroidism 2008  . Obstructive sleep apnea    CPAP   --   NOT ABLE TO USE   . Viral meningitis 5/08   [facility]   Past Surgical History:  Procedure Laterality Date  . ABDOMINAL HYSTERECTOMY  1982   Dr Vernie Ammons assisted by Dr Bary Castilla  . APPENDECTOMY     teenager  . BREAST BIOPSY Left 1999   [facility]  Marland Kitchen BREAST BIOPSY Left 2017   benign  . KNEE ARTHROSCOPY W/ LASER  8/07   Left  . TONSILLECTOMY     as child  . VESICOVAGINAL FISTULA CLOSURE W/ TAH  1987   Fibroids   Social History   Social History  . Marital status: Married    Spouse name: N/A  . Number of children: N/A  . Years of education: N/A   Occupational History  . 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  . Caregiver --part time     [employer]   Social History Main Topics  . Smoking status: Never Smoker  . Smokeless tobacco: Never Used  . Alcohol use Yes     Comment: beer on weekends- occasional  . Drug use: No  . Sexual activity: Not Asked   Other Topics Concern  . None   Social History Narrative   From [place].   Moved to [place] in high school then back here.     Family History  Problem Relation Age of Onset  . Cancer Mother     mastectomy for breast cancer  . Cancer Paternal Uncle     pancreatic cancer  . Hypertension      both sides  . Cancer     maternal side  . Prostate cancer Maternal Grandfather   . Breast cancer Maternal Aunt   . Cancer Maternal Aunt     breast cancer  . Coronary artery disease Neg Hx   . Diabetes Neg Hx    No Known Allergies Prior to Admission medications   Medication Sig Start Date End Date Taking? Authorizing Provider  acetaminophen (TYLENOL) 500 MG tablet Take 1,000 mg by mouth 3 (three) times daily as needed for moderate pain.   Yes Historical Provider, MD  furosemide (LASIX) 80 MG tablet Take 1 tablet (80 mg total) by mouth daily. 09/14/15  Yes Venia Carbon, MD  levothyroxine (SYNTHROID, LEVOTHROID) 50 MCG tablet Take 1 tablet (50 mcg total) by mouth daily. 09/14/15  Yes Venia Carbon, MD  omeprazole (PRILOSEC) 20 MG capsule Take 1 capsule (20 mg total) by mouth daily. 09/14/15  Yes Venia Carbon, MD     Positive ROS: All other systems have been reviewed and were otherwise negative with the exception of those mentioned in the HPI and as above.  Physical Exam: General: Alert, no acute distress Cardiovascular: No pedal edema Respiratory: No cyanosis, no use of  accessory musculature GI: No organomegaly, abdomen is soft and non-tender Skin: No lesions in the area of chief complaint Neurologic: Sensation intact distally Psychiatric: Patient is competent for consent with normal mood and affect Lymphatic: No axillary or cervical lymphadenopathy  MUSCULOSKELETAL: Right knee has medial joint line tenderness, range of motion 0-110, mild pseudo-laxity, trace crepitance.  Assessment: Right knee medial meniscus tear with early degenerative changes   Plan: Plan for Procedure(s): ARTHROSCOPY KNEE MEDIAL MENISCECTOMY, CHONDROPLASTY  The risks benefits and alternatives were discussed with the patient including but not limited to the risks of nonoperative treatment, versus surgical intervention including infection, bleeding, nerve injury,  blood clots, cardiopulmonary complications, morbidity,  mortality, among others, and they were willing to proceed. We had initially planned for partial knee replacement, however her insurance company did not authorize this procedure, indicating that her arthritis was not severe enough to warrant arthroplasty, so we will perform a knee arthroscopy in attempt to improve symptoms and prolong the time until arthroplasty is necessary.  Johnny Bridge, MD Cell [phone]   06/26/2016 10:18 AM

## 2016-06-26 NOTE — Op Note (Signed)
06/26/2016  11:53 AM  PATIENT:  Sabrina [name]    PRE-OPERATIVE DIAGNOSIS:  Right knee medial meniscus tear  POST-OPERATIVE DIAGNOSIS:  Same  PROCEDURE:  RIGHT KNEE ARTHROSCOPY WITH MEDIAL MENISCECTOMY   SURGEON:  Johnny Bridge, MD  PHYSICIAN ASSISTANT: Joya Gaskins, OPA-C, present and scrubbed throughout the case, critical for completion in a timely fashion, and for retraction, instrumentation, and closure.  ANESTHESIA:   General  PREOPERATIVE INDICATIONS:  EURA MICCO is a  61 y.o. female with a diagnosis of right knee medial meniscus tear with some subchondral marrow edema who failed conservative measures and elected for surgical management.    The risks benefits and alternatives were discussed with the patient preoperatively including but not limited to the risks of infection, bleeding, nerve injury, cardiopulmonary complications, the need for revision surgery, among others, and the patient was willing to proceed. We also discussed the risks for incomplete relief of pain, progression of knee arthritis, the potential need for future arthroplasty, among others.  ESTIMATED BLOOD LOSS: Minimal  OPERATIVE IMPLANTS: None  OPERATIVE FINDINGS: The knee had range of motion 0 210 during examination under anesthesia due to her body habitus. The patellofemoral joint was intact, no significant chondral changes on the femoral trochlea or the patella, and the lateral compartment was completely normal on the femur, the lateral meniscus was intact, the lateral tibial plateau had a little grade 2 changes, but nothing bad, the central portion of the lateral meniscus had some slight fraying but no tears of any consequence.  The medial compartment had remarkably good cartilage, I was in fact satisfied and happy that I was not doing a partial knee replacement as we had originally discussed. I did not find any full-thickness chondral lesions, there may be some softening of the cartilage at worst, but  no full-thickness chondral flaps. The medial meniscus had a complex tear from the mid body around to the posterior horn.  OPERATIVE PROCEDURE: The patient was brought to the operating room and placed in the supine position. General anesthesia was in a minister. IV antibiotics were given. The right lower extremity was prepped and draped in usual sterile fashion. Time out performed. Diagnostic arthroscopy carried out with the above-named findings. I used an inferomedial and inferolateral portal.  The arthroscopic shaver was used to debride the medial meniscus back to a stable configuration and I also used the basket to debride the posterior horn. Once a stable configuration and been achieved, there were no loose flaps, I removed the arthroscopic instruments, repaired the portals with Monocryl followed by Steri-Strips and sterile gauze. She was awakened and returned to the PACU in stable and satisfactory condition. There were no complications and she tolerated the procedure well.

## 2016-06-26 NOTE — Anesthesia Procedure Notes (Signed)
Procedure Name: LMA Insertion Date/Time: 06/26/2016 11:10 AM Performed by: Rayvon Char Pre-anesthesia Checklist: Patient identified, Emergency Drugs available, Suction available and Patient being monitored Patient Re-evaluated:Patient Re-evaluated prior to inductionOxygen Delivery Method: Circle system utilized Preoxygenation: Pre-oxygenation with 100% oxygen Intubation Type: IV induction Ventilation: Mask ventilation without difficulty Laryngoscope Size: Miller and 2 Grade View: Grade II Tube size: 7.0 mm Number of attempts: 1 Airway Equipment and Method: Stylet Placement Confirmation: ETT inserted through vocal cords under direct vision,  positive ETCO2 and breath sounds checked- equal and bilateral Secured at: 22 cm Tube secured with: Tape Dental Injury: Teeth and Oropharynx as per pre-operative assessment

## 2016-06-26 NOTE — Transfer of Care (Signed)
Immediate Anesthesia Transfer of Care Note  Patient: Sabrina [name]  Procedure(s) Performed: Procedure(s): RIGHT KNEE ARTHROSCOPY WITH MEDIAL MENISCECTOMY AND CHONDROPLASTY (Right)  Patient Location: PACU  Anesthesia Type:General  Level of Consciousness: awake, alert  and oriented  Airway & Oxygen Therapy: Patient Spontanous Breathing and Patient connected to face mask oxygen  Post-op Assessment: Report given to RN and Post -op Vital signs reviewed and stable  Post vital signs: Reviewed and stable  Last Vitals:  Vitals:   06/26/16 1210 06/26/16 1212  BP: (!) 151/92   Pulse: 72 68  Resp: 14 16  Temp:      Last Pain:  Vitals:   06/26/16 0929  TempSrc: Oral  PainSc: 9       Patients Stated Pain Goal: 3 ([id number])  Complications: No apparent anesthesia complications

## 2016-06-26 NOTE — Anesthesia Postprocedure Evaluation (Signed)
Anesthesia Post Note  Patient: Sabrina [name]  Procedure(s) Performed: Procedure(s) (LRB): RIGHT KNEE ARTHROSCOPY WITH MEDIAL MENISCECTOMY AND CHONDROPLASTY (Right)  Patient location during evaluation: PACU Anesthesia Type: General Level of consciousness: sedated and patient cooperative Pain management: pain level controlled Vital Signs Assessment: post-procedure vital signs reviewed and stable Respiratory status: spontaneous breathing Cardiovascular status: stable Anesthetic complications: no       Last Vitals:  Vitals:   06/26/16 1415 06/26/16 1418  BP: (!) 147/91   Pulse: 70 75  Resp: 14 17  Temp:      Last Pain:  Vitals:   06/26/16 1415  TempSrc:   PainSc: [place]

## 2016-06-27 ENCOUNTER — Encounter (HOSPITAL_BASED_OUTPATIENT_CLINIC_OR_DEPARTMENT_OTHER): Payer: Self-pay | Admitting: Orthopedic Surgery

## 2016-06-27 DIAGNOSIS — S83241A Other tear of medial meniscus, current injury, right knee, initial encounter: Secondary | ICD-10-CM | POA: Diagnosis not present

## 2016-06-28 ENCOUNTER — Telehealth: Payer: Self-pay

## 2016-06-28 NOTE — Telephone Encounter (Signed)
Pt left v/m; pt appreciated Dr Alla German call to ck on her; pt did have to spend one night in hospital due to sleep apnea. Pt is at home now and doing well, pt is walking in her home with and without the walker. Pt mainly wanted to tell Dr Silvio Pate how much she appreciated his call. FYI to Dr Silvio Pate upon his return.

## 2016-06-29 NOTE — Telephone Encounter (Signed)
Good to know she is feeling better.

## 2016-07-02 ENCOUNTER — Encounter (HOSPITAL_BASED_OUTPATIENT_CLINIC_OR_DEPARTMENT_OTHER): Payer: Self-pay | Admitting: Orthopedic Surgery

## 2016-09-14 ENCOUNTER — Other Ambulatory Visit: Payer: 59

## 2016-09-24 ENCOUNTER — Ambulatory Visit: Payer: 59 | Admitting: Obstetrics & Gynecology

## 2016-09-28 ENCOUNTER — Encounter (INDEPENDENT_AMBULATORY_CARE_PROVIDER_SITE_OTHER): Payer: Self-pay

## 2016-09-28 ENCOUNTER — Ambulatory Visit (INDEPENDENT_AMBULATORY_CARE_PROVIDER_SITE_OTHER): Payer: 59 | Admitting: Internal Medicine

## 2016-09-28 ENCOUNTER — Encounter: Payer: Self-pay | Admitting: Internal Medicine

## 2016-09-28 VITALS — BP 128/90 | HR 91 | Temp 97.4°F | Ht 64.0 in | Wt 251.0 lb

## 2016-09-28 DIAGNOSIS — I872 Venous insufficiency (chronic) (peripheral): Secondary | ICD-10-CM | POA: Diagnosis not present

## 2016-09-28 DIAGNOSIS — Z0000 Encounter for general adult medical examination without abnormal findings: Secondary | ICD-10-CM | POA: Diagnosis not present

## 2016-09-28 DIAGNOSIS — G4733 Obstructive sleep apnea (adult) (pediatric): Secondary | ICD-10-CM | POA: Diagnosis not present

## 2016-09-28 DIAGNOSIS — E039 Hypothyroidism, unspecified: Secondary | ICD-10-CM | POA: Diagnosis not present

## 2016-09-28 MED ORDER — FUROSEMIDE 80 MG PO TABS: 80.0000 mg | ORAL_TABLET | Freq: Every day | ORAL | 0 refills | Status: DC | PRN

## 2016-09-28 NOTE — Assessment & Plan Note (Signed)
Doesn't tolerate the current machine --but it is old Will set up with sleep eval to see if other options available She has fatigue--but may be due to 3rd shift and family care responsibilities

## 2016-09-28 NOTE — Progress Notes (Signed)
Subjective:    Patient ID: Sabrina [name], female    DOB: [birth year], 60 y.o.   MRN: [id number]  HPI Here for physical  Did well with the arthroscopy Pain is much better but still has some issues Has continued home exercise program Walks at work on break  Had colposcopy 2 years ago Asked why---she thinks everything was removed Report just states normal vaginal mucosa [facility]  Chronic swelling in legs Furosemide doesn't seem to make much difference Didn't have problem when out of work---not happy with support hose No sig pain  Same thyroid dose  Current Outpatient Prescriptions on File Prior to Visit  Medication Sig Dispense Refill  . baclofen (LIORESAL) 10 MG tablet Take 1 tablet (10 mg total) by mouth 3 (three) times daily. As needed for muscle spasm 50 tablet 0  . levothyroxine (SYNTHROID, LEVOTHROID) 50 MCG tablet Take 1 tablet (50 mcg total) by mouth daily. 90 tablet 3  . omeprazole (PRILOSEC) 20 MG capsule Take 1 capsule (20 mg total) by mouth daily. 90 capsule 3  . ondansetron (ZOFRAN) 4 MG tablet Take 1 tablet (4 mg total) by mouth every 8 (eight) hours as needed for nausea or vomiting. 30 tablet 0  . sennosides-docusate sodium (SENOKOT-S) 8.6-50 MG tablet Take 2 tablets by mouth daily. 30 tablet 1   No current facility-administered medications on file prior to visit.     No Known Allergies  Past Medical History:  Diagnosis Date  . Allergic rhinitis   . Arthritis    Right knee, right elbow  . Complex tear of medial meniscus of right knee 06/26/2016  . GERD (gastroesophageal reflux disease)   . Hypothyroidism 2008  . Obstructive sleep apnea    CPAP   --   NOT ABLE TO USE   . Viral meningitis 5/08   [facility]    Past Surgical History:  Procedure Laterality Date  . ABDOMINAL HYSTERECTOMY  1982   Dr Vernie Ammons assisted by Dr Bary Castilla  . APPENDECTOMY     teenager  . BREAST BIOPSY Left 1999   [facility].  Marland Kitchen BREAST BIOPSY Left 2017   benign    . CHONDROPLASTY Right 06/26/2016   Procedure: CHONDROPLASTY;  Surgeon: Marchia Bond, MD;  Location: [place];  Service: Orthopedics;  Laterality: Right;  . KNEE ARTHROSCOPY W/ LASER  8/07   Left  . KNEE ARTHROSCOPY WITH MEDIAL MENISECTOMY Right 06/26/2016   Procedure: RIGHT KNEE ARTHROSCOPY WITH MEDIAL MENISCECTOMY AND CHONDROPLASTY;  Surgeon: Marchia Bond, MD;  Location: [place];  Service: Orthopedics;  Laterality: Right;  . TONSILLECTOMY     as child  . VESICOVAGINAL FISTULA CLOSURE W/ TAH  1987   Fibroids    Family History  Problem Relation Age of Onset  . Cancer Mother        mastectomy for breast cancer  . Cancer Paternal Uncle        pancreatic cancer  . Hypertension Unknown        both sides  . Cancer Unknown        maternal side  . Prostate cancer Maternal Grandfather   . Breast cancer Maternal Aunt   . Cancer Maternal Aunt        breast cancer  . Coronary artery disease Neg Hx   . Diabetes Neg Hx     Social History   Social History  . Marital status: Married    Spouse name: N/A  . Number of children: N/A  .  Years of education: N/A   Occupational History  . 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  . Caregiver --part time     [employer]   Social History Main Topics  . Smoking status: Never Smoker  . Smokeless tobacco: Never Used  . Alcohol use Yes     Comment: beer on weekends- occasional  . Drug use: No  . Sexual activity: Not on file   Other Topics Concern  . Not on file   Social History Narrative   From [place].   Moved to [place] in high school then back here.     Review of Systems  Constitutional: Negative for unexpected weight change.       Wears seat belt  HENT: Negative for hearing loss, tinnitus and trouble swallowing.        Keeps up with dentist  Eyes:       Recent "pink eye" No sig vision changes  Respiratory: Negative for cough, chest tightness and shortness of breath.   Cardiovascular: Positive for  leg swelling. Negative for chest pain and palpitations.  Gastrointestinal: Negative for abdominal pain, blood in stool, constipation and nausea.       No sig heartburn--takes the PPI intermittently  Endocrine: Negative for polydipsia and polyuria.  Genitourinary: Negative for dyspareunia, dysuria and hematuria.  Musculoskeletal: Positive for arthralgias. Negative for back pain and joint swelling.  Skin: Negative for rash.       Brief rash behind ear---resolved No suspicious lesions Dermatologist prn  Allergic/Immunologic: Positive for environmental allergies. Negative for immunocompromised state.       No meds  Neurological: Negative for dizziness, syncope, light-headedness and headaches.  Hematological: Negative for adenopathy. Does not bruise/bleed easily.  Psychiatric/Behavioral: Positive for sleep disturbance. Negative for dysphoric mood. The patient is not nervous/anxious.        Shift issues with sleep. Doesn't use the CPAP (can't sleep with it). Some daytime somnolence still       Objective:   Physical Exam  Constitutional: She is oriented to person, place, and time. She appears well-developed and well-nourished. No distress.  HENT:  Head: Normocephalic and atraumatic.  Right Ear: External ear normal.  Left Ear: External ear normal.  Mouth/Throat: Oropharynx is clear and moist. No oropharyngeal exudate.  Eyes: Conjunctivae are normal. Pupils are equal, round, and reactive to light.  Neck: Normal range of motion. Neck supple. No thyromegaly present.  Cardiovascular: Normal rate, regular rhythm, normal heart sounds and intact distal pulses.  Exam reveals no gallop.   No murmur heard. Pulmonary/Chest: Effort normal and breath sounds normal. No respiratory distress. She has no wheezes. She has no rales.  Abdominal: Soft. There is no tenderness.  Musculoskeletal: She exhibits no tenderness.  2-3+ non pitting edema in calves  Lymphadenopathy:    She has no cervical adenopathy.    Neurological: She is alert and oriented to person, place, and time.  Skin: No rash noted. No erythema.  Psychiatric: She has a normal mood and affect. Her behavior is normal.          Assessment & Plan:

## 2016-09-28 NOTE — Assessment & Plan Note (Signed)
Will check labs

## 2016-09-28 NOTE — Assessment & Plan Note (Signed)
Healthy but needs to work better on fitness Chronic sleep problems Colon due 2020 UTD on mammogram No cervix--not sure why they were doing paps

## 2016-09-28 NOTE — Assessment & Plan Note (Signed)
Furosemide not much good Has it for prn Doesn't like support hose No pain

## 2016-09-29 LAB — COMPREHENSIVE METABOLIC PANEL
ALBUMIN: 4.3 g/dL (ref 3.5–5.5)
ALK PHOS: 84 IU/L (ref 39–117)
ALT: 15 IU/L (ref 0–32)
AST: 17 IU/L (ref 0–40)
Albumin/Globulin Ratio: 1.4 (ref 1.2–2.2)
BUN / CREAT RATIO: 20 (ref 9–23)
BUN: 16 mg/dL (ref 6–24)
Bilirubin Total: 0.3 mg/dL (ref 0.0–1.2)
CO2: 24 mmol/L (ref 18–29)
CREATININE: 0.8 mg/dL (ref 0.57–1.00)
Calcium: 9.3 mg/dL (ref 8.7–10.2)
Chloride: 103 mmol/L (ref 96–106)
GFR calc Af Amer: 94 mL/min/{1.73_m2} (ref >59)
GFR, EST NON AFRICAN AMERICAN: 82 mL/min/{1.73_m2} (ref >59)
GLOBULIN, TOTAL: 3 g/dL (ref 1.5–4.5)
Glucose: 120 mg/dL — ABNORMAL HIGH (ref 65–99)
Potassium: 4.1 mmol/L (ref 3.5–5.2)
SODIUM: 143 mmol/L (ref 134–144)
Total Protein: 7.3 g/dL (ref 6.0–8.5)

## 2016-09-29 LAB — CBC WITH DIFFERENTIAL/PLATELET
BASOS: 0 %
Basophils Absolute: 0 10*3/uL (ref 0.0–0.2)
EOS (ABSOLUTE): 0.1 10*3/uL (ref 0.0–0.4)
EOS: 2 %
HEMATOCRIT: 42.6 % (ref 34.0–46.6)
Hemoglobin: 13.8 g/dL (ref 11.1–15.9)
Immature Grans (Abs): 0 10*3/uL (ref 0.0–0.1)
Immature Granulocytes: 0 %
LYMPHS ABS: 2.9 10*3/uL (ref 0.7–3.1)
Lymphs: 30 %
MCH: 29.8 pg (ref 26.6–33.0)
MCHC: 32.4 g/dL (ref 31.5–35.7)
MCV: 92 fL (ref 79–97)
MONOS ABS: 0.9 10*3/uL (ref 0.1–0.9)
Monocytes: 9 %
NEUTROS ABS: 5.7 10*3/uL (ref 1.4–7.0)
Neutrophils: 59 %
Platelets: 304 10*3/uL (ref 150–379)
RBC: 4.63 x10E6/uL (ref 3.77–5.28)
RDW: 13.8 % (ref 12.3–15.4)
WBC: 9.6 10*3/uL (ref 3.4–10.8)

## 2016-09-29 LAB — TSH: TSH: 5.51 u[IU]/mL — ABNORMAL HIGH (ref 0.450–4.500)

## 2016-09-29 LAB — T4, FREE: Free T4: 1.19 ng/dL (ref 0.82–1.77)

## 2016-10-16 ENCOUNTER — Ambulatory Visit (INDEPENDENT_AMBULATORY_CARE_PROVIDER_SITE_OTHER): Payer: 59 | Admitting: Internal Medicine

## 2016-10-16 ENCOUNTER — Encounter: Payer: Self-pay | Admitting: Internal Medicine

## 2016-10-16 VITALS — BP 130/90 | HR 87 | Resp 16 | Ht 64.0 in | Wt 252.0 lb

## 2016-10-16 DIAGNOSIS — G4733 Obstructive sleep apnea (adult) (pediatric): Secondary | ICD-10-CM | POA: Diagnosis not present

## 2016-10-16 NOTE — Progress Notes (Signed)
Sabrina [name]      Assessment and Plan:  Obstructive sleep apnea. -No history of obstructive sleep apnea, currently not compliant with CPAP. -We'll order a new sleep study, started on CPAP as indicated. Patient is asked to call us if she has trouble tolerating CPAP and we will attempt to work with her through this.  Excessive daytime sleepiness. -Secondary to above.  Obesity. -Likely contributory, weight loss may be beneficial.   Date: 10/16/2016  MRN# [id number] Sabrina [name] 12-23-1955  Referring Physician:  Review home sleep study report 12/04/08: Sabrina [name] is a 61 y.o. old female seen in [name] for chief complaint of:    Chief Complaint  Patient presents with  . sleep consult    Referred by Dr. Silvio Pate    HPI:   Patient is 61 year old female with a history of obstructive sleep apnea. Patient was diagnosed with obstructive sleep apnea, possibly 10 years ago per patient, she was noncompliant with her machine and does not continue to use. She returns now with the symptoms of excessive daytime sleepiness, with an Epworth scale score of 15.  She was diagnosed initially after she noted that she was having a choking sensation. She had a sleep study at [facility]; and was started on CPAP. She started using it but could not sleep with it, she had a full face mask. She felt that she could not get her breath with it.   Currently her doctor refers her back here because she has been feeling very tired and sleepy during the day. She has trouble staying awake during the day, she works at [employer].   Review home sleep study report 12/04/08: Sleep latency 12 minutes. AHI equals 14.    PMHX:   Past Medical History:  Diagnosis Date  . Allergic rhinitis   . Arthritis    Right knee, right elbow  . Complex tear of medial meniscus of right knee 06/26/2016  . GERD (gastroesophageal reflux disease)   . Hypothyroidism 2008  . Obstructive sleep apnea    CPAP    --   NOT ABLE TO USE   . Viral meningitis 5/08   [facility]   Surgical Hx:  Past Surgical History:  Procedure Laterality Date  . ABDOMINAL HYSTERECTOMY  1982   Dr Vernie Ammons assisted by Dr Bary Castilla  . APPENDECTOMY     teenager  . BREAST BIOPSY Left 1999   [facility].  Marland Kitchen BREAST BIOPSY Left 2017   benign  . CHONDROPLASTY Right 06/26/2016   Procedure: CHONDROPLASTY;  Surgeon: Marchia Bond, MD;  Location: [facility];  Service: Orthopedics;  Laterality: Right;  . KNEE ARTHROSCOPY W/ LASER  8/07   Left  . KNEE ARTHROSCOPY WITH MEDIAL MENISECTOMY Right 06/26/2016   Procedure: RIGHT KNEE ARTHROSCOPY WITH MEDIAL MENISCECTOMY AND CHONDROPLASTY;  Surgeon: Marchia Bond, MD;  Location: [facility];  Service: Orthopedics;  Laterality: Right;  . TONSILLECTOMY     as child  . VESICOVAGINAL FISTULA CLOSURE W/ TAH  1987   Fibroids   Family Hx:  Family History  Problem Relation Age of Onset  . Cancer Mother        mastectomy for breast cancer  . Cancer Paternal Uncle        pancreatic cancer  . Hypertension Unknown        both sides  . Cancer Unknown        maternal side  . Prostate cancer Maternal Grandfather   . Breast cancer Maternal  Aunt   . Cancer Maternal Aunt        breast cancer  . Coronary artery disease Neg Hx   . Diabetes Neg Hx    Social Hx:   Social History  Substance Use Topics  . Smoking status: Never Smoker  . Smokeless tobacco: Never Used  . Alcohol use Yes     Comment: beer on weekends- occasional   Medication:    Current Outpatient Prescriptions:  .  baclofen (LIORESAL) 10 MG tablet, Take 1 tablet (10 mg total) by mouth 3 (three) times daily. As needed for muscle spasm, Disp: 50 tablet, Rfl: 0 .  furosemide (LASIX) 80 MG tablet, Take 1 tablet (80 mg total) by mouth daily as needed., Disp: 1 tablet, Rfl: 0 .  levothyroxine (SYNTHROID, LEVOTHROID) 50 MCG tablet, Take 1 tablet (50 mcg total) by mouth daily., Disp: 90 tablet, Rfl: 3 .   meloxicam (MOBIC) 15 MG tablet, Take 1 tablet by mouth daily., Disp: , Rfl:  .  naproxen sodium (ANAPROX) 220 MG tablet, Take 1 tablet by mouth 2 (two) times daily as needed., Disp: , Rfl:  .  omeprazole (PRILOSEC) 20 MG capsule, Take 1 capsule (20 mg total) by mouth daily., Disp: 90 capsule, Rfl: 3 .  ondansetron (ZOFRAN) 4 MG tablet, Take 1 tablet (4 mg total) by mouth every 8 (eight) hours as needed for nausea or vomiting., Disp: 30 tablet, Rfl: 0 .  sennosides-docusate sodium (SENOKOT-S) 8.6-50 MG tablet, Take 2 tablets by mouth daily., Disp: 30 tablet, Rfl: 1   Allergies:  Patient has no known allergies.  Review of Systems: Gen:  Denies  fever, sweats, chills HEENT: Denies blurred vision, double vision. bleeds, sore throat Cvc:  No dizziness, chest pain. Resp:   Denies cough or sputum production, shortness of breath Gi: Denies swallowing difficulty, stomach pain. Gu:  Denies bladder incontinence, burning urine Ext:   No Joint pain, stiffness. Skin: No skin rash,  hives  Endoc:  No polyuria, polydipsia. Psych: No depression, insomnia. Other:  All other systems were reviewed with the patient and were negative other that what is mentioned in the HPI.   Physical Examination:   VS: BP 130/90 (BP Location: Left Arm, Cuff Size: Normal)   Pulse 87   Resp 16   Ht 5\' 4"  (1.626 m)   Wt 252 lb (114.3 kg)   SpO2 100%   BMI 43.26 kg/m   General Appearance: No distress  Neuro:without focal findings,  speech normal,  HEENT: PERRLA, EOM intact.   Pulmonary: normal breath sounds, No wheezing.  CardiovascularNormal S1,S2.  No m/r/g.   Abdomen: Benign, Soft, non-tender. Renal:  No costovertebral tenderness  GU:  No performed at this time. Endoc: No evident thyromegaly, no signs of acromegaly. Skin:   warm, no rashes, no ecchymosis  Extremities: normal, no cyanosis, clubbing.  Other findings:    LABORATORY PANEL:   CBC No results for input(s): WBC, HGB, HCT, PLT in the last 168  hours. ------------------------------------------------------------------------------------------------------------------  Chemistries  No results for input(s): NA, K, CL, CO2, GLUCOSE, BUN, CREATININE, CALCIUM, MG, AST, ALT, ALKPHOS, BILITOT in the last 168 hours.  Invalid input(s): GFRCGP ------------------------------------------------------------------------------------------------------------------  Cardiac Enzymes No results for input(s): TROPONINI in the last 168 hours. ------------------------------------------------------------  RADIOLOGY:  No results found.     Thank  you for the [name] and for allowing [facility] to assist in the care of your patient. Our recommendations are noted above.  Please contact us if we can  be of further service.   Marda Stalker, MD.  Board Certified in Internal Medicine, Pulmonary Medicine, [place], and Sleep Medicine.  [facility] Office Number: [phone]  Patricia Pesa, M.D.  Merton Border, M.D  10/16/2016

## 2016-10-16 NOTE — Patient Instructions (Signed)
  Sleep Apnea Sleep apnea is disorder that affects a person's sleep. A person with sleep apnea has abnormal pauses in their breathing when they sleep. It is hard for them to get a good sleep. This makes a person tired during the day. It also can lead to other physical problems. There are three types of sleep apnea. One type is when breathing stops for a short time because your airway is blocked (obstructive sleep apnea). Another type is when the brain sometimes fails to give the normal signal to breathe to the muscles that control your breathing (central sleep apnea). The third type is a combination of the other two types. HOME CARE   Take all medicine as told by your doctor.  Avoid alcohol, calming medicines (sedatives), and depressant drugs.  Try to lose weight if you are overweight. Talk to your doctor about a healthy weight goal.  Your doctor may have you use a device that helps to open your airway. It can help you get the air that you need. It is called a positive airway pressure (PAP) device.   MAKE SURE YOU:   Understand these instructions.  Will watch your condition.  Will get help right away if you are not doing well or get worse.  It may take approximately 1 month for you to get used to wearing her CPAP every night.  Be sure to work with your machine to get used to it, be patient, it may take time! 

## 2016-10-29 ENCOUNTER — Encounter: Payer: Self-pay | Admitting: Internal Medicine

## 2016-10-29 DIAGNOSIS — G4733 Obstructive sleep apnea (adult) (pediatric): Secondary | ICD-10-CM

## 2016-11-12 DIAGNOSIS — G4733 Obstructive sleep apnea (adult) (pediatric): Secondary | ICD-10-CM | POA: Diagnosis not present

## 2016-11-14 ENCOUNTER — Other Ambulatory Visit: Payer: Self-pay | Admitting: Internal Medicine

## 2016-11-14 ENCOUNTER — Telehealth: Payer: Self-pay | Admitting: *Deleted

## 2016-11-14 DIAGNOSIS — G4733 Obstructive sleep apnea (adult) (pediatric): Secondary | ICD-10-CM

## 2016-11-14 NOTE — Telephone Encounter (Signed)
Left message for patient to return call for results of sleep study. Patient positive for sleep apnea. Auto titration cpap 5-20.

## 2016-11-14 NOTE — Progress Notes (Signed)
Patient aware orders placed.

## 2016-11-26 ENCOUNTER — Ambulatory Visit: Payer: 59 | Admitting: Obstetrics & Gynecology

## 2016-11-29 ENCOUNTER — Encounter: Payer: Self-pay | Admitting: Obstetrics & Gynecology

## 2016-11-29 ENCOUNTER — Ambulatory Visit (INDEPENDENT_AMBULATORY_CARE_PROVIDER_SITE_OTHER): Payer: 59 | Admitting: Obstetrics & Gynecology

## 2016-11-29 VITALS — BP 132/88 | HR 76 | Ht 64.0 in | Wt 256.0 lb

## 2016-11-29 DIAGNOSIS — Z124 Encounter for screening for malignant neoplasm of cervix: Secondary | ICD-10-CM

## 2016-11-29 DIAGNOSIS — Z87411 Personal history of vaginal dysplasia: Secondary | ICD-10-CM | POA: Insufficient documentation

## 2016-11-29 DIAGNOSIS — Z1211 Encounter for screening for malignant neoplasm of colon: Secondary | ICD-10-CM

## 2016-11-29 DIAGNOSIS — Z1231 Encounter for screening mammogram for malignant neoplasm of breast: Secondary | ICD-10-CM | POA: Diagnosis not present

## 2016-11-29 DIAGNOSIS — Z0000 Encounter for general adult medical examination without abnormal findings: Secondary | ICD-10-CM | POA: Diagnosis not present

## 2016-11-29 DIAGNOSIS — Z1239 Encounter for other screening for malignant neoplasm of breast: Secondary | ICD-10-CM

## 2016-11-29 NOTE — Progress Notes (Signed)
HPI:      [name] is a 61 y.o. G0P0000 who LMP was in the past, she presents today for her annual examination.  The patient has no complaints today. The patient is sexually active. Herlast pap: approximate date 2017 and was normal and has h/o abn vag PAP w LGSIL < 5 years ago and last mammogram: approximate date 2017 and was normal.  The patient does perform self breast exams.  There is no notable family history of breast or ovarian cancer in her family. The patient is not taking hormone replacement therapy. Patient denies post-menopausal vaginal bleeding.   The patient has regular exercise: yes. The patient denies current symptoms of depression.   Pt has some urinary freq and urgency and nocturia, not to point of desiring therapy. Works 3rd shift  GYN Hx: Last Colonoscopy:3 years ago. Normal. On 5 year plan. Last DEXA: never ago.    PMHx: Past Medical History:  Diagnosis Date  . Allergic rhinitis   . Arthritis    Right knee, right elbow  . Complex tear of medial meniscus of right knee 06/26/2016  . GERD (gastroesophageal reflux disease)   . Hypertension   . Hypothyroidism 2008  . Obstructive sleep apnea    CPAP   --   NOT ABLE TO USE   . Viral meningitis 5/08   [facility]   Past Surgical History:  Procedure Laterality Date  . ABDOMINAL HYSTERECTOMY  1982   Dr Vernie Ammons assisted by Dr Bary Castilla  . APPENDECTOMY     teenager  . BREAST BIOPSY Left 1999   [facility] Conn.  Marland Kitchen BREAST BIOPSY Left 2017   benign  . CHONDROPLASTY Right 06/26/2016   Procedure: CHONDROPLASTY;  Surgeon: Marchia Bond, MD;  Location: [facility];  Service: Orthopedics;  Laterality: Right;  . KNEE ARTHROSCOPY W/ LASER  8/07   Left  . KNEE ARTHROSCOPY WITH MEDIAL MENISECTOMY Right 06/26/2016   Procedure: RIGHT KNEE ARTHROSCOPY WITH MEDIAL MENISCECTOMY AND CHONDROPLASTY;  Surgeon: Marchia Bond, MD;  Location: [facility];  Service: Orthopedics;  Laterality: Right;  . TONSILLECTOMY       as child  . VESICOVAGINAL FISTULA CLOSURE W/ TAH  1987   Fibroids   Family History  Problem Relation Age of Onset  . Cancer Mother        mastectomy for breast cancer  . Cancer Paternal Uncle        pancreatic cancer  . Hypertension Unknown        both sides  . Cancer Unknown        maternal side  . Prostate cancer Maternal Grandfather   . Breast cancer Maternal Aunt   . Cancer Maternal Aunt        breast cancer  . Coronary artery disease Neg Hx   . Diabetes Neg Hx    Social History  Substance Use Topics  . Smoking status: Never Smoker  . Smokeless tobacco: Never Used  . Alcohol use Yes     Comment: beer on weekends- occasional    Current Outpatient Prescriptions:  .  baclofen (LIORESAL) 10 MG tablet, Take 1 tablet (10 mg total) by mouth 3 (three) times daily. As needed for muscle spasm, Disp: 50 tablet, Rfl: 0 .  furosemide (LASIX) 80 MG tablet, Take 1 tablet (80 mg total) by mouth daily as needed., Disp: 1 tablet, Rfl: 0 .  levothyroxine (SYNTHROID, LEVOTHROID) 50 MCG tablet, Take 1 tablet (50 mcg total) by mouth daily., Disp: 90  tablet, Rfl: 3 .  meloxicam (MOBIC) 15 MG tablet, Take 1 tablet by mouth daily., Disp: , Rfl:  .  naproxen sodium (ANAPROX) 220 MG tablet, Take 1 tablet by mouth 2 (two) times daily as needed., Disp: , Rfl:  .  omeprazole (PRILOSEC) 20 MG capsule, Take 1 capsule (20 mg total) by mouth daily., Disp: 90 capsule, Rfl: 3 .  ondansetron (ZOFRAN) 4 MG tablet, Take 1 tablet (4 mg total) by mouth every 8 (eight) hours as needed for nausea or vomiting., Disp: 30 tablet, Rfl: 0 .  sennosides-docusate sodium (SENOKOT-S) 8.6-50 MG tablet, Take 2 tablets by mouth daily., Disp: 30 tablet, Rfl: 1 Allergies: Patient has no known allergies.  Review of Systems  Constitutional: Negative for chills, fever and malaise/fatigue.  HENT: Negative for congestion, sinus pain and sore throat.   Eyes: Negative for blurred vision and pain.  Respiratory: Negative for cough  and wheezing.   Cardiovascular: Negative for chest pain and leg swelling.  Gastrointestinal: Negative for abdominal pain, constipation, diarrhea, heartburn, nausea and vomiting.  Genitourinary: Negative for dysuria, frequency, hematuria and urgency.  Musculoskeletal: Negative for back pain, joint pain, myalgias and neck pain.  Skin: Negative for itching and rash.  Neurological: Negative for dizziness, tremors and weakness.  Endo/Heme/Allergies: Does not bruise/bleed easily.  Psychiatric/Behavioral: Negative for depression. The patient is not nervous/anxious and does not have insomnia.     Objective: BP 132/88 (BP Location: Left Arm, Patient Position: Sitting, Cuff Size: Large)   Pulse 76   Ht 5\' 4"  (1.626 m)   Wt 256 lb (116.1 kg)   BMI 43.94 kg/m   Filed Weights   11/29/16 0807  Weight: 256 lb (116.1 kg)   Body mass index is 43.94 kg/m. Physical Exam  Constitutional: She is oriented to person, place, and time. She appears well-developed and well-nourished. No distress.  Genitourinary: Rectum normal and vagina normal. Pelvic exam was performed with patient supine. There is no rash or lesion on the right labia. There is no rash or lesion on the left labia. Vagina exhibits no lesion. No bleeding in the vagina. Right adnexum does not display mass and does not display tenderness. Left adnexum does not display mass and does not display tenderness.  Genitourinary Comments: Absent Uterus Absent cervix Vaginal cuff well healed  HENT:  Head: Normocephalic and atraumatic. Head is without laceration.  Right Ear: Hearing normal.  Left Ear: Hearing normal.  Nose: No epistaxis.  No foreign bodies.  Mouth/Throat: Uvula is midline, oropharynx is clear and moist and mucous membranes are normal.  Eyes: Pupils are equal, round, and reactive to light.  Neck: Normal range of motion. Neck supple. No thyromegaly present.  Cardiovascular: Normal rate and regular rhythm.  Exam reveals no gallop and no  friction rub.   No murmur heard. Pulmonary/Chest: Effort normal and breath sounds normal. No respiratory distress. She has no wheezes. Right breast exhibits no mass, no skin change and no tenderness. Left breast exhibits no mass, no skin change and no tenderness.  Abdominal: Soft. Bowel sounds are normal. She exhibits no distension. There is no tenderness. There is no rebound.  Musculoskeletal: Normal range of motion.  Neurological: She is alert and oriented to person, place, and time. No cranial nerve deficit.  Skin: Skin is warm and dry.  Psychiatric: She has a normal mood and affect. Judgment normal.  Vitals reviewed.   Assessment: Annual Exam 1. Annual physical exam   2. Screening for cervical cancer   3. Screening for  breast cancer   4. History of vaginal dysplasia   5. Screen for colon cancer     Plan:            1.  Cervical Screening-  Pap smear done today  2. Breast screening- Exam annually and mammogram scheduled  3. Colonoscopy every 10 years, Hemoccult testing after age 26  4. Labs managed by PCP  5. Counseling for hormonal therapy: none, no change in therapy today  6. Consider OAB tx in future; counseled as to options for tx (pill, implant).  SE of meds discussed.     F/U  Return in about 1 year (around 11/29/2017) for Annual.  Barnett Applebaum, MD, [facility], [facility] 11/29/2016  8:35 AM

## 2016-11-29 NOTE — Patient Instructions (Addendum)
PAP every year Mammogram every year    Call [phone] to schedule at [facility] Colonoscopy every 10 years Labs yearly (with PCP)   Overactive Bladder, Adult Overactive bladder is a group of urinary symptoms. With overactive bladder, you may suddenly feel the need to pass urine (urinate) right away. After feeling this sudden urge, you might also leak urine if you cannot get to the bathroom fast enough (urinary incontinence). These symptoms might interfere with your daily work or social activities. Overactive bladder symptoms may also wake you up at night. Overactive bladder affects the nerve signals between your bladder and your brain. Your bladder may get the signal to empty before it is full. Very sensitive muscles can also make your bladder squeeze too soon. What are the causes? Many things can cause an overactive bladder. Possible causes include:  Urinary tract infection.  Infection of nearby tissues, such as the prostate.  Prostate enlargement.  Being pregnant with twins or more (multiples).  Surgery on the uterus or urethra.  Bladder stones, inflammation, or tumors.  Drinking too much caffeine or alcohol.  Certain medicines, especially those that you take to help your body get rid of extra fluid (diuretics) by increasing urine production.  Muscle or nerve weakness, especially from: ? A spinal cord injury. ? Stroke. ? Multiple sclerosis. ? Parkinson disease.  Diabetes. This can cause a high urine volume that fills the bladder so quickly that the normal urge to urinate is triggered very strongly.  Constipation. A buildup of too much stool can put pressure on your bladder.  What increases the risk? You may be at greater risk for overactive bladder if you:  Are an older adult.  Smoke.  Are going through menopause.  Have prostate problems.  Have a neurological disease, such as stroke, dementia, Parkinson disease, or multiple sclerosis (MS).  Eat or drink things  that irritate the bladder. These include alcohol, spicy food, and caffeine.  Are overweight or obese.  What are the signs or symptoms? The signs and symptoms of an overactive bladder include:  Sudden, strong urges to urinate.  Leaking urine.  Urinating eight or more times per day.  Waking up to urinate two or more times per night.  How is this diagnosed? Your health care provider may suspect overactive bladder based on your symptoms. The health care provider will do a physical exam and take your medical history. Blood or urine tests may also be done. For example, you might need to have a bladder function test to check how well you can hold your urine. You might also need to see a health care provider who specializes in the urinary tract (urologist). How is this treated? Treatment for overactive bladder depends on the cause of your condition and whether it is mild or severe. Certain treatments can be done in your health care provider's office or clinic. You can also make lifestyle changes at home. Options include: Behavioral Treatments  Biofeedback. A specialist uses sensors to help you become aware of your body's signals.  Keeping a daily log of when you need to urinate and what happens after the urge. This may help you manage your condition.  Bladder training. This helps you learn to control the urge to urinate by following a schedule that directs you to urinate at regular intervals (timed voiding). At first, you might have to wait a few minutes after feeling the urge. In time, you should be able to schedule bathroom visits an hour or more apart.  Kegel  exercises. These are exercises to strengthen the pelvic floor muscles, which support the bladder. Toning these muscles can help you control urination, even if your bladder muscles are overactive. A specialist will teach you how to do these exercises correctly. They require daily practice.  Weight loss. If you are obese or overweight,  losing weight might relieve your symptoms of overactive bladder. Talk to your health care provider about losing weight and whether there is a specific program or method that would work best for you.  Diet change. This might help if constipation is making your overactive bladder worse. Your health care provider or a dietitian can explain ways to change what you eat to ease constipation. You might also need to consume less alcohol and caffeine or drink other fluids at different times of the day.  Stopping smoking.  Wearing pads to absorb leakage while you wait for other treatments to take effect. Physical Treatments  Electrical stimulation. Electrodes send gentle pulses of electricity to strengthen the nerves or muscles that help to control the bladder. Sometimes, the electrodes are placed outside of the body. In other cases, they might be placed inside the body (implanted). This treatment can take several months to have an effect.  Supportive devices. Women may need a plastic device that fits into the vagina and supports the bladder (pessary). Medicines Several medicines can help treat overactive bladder and are usually used along with other treatments. Some are injected into the muscles involved in urination. Others come in pill form. Your health care provider may prescribe:  Antispasmodics. These medicines block the signals that the nerves send to the bladder. This keeps the bladder from releasing urine at the wrong time.  Tricyclic antidepressants. These types of antidepressants also relax bladder muscles.  Surgery  You may have a device implanted to help manage the nerve signals that indicate when you need to urinate.  You may have surgery to implant electrodes for electrical stimulation.  Sometimes, very severe cases of overactive bladder require surgery to change the shape of the bladder. Follow these instructions at home:  Take medicines only as directed by your health care  provider.  Use any implants or a pessary as directed by your health care provider.  Make any diet or lifestyle changes that are recommended by your health care provider. These might include: ? Drinking less fluid or drinking at different times of the day. If you need to urinate often during the night, you may need to stop drinking fluids early in the evening. ? Cutting down on caffeine or alcohol. Both can make an overactive bladder worse. Caffeine is found in coffee, tea, and sodas. ? Doing Kegel exercises to strengthen muscles. ? Losing weight if you need to. ? Eating a healthy and balanced diet to prevent constipation.  Keep a journal or log to track how much and when you drink and also when you feel the need to urinate. This will help your health care provider to monitor your condition. Contact a health care provider if:  Your symptoms do not get better after treatment.  Your pain and discomfort are getting worse.  You have more frequent urges to urinate.  You have a fever. Get help right away if: You are not able to control your bladder at all. This information is not intended to replace advice given to you by your health care provider. Make sure you discuss any questions you have with your health care provider. Document Released: 02/03/2009 Document Revised: 09/15/2015 Document Reviewed:  09/02/2013 Elsevier Interactive Patient Education  [employer].

## 2016-11-30 LAB — PAP IG (IMAGE GUIDED): PAP SMEAR COMMENT: 0

## 2016-12-08 LAB — FECAL OCCULT BLOOD, IMMUNOCHEMICAL: Fecal Occult Bld: NEGATIVE

## 2016-12-08 LAB — PLEASE NOTE

## 2016-12-31 ENCOUNTER — Ambulatory Visit
Admission: RE | Admit: 2016-12-31 | Discharge: 2016-12-31 | Disposition: A | Discharge status: Home / Self Care | Payer: 59 | Source: Ambulatory Visit | Attending: Obstetrics & Gynecology | Admitting: Obstetrics & Gynecology

## 2016-12-31 DIAGNOSIS — Z1231 Encounter for screening mammogram for malignant neoplasm of breast: Secondary | ICD-10-CM | POA: Diagnosis present

## 2016-12-31 DIAGNOSIS — Z1239 Encounter for other screening for malignant neoplasm of breast: Secondary | ICD-10-CM

## 2017-01-03 ENCOUNTER — Inpatient Hospital Stay
Admission: RE | Admit: 2017-01-03 | Discharge: 2017-01-03 | Disposition: A | Discharge status: Home / Self Care | Payer: Self-pay | Source: Ambulatory Visit | Attending: *Deleted | Admitting: *Deleted

## 2017-01-03 ENCOUNTER — Other Ambulatory Visit: Payer: Self-pay | Admitting: *Deleted

## 2017-01-03 DIAGNOSIS — Z9289 Personal history of other medical treatment: Secondary | ICD-10-CM

## 2017-01-07 ENCOUNTER — Encounter: Payer: Self-pay | Admitting: Obstetrics & Gynecology

## 2017-01-08 ENCOUNTER — Encounter: Payer: Self-pay | Admitting: Internal Medicine

## 2017-01-10 NOTE — Progress Notes (Signed)
[facility] Consultation      Assessment and Plan:  Obstructive sleep apnea. -Sleep study 10/29/16: AHI equals 15, started on AutoPap range 5-20. --Continue sleepiness symptoms due to inadequate sleep (short sleep) of less than 4 hours on several days.  --Shift workers sleep disorder, likely contributing to sleepiness. She is currently sleeping 4 hours after 3rd shift and not sleeping any more before she goes to work. Recommend that she try to get in a nap of 1-2 hours before going to work and using the PAP at that time.   Obesity. -Likely contributory, weight loss may be beneficial.   Date: 01/10/2017  MRN# [id number] JUNELLE HASHEMI 08-31-1955    Referring Physician:  Review home sleep study report 12/04/08: DELILA KUKLINSKI is a 61 y.o. old female seen in consultation for chief complaint of:    Chief Complaint  Patient presents with  . Sleep Apnea    Pt does work 3rd shift and is wearing cpap almost every night. She keeps getting text about usage.    HPI:   Patient is 61 year old female with a history of obstructive sleep apnea. Patient was diagnosed with obstructive sleep apnea, possibly 10 years ago per patient, she was noncompliant and had to be retested and noted to be restarted on CPAP. She works 3rd shift, she gets home at 46 am, then she sleeps with the PAP, wakes at 1pm. She leaves for work at [employer].  Then does not go back to sleep until the next day. Some days she sleeps minimally.   Review home sleep study report 12/04/08: Sleep latency 12 minutes. AHI equals 14.  **Download data 30 days as of 01/08/17, average usage on days used as 3 hours 11 minutes, usage greater than 4 hours is 6 days (20%). Pressure is 5-20. Residual AHI is 4.4. 95th percentile pressure was 10.6, maximum is 11.9.  Medication:    Current Outpatient Prescriptions:  .  baclofen (LIORESAL) 10 MG tablet, Take 1 tablet (10 mg total) by mouth 3 (three) times daily. As needed for muscle  spasm, Disp: 50 tablet, Rfl: 0 .  furosemide (LASIX) 80 MG tablet, Take 1 tablet (80 mg total) by mouth daily as needed., Disp: 1 tablet, Rfl: 0 .  levothyroxine (SYNTHROID, LEVOTHROID) 50 MCG tablet, Take 1 tablet (50 mcg total) by mouth daily., Disp: 90 tablet, Rfl: 3 .  meloxicam (MOBIC) 15 MG tablet, Take 1 tablet by mouth daily., Disp: , Rfl:  .  naproxen sodium (ANAPROX) 220 MG tablet, Take 1 tablet by mouth 2 (two) times daily as needed., Disp: , Rfl:  .  omeprazole (PRILOSEC) 20 MG capsule, Take 1 capsule (20 mg total) by mouth daily., Disp: 90 capsule, Rfl: 3 .  ondansetron (ZOFRAN) 4 MG tablet, Take 1 tablet (4 mg total) by mouth every 8 (eight) hours as needed for nausea or vomiting., Disp: 30 tablet, Rfl: 0 .  sennosides-docusate sodium (SENOKOT-S) 8.6-50 MG tablet, Take 2 tablets by mouth daily., Disp: 30 tablet, Rfl: 1   Allergies:  Patient has no known allergies.  Review of Systems: Gen:  Denies  fever, sweats, chills HEENT: Denies blurred vision, double vision. bleeds, sore throat Cvc:  No dizziness, chest pain. Resp:   Denies cough or sputum production, shortness of breath Gi: Denies swallowing difficulty, stomach pain. Gu:  Denies bladder incontinence, burning urine Ext:   No Joint pain, stiffness. Skin: No skin rash,  hives  Endoc:  No polyuria, polydipsia. Psych: No depression, insomnia. Other:  All other systems were reviewed with the patient and were negative other that what is mentioned in the HPI.   Physical Examination:   VS: BP (!) 132/102 (BP Location: Left Arm, Cuff Size: Large)   Pulse 82   Resp 16   Ht 5\' 4"  (1.626 m)   Wt 253 lb (114.8 kg)   SpO2 97%   BMI 43.43 kg/m   General Appearance: No distress  Neuro:without focal findings,  speech normal,  HEENT: PERRLA, EOM intact.   Pulmonary: normal breath sounds, No wheezing.  CardiovascularNormal S1,S2.  No m/r/g.   Abdomen: Benign, Soft, non-tender. Renal:  No costovertebral tenderness  GU:  No  performed at this time. Endoc: No evident thyromegaly, no signs of acromegaly. Skin:   warm, no rashes, no ecchymosis  Extremities: normal, no cyanosis, clubbing.  Other findings:    LABORATORY PANEL:   CBC No results for input(s): WBC, HGB, HCT, PLT in the last 168 hours. ------------------------------------------------------------------------------------------------------------------  Chemistries  No results for input(s): NA, K, CL, CO2, GLUCOSE, BUN, CREATININE, CALCIUM, MG, AST, ALT, ALKPHOS, BILITOT in the last 168 hours.  Invalid input(s): GFRCGP ------------------------------------------------------------------------------------------------------------------  Cardiac Enzymes No results for input(s): TROPONINI in the last 168 hours. ------------------------------------------------------------  RADIOLOGY:  No results found.     Thank  you for the consultation and for allowing [facility] to assist in the care of your patient. Our recommendations are noted above.  Please contact us if we can be of further service.   Marda Stalker, MD.  Board Certified in Internal Medicine, Pulmonary Medicine, [place], and Sleep Medicine.  [facility] Office Number: [phone]  Patricia Pesa, M.D.  Merton Border, M.D  01/10/2017

## 2017-01-15 ENCOUNTER — Encounter: Payer: Self-pay | Admitting: Internal Medicine

## 2017-01-15 ENCOUNTER — Ambulatory Visit (INDEPENDENT_AMBULATORY_CARE_PROVIDER_SITE_OTHER): Payer: 59 | Admitting: Internal Medicine

## 2017-01-15 VITALS — BP 132/102 | HR 82 | Resp 16 | Ht 64.0 in | Wt 253.0 lb

## 2017-01-15 DIAGNOSIS — G4733 Obstructive sleep apnea (adult) (pediatric): Secondary | ICD-10-CM | POA: Diagnosis not present

## 2017-01-15 NOTE — Addendum Note (Signed)
Addended by: Oscar La R on: 01/15/2017 11:05 AM   Modules accepted: Orders

## 2017-01-15 NOTE — Patient Instructions (Signed)
--  Try to get at least 6 hours of sleep per 24 hours, start by trying to take a nap in the afternoon before going to work.

## 2017-01-21 ENCOUNTER — Telehealth: Payer: Self-pay | Admitting: Internal Medicine

## 2017-01-21 NOTE — Telephone Encounter (Signed)
Patient brought in a Health form to be filled out by the Provider.  The form was placed in the Provider's prescription incoming box.

## 2017-01-23 NOTE — Telephone Encounter (Signed)
I left a message on patient's voice mail that form is ready for pick up and no charge.

## 2017-01-23 NOTE — Telephone Encounter (Signed)
Form done No charge 

## 2017-01-23 NOTE — Telephone Encounter (Signed)
Form is a dispute for BMI from recent CPE 09-28-16. Form placed in Dr Alla German Inbox on his desk.

## 2017-01-24 ENCOUNTER — Ambulatory Visit: Payer: 59 | Admitting: Internal Medicine

## 2017-04-04 ENCOUNTER — Encounter: Payer: Self-pay | Admitting: Internal Medicine

## 2017-04-04 ENCOUNTER — Telehealth: Payer: Self-pay | Admitting: Internal Medicine

## 2017-04-04 NOTE — Telephone Encounter (Signed)
l mom to call and r/s appt for 12/20, Dr. Juanell Fairly out of the office

## 2017-04-05 NOTE — Progress Notes (Deleted)
[facility] Consultation      Assessment and Plan:  Obstructive sleep apnea. -Sleep study 10/29/16: AHI equals 15, started on AutoPap range 5-20. --Continue sleepiness symptoms due to inadequate sleep (short sleep) of less than 4 hours on several days.  --Shift workers sleep disorder, likely contributing to sleepiness. She is currently sleeping 4 hours after 3rd shift and not sleeping any more before she goes to work. Recommend that she try to get in a nap of 1-2 hours before going to work and using the PAP at that time.   Obesity. -Likely contributory, weight loss may be beneficial.   Date: 04/05/2017  MRN# [id number] Sabrina [name] 18-Jul-1955    Referring Physician:  Review home sleep study report 12/04/08: Sabrina [name] is a 61 y.o. old female seen in consultation for chief complaint of:    No chief complaint on file.   HPI:   Patient is 61 year old female with a history of obstructive sleep apnea. Patient was diagnosed with obstructive sleep apnea, possibly 10 years ago per patient, she was noncompliant and had to be retested and noted to be restarted on CPAP. She works 3rd shift, she gets home at 47 am, then she sleeps with the PAP, wakes at 1pm. She leaves for work at [employer].  Then does not go back to sleep until the next day. Some days she sleeps minimally.   Review home sleep study report 12/04/08: Sleep latency 12 minutes. AHI equals 14.  **Download data 30 days as of 01/08/17, average usage on days used as 3 hours 11 minutes, usage greater than 4 hours is 6 days (20%). Pressure is 5-20. Residual AHI is 4.4. 95th percentile pressure was 10.6, maximum is 11.9.  Medication:    Current Outpatient Medications:  .  baclofen (LIORESAL) 10 MG tablet, Take 1 tablet (10 mg total) by mouth 3 (three) times daily. As needed for muscle spasm, Disp: 50 tablet, Rfl: 0 .  furosemide (LASIX) 80 MG tablet, Take 1 tablet (80 mg total) by mouth daily as needed., Disp: 1  tablet, Rfl: 0 .  levothyroxine (SYNTHROID, LEVOTHROID) 50 MCG tablet, Take 1 tablet (50 mcg total) by mouth daily., Disp: 90 tablet, Rfl: 3 .  meloxicam (MOBIC) 15 MG tablet, Take 1 tablet by mouth daily., Disp: , Rfl:  .  naproxen sodium (ANAPROX) 220 MG tablet, Take 1 tablet by mouth 2 (two) times daily as needed., Disp: , Rfl:  .  omeprazole (PRILOSEC) 20 MG capsule, Take 1 capsule (20 mg total) by mouth daily., Disp: 90 capsule, Rfl: 3 .  ondansetron (ZOFRAN) 4 MG tablet, Take 1 tablet (4 mg total) by mouth every 8 (eight) hours as needed for nausea or vomiting., Disp: 30 tablet, Rfl: 0 .  sennosides-docusate sodium (SENOKOT-S) 8.6-50 MG tablet, Take 2 tablets by mouth daily., Disp: 30 tablet, Rfl: 1   Allergies:  Patient has no known allergies.  Review of Systems: Gen:  Denies  fever, sweats, chills HEENT: Denies blurred vision, double vision. bleeds, sore throat Cvc:  No dizziness, chest pain. Resp:   Denies cough or sputum production, shortness of breath Gi: Denies swallowing difficulty, stomach pain. Gu:  Denies bladder incontinence, burning urine Ext:   No Joint pain, stiffness. Skin: No skin rash,  hives  Endoc:  No polyuria, polydipsia. Psych: No depression, insomnia. Other:  All other systems were reviewed with the patient and were negative other that what is mentioned in the HPI.   Physical Examination:   VS:  There were no vitals taken for this visit.  General Appearance: No distress  Neuro:without focal findings,  speech normal,  HEENT: PERRLA, EOM intact.   Pulmonary: normal breath sounds, No wheezing.  CardiovascularNormal S1,S2.  No m/r/g.   Abdomen: Benign, Soft, non-tender. Renal:  No costovertebral tenderness  GU:  No performed at this time. Endoc: No evident thyromegaly, no signs of acromegaly. Skin:   warm, no rashes, no ecchymosis  Extremities: normal, no cyanosis, clubbing.  Other findings:    LABORATORY PANEL:   CBC No results for input(s): WBC,  HGB, HCT, PLT in the last 168 hours. ------------------------------------------------------------------------------------------------------------------  Chemistries  No results for input(s): NA, K, CL, CO2, GLUCOSE, BUN, CREATININE, CALCIUM, MG, AST, ALT, ALKPHOS, BILITOT in the last 168 hours.  Invalid input(s): GFRCGP ------------------------------------------------------------------------------------------------------------------  Cardiac Enzymes No results for input(s): TROPONINI in the last 168 hours. ------------------------------------------------------------  RADIOLOGY:  No results found.     Thank  you for the consultation and for allowing [facility] to assist in the care of your patient. Our recommendations are noted above.  Please contact us if we can be of further service.   Marda Stalker, MD.  Board Certified in Internal Medicine, Pulmonary Medicine, [place], and Sleep Medicine.  [facility] Office Number: [phone]  Patricia Pesa, M.D.  Merton Border, M.D  04/05/2017

## 2017-04-08 ENCOUNTER — Encounter: Payer: Self-pay | Admitting: Internal Medicine

## 2017-04-08 ENCOUNTER — Ambulatory Visit: Payer: 59 | Admitting: Internal Medicine

## 2017-04-08 VITALS — BP 124/82 | HR 93 | Ht 64.0 in | Wt 255.0 lb

## 2017-04-08 DIAGNOSIS — G4733 Obstructive sleep apnea (adult) (pediatric): Secondary | ICD-10-CM | POA: Diagnosis not present

## 2017-04-08 NOTE — Progress Notes (Signed)
[facility] Consultation      Assessment and Plan:  Obstructive sleep apnea. -Sleep study 10/29/16: AHI equals 15, started on AutoPap range 5-20. --Continue sleepiness symptoms due to inadequate sleep (short sleep) of less than 4 hours on several days.  --Shift workers sleep disorder, likely contributing to sleepiness. She is currently sleeping 4 hours after 3rd shift and not sleeping any more before she goes to work. Recommend that she try to get in a nap of 1-2 hours before going to work and using the PAP at that time.   Obesity. -Likely contributory, weight loss may be beneficial.   Date: 04/08/2017  MRN# [id number] Sabrina [name] 08-Aug-1955    Sabrina [name] is a 61 y.o. old female seen in consultation for chief complaint of:    Chief Complaint  Patient presents with  . Sleep Apnea    doing well on CPAP. no issues:    HPI:   Patient is 61 year old female with a history of obstructive sleep apnea. Patient was diagnosed with obstructive sleep apnea, possibly 10 years ago per patient, she was noncompliant and had to be retested and noted to be restarted on CPAP. She works 3rd shift, she gets home at 28 am, then she sleeps with the PAP, wakes at 1pm. She leaves for work at [employer].  Then does not go back to sleep until the next day. Some days she sleeps minimally.   She feels that she is doing well with the pap, but her nose is dry.   **Download data 30 days as of 04/04/17; usage greater than 4 hours is 21/30 days.  Average usage on days used is 4 hours 21 minutes.  Setting is 5-20.  Median pressure is 8, 95th percentile pressure is 10, maximum pressure 12, residual AHI is 2.8. **Download data 30 days as of 01/08/17, average usage on days used as 3 hours 11 minutes, usage greater than 4 hours is 6 days (20%). Pressure is 5-20. Residual AHI is 4.4. 95th percentile pressure was 10.6, maximum is 11.9.  Medication:    Current Outpatient Medications:  .  baclofen  (LIORESAL) 10 MG tablet, Take 1 tablet (10 mg total) by mouth 3 (three) times daily. As needed for muscle spasm, Disp: 50 tablet, Rfl: 0 .  furosemide (LASIX) 80 MG tablet, Take 1 tablet (80 mg total) by mouth daily as needed., Disp: 1 tablet, Rfl: 0 .  levothyroxine (SYNTHROID, LEVOTHROID) 50 MCG tablet, Take 1 tablet (50 mcg total) by mouth daily., Disp: 90 tablet, Rfl: 3 .  meloxicam (MOBIC) 15 MG tablet, Take 1 tablet by mouth daily., Disp: , Rfl:  .  naproxen sodium (ANAPROX) 220 MG tablet, Take 1 tablet by mouth 2 (two) times daily as needed., Disp: , Rfl:  .  omeprazole (PRILOSEC) 20 MG capsule, Take 1 capsule (20 mg total) by mouth daily., Disp: 90 capsule, Rfl: 3 .  ondansetron (ZOFRAN) 4 MG tablet, Take 1 tablet (4 mg total) by mouth every 8 (eight) hours as needed for nausea or vomiting., Disp: 30 tablet, Rfl: 0 .  sennosides-docusate sodium (SENOKOT-S) 8.6-50 MG tablet, Take 2 tablets by mouth daily., Disp: 30 tablet, Rfl: 1   Allergies:  Patient has no known allergies.  Review of Systems: Gen:  Denies  fever, sweats, chills HEENT: Denies blurred vision, double vision. bleeds, sore throat Cvc:  No dizziness, chest pain. Resp:   Denies cough or sputum production, shortness of breath Gi: Denies swallowing difficulty, stomach pain. Gu:  Denies bladder incontinence, burning urine Ext:   No Joint pain, stiffness. Skin: No skin rash,  hives  Endoc:  No polyuria, polydipsia. Psych: No depression, insomnia. Other:  All other systems were reviewed with the patient and were negative other that what is mentioned in the HPI.   Physical Examination:   VS: BP 124/82 (BP Location: Left Arm, Cuff Size: Large)   Pulse 93   Ht 5\' 4"  (1.626 m)   Wt 255 lb (115.7 kg)   SpO2 97%   BMI 43.77 kg/m   General Appearance: No distress  Neuro:without focal findings,  speech normal,  HEENT: PERRLA, EOM intact.   Pulmonary: normal breath sounds, No wheezing.  CardiovascularNormal S1,S2.  No  m/r/g.   Abdomen: Benign, Soft, non-tender. Renal:  No costovertebral tenderness  GU:  No performed at this time. Endoc: No evident thyromegaly, no signs of acromegaly. Skin:   warm, no rashes, no ecchymosis  Extremities: normal, no cyanosis, clubbing.  Other findings:    LABORATORY PANEL:   CBC No results for input(s): WBC, HGB, HCT, PLT in the last 168 hours. ------------------------------------------------------------------------------------------------------------------  Chemistries  No results for input(s): NA, K, CL, CO2, GLUCOSE, BUN, CREATININE, CALCIUM, MG, AST, ALT, ALKPHOS, BILITOT in the last 168 hours.  Invalid input(s): GFRCGP ------------------------------------------------------------------------------------------------------------------  Cardiac Enzymes No results for input(s): TROPONINI in the last 168 hours. ------------------------------------------------------------  RADIOLOGY:  No results found.     Thank  you for the consultation and for allowing [facility] to assist in the care of your patient. Our recommendations are noted above.  Please contact us if we can be of further service.   Marda Stalker, MD.  Board Certified in Internal Medicine, Pulmonary Medicine, [place], and Sleep Medicine.  [facility] Office Number: [phone]  Patricia Pesa, M.D.  Merton Border, M.D  04/08/2017

## 2017-04-08 NOTE — Patient Instructions (Signed)
--  Try to use your CPAP every night.   --Call your supplier to see how you can adjust your humidity.

## 2017-04-11 ENCOUNTER — Ambulatory Visit: Payer: 59 | Admitting: Internal Medicine

## 2017-05-02 ENCOUNTER — Telehealth: Payer: Self-pay | Admitting: Internal Medicine

## 2017-05-02 NOTE — Telephone Encounter (Signed)
Copied from [place] [id number]. Topic: Quick Communication - See Telephone Encounter >> May 02, 2017  4:12 PM Burnis Medin, NT wrote: CRM for notification. See Telephone encounter for: Pt called in and wanted to see if the doctor can call her something in. Pt has been having a sore throat and cough for days. Pt uses [facility] on [address]  05/02/17.

## 2017-05-03 NOTE — Telephone Encounter (Signed)
Called pt for additional information re: symptoms.  Left VM.

## 2017-05-14 DIAGNOSIS — G4733 Obstructive sleep apnea (adult) (pediatric): Secondary | ICD-10-CM | POA: Diagnosis not present

## 2017-05-20 ENCOUNTER — Encounter: Payer: Self-pay | Admitting: Internal Medicine

## 2017-05-20 ENCOUNTER — Encounter: Payer: Self-pay | Admitting: Physician Assistant

## 2017-05-20 ENCOUNTER — Ambulatory Visit: Payer: BLUE CROSS/BLUE SHIELD | Admitting: Internal Medicine

## 2017-05-20 VITALS — BP 124/78 | HR 86 | Temp 98.0°F | Wt 255.0 lb

## 2017-05-20 DIAGNOSIS — K625 Hemorrhage of anus and rectum: Secondary | ICD-10-CM | POA: Diagnosis not present

## 2017-05-20 MED ORDER — HYDROCORTISONE 2.5 % EX CREA: TOPICAL_CREAM | Freq: Three times a day (TID) | CUTANEOUS | 3 refills | Status: DC | PRN

## 2017-05-20 MED ORDER — BENZONATATE 200 MG PO CAPS: 200.0000 mg | ORAL_CAPSULE | Freq: Three times a day (TID) | ORAL | 0 refills | Status: DC | PRN

## 2017-05-20 NOTE — Progress Notes (Signed)
Subjective:    Patient ID: Sabrina [name], female    DOB: [birth year], 62 y.o.   MRN: [id number]  HPI Here due to rectal bleeding  Saw some blood last week--- on toilet paper Now having pain but blood is better  Had pain 3 months ago--improved so didn't come in  Pain really bad at work 4 days ago Aleve helped Then bad pain recurred this am  Bowels have been regular No fever  Current Outpatient Medications on File Prior to Visit  Medication Sig Dispense Refill  . baclofen (LIORESAL) 10 MG tablet Take 1 tablet (10 mg total) by mouth 3 (three) times daily. As needed for muscle spasm 50 tablet 0  . furosemide (LASIX) 80 MG tablet Take 1 tablet (80 mg total) by mouth daily as needed. 1 tablet 0  . levothyroxine (SYNTHROID, LEVOTHROID) 50 MCG tablet Take 1 tablet (50 mcg total) by mouth daily. 90 tablet 3  . meloxicam (MOBIC) 15 MG tablet Take 1 tablet by mouth daily.    . naproxen sodium (ANAPROX) 220 MG tablet Take 1 tablet by mouth 2 (two) times daily as needed.    Marland Kitchen omeprazole (PRILOSEC) 20 MG capsule Take 1 capsule (20 mg total) by mouth daily. 90 capsule 3  . ondansetron (ZOFRAN) 4 MG tablet Take 1 tablet (4 mg total) by mouth every 8 (eight) hours as needed for nausea or vomiting. 30 tablet 0  . sennosides-docusate sodium (SENOKOT-S) 8.6-50 MG tablet Take 2 tablets by mouth daily. 30 tablet 1   No current facility-administered medications on file prior to visit.     No Known Allergies  Past Medical History:  Diagnosis Date  . Allergic rhinitis   . Arthritis    Right knee, right elbow  . Complex tear of medial meniscus of right knee 06/26/2016  . GERD (gastroesophageal reflux disease)   . Hypertension   . Hypothyroidism 2008  . Obstructive sleep apnea    CPAP   --   NOT ABLE TO USE   . Viral meningitis 5/08   [facility]    Past Surgical History:  Procedure Laterality Date  . ABDOMINAL HYSTERECTOMY  1982   Dr Vernie Ammons assisted by Dr Bary Castilla  . APPENDECTOMY     teenager    . BREAST BIOPSY Left 03/09/2015   benign/ Dr. Bary Castilla done in office NEG  . BREAST EXCISIONAL BIOPSY Left 1999   [facility]  . CHONDROPLASTY Right 06/26/2016   Procedure: CHONDROPLASTY;  Surgeon: Marchia Bond, MD;  Location: [facility];  Service: Orthopedics;  Laterality: Right;  . KNEE ARTHROSCOPY W/ LASER  8/07   Left  . KNEE ARTHROSCOPY WITH MEDIAL MENISECTOMY Right 06/26/2016   Procedure: RIGHT KNEE ARTHROSCOPY WITH MEDIAL MENISCECTOMY AND CHONDROPLASTY;  Surgeon: Marchia Bond, MD;  Location: [facility];  Service: Orthopedics;  Laterality: Right;  . TONSILLECTOMY     as child  . VESICOVAGINAL FISTULA CLOSURE W/ TAH  1987   Fibroids    Family History  Problem Relation Age of Onset  . Cancer Mother        mastectomy for breast cancer  . Breast cancer Mother 61  . Cancer Paternal Uncle        pancreatic cancer  . Hypertension Unknown        both sides  . Cancer Unknown        maternal side  . Prostate cancer Maternal Grandfather   . Breast cancer Maternal Aunt 71  . Cancer Maternal  Aunt        breast cancer  . Breast cancer Maternal Aunt 60  . Coronary artery disease Neg Hx   . Diabetes Neg Hx     Social History   Socioeconomic History  . Marital status: Married    Spouse name: Not on file  . Number of children: Not on file  . Years of education: Not on file  . Highest education level: Not on file  Social Needs  . Financial resource strain: Not on file  . Food insecurity - worry: Not on file  . Food insecurity - inability: Not on file  . Transportation needs - medical: Not on file  . Transportation needs - non-medical: Not on file  Occupational History  . Occupation: [employer] in [employer].     Employer: [employer]  . Occupation: Caregiver --part time    Comment: [employer]  Tobacco Use  . Smoking status: Never Smoker  . Smokeless tobacco: Never Used  Substance and Sexual Activity  . Alcohol use: Yes    Comment:  beer on weekends- occasional  . Drug use: No  . Sexual activity: Yes    Birth control/protection: None  Other Topics Concern  . Not on file  Social History Narrative   From [place].   Moved to [place] in [place] school then back here.     Review of Systems Appetite is down some-- goes a long time without eating for past 2 weeks Weight is stable No abdominal pain Recent cough/respiratory illness---still some cough    Objective:   Physical Exam  Abdominal: Soft.  Mild LLQ tenderness  Genitourinary:  Genitourinary Comments: No rectal lesions but was sensitive No obvious internal hemorrhoids either          Assessment & Plan:

## 2017-05-20 NOTE — Assessment & Plan Note (Signed)
And abdominal pain Not really consistent with IBD but this is certainly possible Will check labs GI referral---close to time for colonoscopy anyway so probably should get this soon Try 2.5% hydrocortisone cream

## 2017-05-31 ENCOUNTER — Ambulatory Visit: Payer: 59 | Admitting: Physician Assistant

## 2017-06-10 DIAGNOSIS — S83241D Other tear of medial meniscus, current injury, right knee, subsequent encounter: Secondary | ICD-10-CM | POA: Diagnosis not present

## 2017-06-12 ENCOUNTER — Encounter: Payer: Self-pay | Admitting: Physician Assistant

## 2017-06-12 ENCOUNTER — Ambulatory Visit: Payer: 59 | Admitting: Physician Assistant

## 2017-06-12 VITALS — BP 130/104 | HR 60 | Ht 64.0 in | Wt 255.2 lb

## 2017-06-12 DIAGNOSIS — K625 Hemorrhage of anus and rectum: Secondary | ICD-10-CM | POA: Diagnosis not present

## 2017-06-12 DIAGNOSIS — K6289 Other specified diseases of anus and rectum: Secondary | ICD-10-CM

## 2017-06-12 MED ORDER — HYDROCORTISONE ACETATE 25 MG RE SUPP: 25.0000 mg | Freq: Every day | RECTAL | 2 refills | Status: DC

## 2017-06-12 MED ORDER — NA SULFATE-K SULFATE-MG SULF 17.5-3.13-1.6 GM/177ML PO SOLN: 1.0000 | Freq: Once | ORAL | 0 refills | Status: AC

## 2017-06-12 NOTE — Progress Notes (Signed)
Reviewed and agree with documentation and assessment and plan. K. Veena Kisa Fujii , MD   

## 2017-06-12 NOTE — Patient Instructions (Signed)
If you are age 60 or older, your body mass index should be between 23-30. Your Body mass index is 43.81 kg/m. If this is out of the aforementioned range listed, please consider follow up with your Primary Care Provider.  If you are age 47 or younger, your body mass index should be between 19-25. Your Body mass index is 43.81 kg/m. If this is out of the aformentioned range listed, please consider follow up with your Primary Care Provider.   You have been scheduled for a colonoscopy. Please follow written instructions given to you at your visit today.  Please pick up your prep supplies at the pharmacy within the next 1-3 days. If you use inhalers (even only as needed), please bring them with you on the day of your procedure. Your physician has requested that you go to www.[email or web address].[email or web address] and enter the access code given to you at your visit today. This web site gives a general overview about your procedure. However, you should still follow specific instructions given to you by our office regarding your preparation for the procedure.  Thank you for choosing [facility]

## 2017-06-12 NOTE — Progress Notes (Signed)
Subjective:    Patient ID: Sabrina [name], female    DOB: 15-Oct-1955, 62 y.o.   MRN: [id number]  HPI Sabrina [name] is a pleasant 62 year old African-American female, known previously to Dr. Olevia [name] who is referred today by Dr. Silvio [name] for evaluation of recent rectal bleeding. Patient had undergone colonoscopy in 2010 by Dr. Olevia [name] which was a normal exam with no internal hemorrhoids noted. She says her current symptoms started about a month ago, she noticed bright red blood on the tissue and a little bit of blood in the commode on one occasion. She has not had any associated abdominal pain,'s feels that her bowel movements have been normal without constipation. She has been noticing some intermittent sharp internal rectal pain, that she says started the day before she saw the blood. She denies having any pain precipitated by bowel movements. She did have rectal exam done by Dr. Silvio [name] which was negative. Family history is negative for colon cancer.  Review of Systems Pertinent positive and negative review of systems were noted in the above HPI section.  All other review of systems was otherwise negative.  Outpatient Encounter Medications as of 06/12/2017  Medication Sig  . benzonatate (TESSALON) 200 MG capsule Take 1 capsule (200 mg total) by mouth 3 (three) times daily as needed for cough.  . furosemide (LASIX) 80 MG tablet Take 1 tablet (80 mg total) by mouth daily as needed.  . hydrocortisone 2.5 % cream Apply topically 3 (three) times daily as needed.  Marland Kitchen levothyroxine (SYNTHROID, LEVOTHROID) 50 MCG tablet Take 1 tablet (50 mcg total) by mouth daily.  . naproxen sodium (ANAPROX) 220 MG tablet Take 1 tablet by mouth 2 (two) times daily as needed.  Marland Kitchen omeprazole (PRILOSEC) 20 MG capsule Take 1 capsule (20 mg total) by mouth daily.  . sennosides-docusate sodium (SENOKOT-S) 8.6-50 MG tablet Take 2 tablets by mouth daily.  . hydrocortisone (ANUSOL-HC) 25 MG suppository Place 1 suppository (25 mg total)  rectally at bedtime. One every night for 2 weeks. Then at night as needed.  . Na Sulfate-K Sulfate-Mg Sulf 17.5-3.13-1.6 GM/177ML SOLN Take 1 kit by mouth once for 1 dose.  . [DISCONTINUED] baclofen (LIORESAL) 10 MG tablet Take 1 tablet (10 mg total) by mouth 3 (three) times daily. As needed for muscle spasm  . [DISCONTINUED] meloxicam (MOBIC) 15 MG tablet Take 1 tablet by mouth daily.  . [DISCONTINUED] ondansetron (ZOFRAN) 4 MG tablet Take 1 tablet (4 mg total) by mouth every 8 (eight) hours as needed for nausea or vomiting.   No facility-administered encounter medications on file as of 06/12/2017.    No Known Allergies Patient Active Problem List   Diagnosis Date Noted  . Rectal bleeding 05/20/2017  . History of vaginal dysplasia 11/29/2016  . Osteoarthritis of knee 09/23/2013  . Dysphagia, pharyngoesophageal phase 09/16/2013  . Routine general medical examination at a health care facility 02/27/2011  . Chronic venous insufficiency 11/14/2009  . ALLERGIC RHINITIS 09/30/2009  . OBSTRUCTIVE SLEEP APNEA 10/13/2008  . Hypothyroidism 11/14/2006  . GERD 11/14/2006   Social History   Socioeconomic History  . Marital status: Married    Spouse name: Not on file  . Number of children: 0  . Years of education: Not on file  . Highest education level: Not on file  Social Needs  . Financial resource strain: Not on file  . Food insecurity - worry: Not on file  . Food insecurity - inability: Not on file  . Transportation needs - medical: Not  on file  . Transportation needs - non-medical: Not on file  Occupational History  . Occupation: [employer] in [employer].     Employer: [employer]  . Occupation: Caregiver --part time    Comment: [employer]  Tobacco Use  . Smoking status: Never Smoker  . Smokeless tobacco: Never Used  Substance and Sexual Activity  . Alcohol use: Yes    Comment: beer on weekends- occasional  . Drug use: No  . Sexual activity: Yes    Birth control/protection:  None  Other Topics Concern  . Not on file  Social History Narrative   From [place].   Moved to [place] in high school then back here.      [name] family history includes Breast cancer (age of onset: 75) in her maternal aunt; Breast cancer (age of onset: 60) in her maternal aunt; Breast cancer (age of onset: 76) in her mother; Cancer in her maternal aunt, mother, paternal uncle, and unknown relative; Hypertension in her father and unknown relative; Prostate cancer in her maternal grandfather.      Objective:    Vitals:   06/12/17 0951  BP: (!) 130/104  Pulse: 60    Physical Exam well-developed African-American female in no acute distress, pleasant blood pressure 130/104, pulse 60, height 5 foot 4, weight 255, BMI 43.8. HEENT; nontraumatic normocephalic EOMI PERRLA sclera anicteric, Cardiovascular ;regular rate and rhythm with S1-S2, Pulmonary; clear bilaterally, Abdomen ;soft, nontender nondistended bowel sounds are active there is no palpable mass or hepatosplenomegaly, Rectal; exam, no external lesions noted, digital exam negative no thrombosed hemorrhoid or other lesion palpated stool currently heme-negative she is tender on exam, no fissure appreciated, Extremities; no clubbing cyanosis or edema skin warm and dry, Neuropsych ;mood and affect appropriate       Assessment & Plan:   #42 62 year old African-American female with recent isolated episode of rectal bleeding, and intermittent sharp rectal pain 1 month. Rectal exam negative today no lesion palpated, I cannot appreciate a fissure and she has not been having pain with bowel movements. Rule out occult rectal lesion, rule out symptoms secondary to internal hemorrhoids. #2 sleep apnea, no oxygen use #3 obesity #4 GERD stable  Plan; Patient was given a prescription for Anusol HC suppositories to use at bedtime over the next 2 weeks, hopefully this will help her symptoms. She will be scheduled for colonoscopy with Dr.  Silverio [name]. Procedure was discussed in detail with patient including indications, risks and benefits and she is agreeable to proceed.  Sabrina S Esterwood PA-C 06/12/2017   Cc: Venia Carbon, MD

## 2017-06-14 ENCOUNTER — Telehealth: Payer: Self-pay

## 2017-06-14 DIAGNOSIS — G4733 Obstructive sleep apnea (adult) (pediatric): Secondary | ICD-10-CM | POA: Diagnosis not present

## 2017-06-14 NOTE — Telephone Encounter (Signed)
Incoming fax from [employer] is not covered with insurance. Please give an alternative.

## 2017-06-17 ENCOUNTER — Other Ambulatory Visit: Payer: Self-pay | Admitting: *Deleted

## 2017-06-17 MED ORDER — HYDROCORTISONE ACE-PRAMOXINE 2.5-1 % RE CREA: 1.0000 "application " | TOPICAL_CREAM | Freq: Three times a day (TID) | RECTAL | 0 refills | Status: DC

## 2017-06-17 NOTE — Telephone Encounter (Signed)
Can sent hydrocortisone 1% supp  qhs  X 7 days then prn as needed- I box /3 refills - if that is not covered then Prep H supp OTC same directions will be fine

## 2017-06-17 NOTE — Telephone Encounter (Signed)
LM for patient on [phone]. Advised since the Anusol HC cream was too expensive, we sent Hydrocortisone/ Pramoxine cream 2.5 % . Asking the patient to please call me to advise if she was able to get it.

## 2017-07-02 ENCOUNTER — Other Ambulatory Visit: Payer: Self-pay

## 2017-07-02 ENCOUNTER — Encounter: Payer: Self-pay | Admitting: Gastroenterology

## 2017-07-02 ENCOUNTER — Ambulatory Visit (AMBULATORY_SURGERY_CENTER): Payer: BLUE CROSS/BLUE SHIELD | Admitting: Gastroenterology

## 2017-07-02 VITALS — BP 132/76 | HR 70 | Temp 98.4°F | Resp 11 | Ht 64.0 in | Wt 255.0 lb

## 2017-07-02 DIAGNOSIS — K625 Hemorrhage of anus and rectum: Secondary | ICD-10-CM | POA: Diagnosis present

## 2017-07-02 DIAGNOSIS — D124 Benign neoplasm of descending colon: Secondary | ICD-10-CM

## 2017-07-02 MED ORDER — SODIUM CHLORIDE 0.9 % IV SOLN: 500.0000 mL | Freq: Once | INTRAVENOUS | Status: DC

## 2017-07-02 NOTE — Patient Instructions (Signed)
YOU HAD AN ENDOSCOPIC PROCEDURE TODAY AT THE [facility]:   Refer to the procedure report that was given to you for any specific questions about what was found during the examination.  If the procedure report does not answer your questions, please call your gastroenterologist to clarify.  If you requested that your care partner not be given the details of your procedure findings, then the procedure report has been included in a sealed envelope for you to review at your convenience later.  YOU SHOULD EXPECT: Some feelings of bloating in the abdomen. Passage of more gas than usual.  Walking can help get rid of the air that was put into your GI tract during the procedure and reduce the bloating. If you had a lower endoscopy (such as a colonoscopy or flexible sigmoidoscopy) you may notice spotting of blood in your stool or on the toilet paper. If you underwent a bowel prep for your procedure, you may not have a normal bowel movement for a few days.  Please Note:  You might notice some irritation and congestion in your nose or some drainage.  This is from the oxygen used during your procedure.  There is no need for concern and it should clear up in a day or so.  SYMPTOMS TO REPORT IMMEDIATELY:   Following lower endoscopy (colonoscopy or flexible sigmoidoscopy):  Excessive amounts of blood in the stool  Significant tenderness or worsening of abdominal pains  Swelling of the abdomen that is new, acute  Fever of 100F or higher   For urgent or emergent issues, a gastroenterologist can be reached at any hour by calling [phone].   DIET:  We do recommend a small meal at first, but then you may proceed to your regular diet.  Drink plenty of fluids but you should avoid alcoholic beverages for 24 hours. Try to increase the fiber in your diet, and drink plenty of water.  ACTIVITY:  You should plan to take it easy for the rest of today and you should NOT DRIVE or use heavy machinery until  tomorrow (because of the sedation medicines used during the test).    FOLLOW UP: Our staff will call the number listed on your records the next business day following your procedure to check on you and address any questions or concerns that you may have regarding the information given to you following your procedure. If we do not reach you, we will leave a message.  However, if you are feeling well and you are not experiencing any problems, there is no need to return our call.  We will assume that you have returned to your regular daily activities without incident.  If any biopsies were taken you will be contacted by phone or by letter within the next 1-3 weeks.  Please call us at [phone] if you have not heard about the biopsies in 3 weeks.    SIGNATURES/CONFIDENTIALITY: You and/or your care partner have signed paperwork which will be entered into your electronic medical record.  These signatures attest to the fact that that the information above on your After Visit Summary has been reviewed and is understood.  Full responsibility of the confidentiality of this discharge information lies with you and/or your care-partner.  Read all handouts given to you by your recovery room nurse. 

## 2017-07-02 NOTE — Progress Notes (Signed)
Called to room to assist during endoscopic procedure.  Patient ID and intended procedure confirmed with present staff. Received instructions for my participation in the procedure from the performing physician.  

## 2017-07-02 NOTE — Progress Notes (Signed)
Pt's states no medical or surgical changes since previsit or office visit. 

## 2017-07-02 NOTE — Progress Notes (Signed)
To PACU, VSS. Report to [email or web address] 

## 2017-07-02 NOTE — Op Note (Signed)
[facility] Patient Name: Sabrina [name] Procedure Date: 07/02/2017 10:43 AM MRN: [id number] Endoscopist: Mauri Pole , MD Age: 62 Referring MD:  Date of Birth: [birth year] Gender: Female Account #: [id number] Procedure:                Colonoscopy Indications:              Evaluation of unexplained GI bleeding Medicines:                Monitored Anesthesia Care Procedure:                Pre-Anesthesia Assessment:                           - Prior to the procedure, a History and Physical                            was performed, and patient medications and                            allergies were reviewed. The patient's tolerance of                            previous anesthesia was also reviewed. The risks                            and benefits of the procedure and the sedation                            options and risks were discussed with the patient.                            All questions were answered, and informed consent                            was obtained. Prior Anticoagulants: The patient has                            taken no previous anticoagulant or antiplatelet                            agents. ASA Grade Assessment: II - A patient with                            mild systemic disease. After reviewing the risks                            and benefits, the patient was deemed in                            satisfactory condition to undergo the procedure.                           After obtaining informed consent, the colonoscope  was passed under direct vision. Throughout the                            procedure, the patient's blood pressure, pulse, and                            oxygen saturations were monitored continuously. The                            Colonoscope was introduced through the anus and                            advanced to the the cecum, identified by                            appendiceal orifice and  ileocecal valve. The                            colonoscopy was performed without difficulty. The                            patient tolerated the procedure well. The quality                            of the bowel preparation was excellent. The                            ileocecal valve, appendiceal orifice, and rectum                            were photographed. Scope In: 11:21:53 AM Scope Out: 11:35:22 AM Scope Withdrawal Time: 0 hours 10 minutes 7 seconds  Total Procedure Duration: 0 hours 13 minutes 29 seconds  Findings:                 The perianal and digital rectal examinations were                            normal.                           A few small-mouthed diverticula were found in the                            sigmoid colon and descending colon.                           A 1 mm polyp was found in the descending colon. The                            polyp was sessile. The polyp was removed with a                            cold biopsy forceps. Resection and retrieval were  complete.                           Non-bleeding internal hemorrhoids were found during                            retroflexion. The hemorrhoids were small. Complications:            No immediate complications. Estimated Blood Loss:     Estimated blood loss was minimal. Impression:               - Mild diverticulosis in the sigmoid colon and in                            the descending colon.                           - One 1 mm polyp in the descending colon, removed                            with a cold biopsy forceps. Resected and retrieved.                           - Non-bleeding internal hemorrhoids. Recommendation:           - Patient has a contact number available for                            emergencies. The signs and symptoms of potential                            delayed complications were discussed with the                            patient. Return to normal  activities tomorrow.                            Written discharge instructions were provided to the                            patient.                           - Resume previous diet.                           - Continue present medications.                           - Await pathology results.                           - Repeat colonoscopy in 5-10 years for surveillance                            based on pathology results. Mauri Pole, MD 07/02/2017 11:50:09 AM This report has been signed electronically.

## 2017-07-03 ENCOUNTER — Telehealth: Payer: Self-pay

## 2017-07-03 NOTE — Telephone Encounter (Signed)
  Follow up Call-     Patient questions:  Do you have a fever, pain , or abdominal swelling? No. Pain Score  0 *  Have you tolerated food without any problems? Yes.    Have you been able to return to your normal activities? Yes.    Do you have any questions about your discharge instructions: Diet   No. Medications  No. Follow up visit  No.  Do you have questions or concerns about your Care? No.  Actions: * If pain score is 4 or above: No action needed, pain <4.

## 2017-07-09 DIAGNOSIS — B009 Herpesviral infection, unspecified: Secondary | ICD-10-CM | POA: Diagnosis not present

## 2017-07-09 DIAGNOSIS — R21 Rash and other nonspecific skin eruption: Secondary | ICD-10-CM | POA: Diagnosis not present

## 2017-07-09 DIAGNOSIS — L280 Lichen simplex chronicus: Secondary | ICD-10-CM | POA: Diagnosis not present

## 2017-07-12 DIAGNOSIS — G4733 Obstructive sleep apnea (adult) (pediatric): Secondary | ICD-10-CM | POA: Diagnosis not present

## 2017-07-14 ENCOUNTER — Encounter: Payer: Self-pay | Admitting: Gastroenterology

## 2017-07-15 DIAGNOSIS — M25561 Pain in right knee: Secondary | ICD-10-CM | POA: Diagnosis not present

## 2017-07-15 DIAGNOSIS — S83241D Other tear of medial meniscus, current injury, right knee, subsequent encounter: Secondary | ICD-10-CM | POA: Diagnosis not present

## 2017-08-05 DIAGNOSIS — M1711 Unilateral primary osteoarthritis, right knee: Secondary | ICD-10-CM | POA: Diagnosis not present

## 2017-08-12 DIAGNOSIS — G4733 Obstructive sleep apnea (adult) (pediatric): Secondary | ICD-10-CM | POA: Diagnosis not present

## 2017-08-14 DIAGNOSIS — M1711 Unilateral primary osteoarthritis, right knee: Secondary | ICD-10-CM | POA: Diagnosis not present

## 2017-08-21 DIAGNOSIS — M1711 Unilateral primary osteoarthritis, right knee: Secondary | ICD-10-CM | POA: Diagnosis not present

## 2017-08-23 DIAGNOSIS — M1711 Unilateral primary osteoarthritis, right knee: Secondary | ICD-10-CM | POA: Diagnosis not present

## 2017-09-04 ENCOUNTER — Ambulatory Visit: Payer: Self-pay | Admitting: *Deleted

## 2017-09-04 NOTE — Telephone Encounter (Signed)
I returned her call.  She is c/o having had diarrhea and vomiting on Sunday and Monday.  She was awakened Sunday during the night with vomiting.  The vomiting and diarrhea stopped on Tuesday.   She was able to eat 2 bites of hamburger and chicken noodle soup and keep it down without further vomiting or diarrhea. She is c/o her waistline and legs being sore.   Her arms were also sore but they are ok now.  She mentioned having chills Monday and Tuesday but not today. See triage notes below.  I encouraged her to get some Gatorade and to drink water today to get rehydrated.   Also to eat light small portions today and see how she does.   I went over the home care advice with her.   She was agreeable to this plan.   I instructed her to call us back if she was not better by tomorrow.   She verbalized understanding and assured me she would call us back if she wasn't feeling better by tomorrow.   Reason for Disposition . MILD vomiting with diarrhea  Answer Assessment - Initial Assessment Questions 1. VOMITING SEVERITY: "How many times have you vomited in the past 24 hours?"     - MILD:  1 - 2 times/day    - MODERATE: 3 - 5 times/day, decreased oral intake without significant weight loss or symptoms of dehydration    - SEVERE: 6 or more times/day, vomits everything or nearly everything, with significant weight loss, symptoms of dehydration      My waistline and legs hurt.  Not having vomiting or diarrhea now.   Vomiting Sunday and Monday.  Tuesday no vomiting.  Ate chicken noodle soup it stayed down.    No diarrhea now.  Diarrhea was same as the vomiting. 2. ONSET: "When did the vomiting begin?"      See above 3. FLUIDS: "What fluids or food have you vomited up today?" "Have you been able to keep any fluids down?"     Drinking water and the chicken needle soup stayed down yesterday.   I don't have an appetite today.   I took 2 bites of hamburger yesterday and it stayed down.   I drank 2 glasses ginger ale  yesterday.  Nothing to eat or drink today at all.    4. ABDOMINAL PAIN: "Are your having any abdominal pain?" If yes : "How bad is it and what does it feel like?" (e.g., crampy, dull, intermittent, constant)      I'm sore in my waistline and in my legs.  I was having cold chills at first of the week when sick..   The soreness that was in my arms has resolved.   No abd pain.    Maybe the muscles in my waist are sore maybe from vomiting.   5. DIARRHEA: "Is there any diarrhea?" If so, ask: "How many times today?"      Not now.  See above.   You can get some Gater Aid.   6. CONTACTS: "Is there anyone else in the family with the same symptoms?"      No    7. CAUSE: "What do you think is causing your vomiting?"     It happened during my sleep and it woke me up the vomiting and diarrhea.  I ate at mother's day at church and I think that is what happened. 8. HYDRATION STATUS: "Any signs of dehydration?" (e.g., dry mouth [not only dry lips],  too weak to stand) "When did you last urinate?"     No 9. OTHER SYMPTOMS: "Do you have any other symptoms?" (e.g., fever, headache, vertigo, vomiting blood or coffee grounds, recent head injury)     No.   Had headache yesterday.    10. PREGNANCY: "Is there any chance you are pregnant?" "When was your last menstrual period?"       Not asked due to age  Protocols used: [facility]

## 2017-09-05 ENCOUNTER — Encounter: Payer: Self-pay | Admitting: Family Medicine

## 2017-09-05 ENCOUNTER — Ambulatory Visit: Payer: BLUE CROSS/BLUE SHIELD | Admitting: Family Medicine

## 2017-09-05 VITALS — BP 136/86 | HR 76 | Temp 98.0°F | Ht 64.0 in | Wt 241.0 lb

## 2017-09-05 DIAGNOSIS — E039 Hypothyroidism, unspecified: Secondary | ICD-10-CM | POA: Diagnosis not present

## 2017-09-05 DIAGNOSIS — M79604 Pain in right leg: Secondary | ICD-10-CM | POA: Diagnosis not present

## 2017-09-05 DIAGNOSIS — R197 Diarrhea, unspecified: Secondary | ICD-10-CM | POA: Diagnosis not present

## 2017-09-05 DIAGNOSIS — M545 Low back pain, unspecified: Secondary | ICD-10-CM

## 2017-09-05 DIAGNOSIS — R112 Nausea with vomiting, unspecified: Secondary | ICD-10-CM | POA: Insufficient documentation

## 2017-09-05 DIAGNOSIS — M79605 Pain in left leg: Secondary | ICD-10-CM

## 2017-09-05 DIAGNOSIS — R109 Unspecified abdominal pain: Secondary | ICD-10-CM | POA: Diagnosis not present

## 2017-09-05 MED ORDER — LEVOTHYROXINE SODIUM 50 MCG PO TABS: 50.0000 ug | ORAL_TABLET | Freq: Every day | ORAL | 0 refills | Status: DC

## 2017-09-05 NOTE — Progress Notes (Signed)
BP 136/86 (BP Location: Left Arm, Patient Position: Sitting, Cuff Size: Large)   Pulse 76   Temp 98 F (36.7 C) (Oral)   Ht 5\' 4"  (1.626 m)   Wt 241 lb (109.3 kg)   SpO2 98%   BMI 41.37 kg/m    CC: GI illness Subjective:    Patient ID: Sabrina [name], female    DOB: Dec 08, 1955, 62 y.o.   MRN: [id number]  HPI: Sabrina [name] is a 62 y.o. female presenting on 09/05/2017 for Leg Pain (Soreness in bilateral LEs. Started 08/31/17. Also, had some vomiting, diarrhea, fever and soreness in UEs. Now only soreness in LEs and waistline and fatigue.)   5d h/o vomiting, diarrhea, chills, body aches. 14 lb weight loss noted. This has largely resolved, however has persistent soreness that starts lower back and travels around waistline and lower extremity soreness. No BM since Monday, but passing some gas. Denies inciting trauma/injury or fall. No fever, abd pain, leg swelling, denies urinary symptoms (urgency, frequency, dysuria, hematuria).   She has tolerated chicken noodle soup and gatoraid last few days.  Out of work this week due to illness.  No sick contacts at home. No recent new restaurants. She did eat potato salad at church bbq then chicken salad at friend's house and wonders if symptoms coming form this.  Tried aleve this morning for leg ache - mild improvement.  Takes lasix 80mg  PRN for leg swelling - rare use - has not used recently.  PRN omeprazole 20mg  for GERD.  Has not been taking levothyroxine - for several months.   Recent colonoscopy - polyps rec rpt 5 yrs  Relevant past medical, surgical, family and social history reviewed and updated as indicated. Interim medical history since our last visit reviewed. Allergies and medications reviewed and updated. Outpatient Medications Prior to Visit  Medication Sig Dispense Refill  . furosemide (LASIX) 80 MG tablet Take 1 tablet (80 mg total) by mouth daily as needed.    . naproxen sodium (ANAPROX) 220 MG tablet Take 1 tablet by mouth 2  (two) times daily as needed.    Marland Kitchen omeprazole (PRILOSEC) 20 MG capsule Take 1 capsule (20 mg total) by mouth daily. (Patient not taking: Reported on 07/02/2017) 90 capsule 3  . benzonatate (TESSALON) 200 MG capsule Take 1 capsule (200 mg total) by mouth 3 (three) times daily as needed for cough. (Patient not taking: Reported on 07/02/2017) 60 capsule 0  . furosemide (LASIX) 80 MG tablet Take 1 tablet (80 mg total) by mouth daily as needed. 1 tablet 0  . hydrocortisone (ANUSOL-HC) 25 MG suppository Place 1 suppository (25 mg total) rectally at bedtime. One every night for 2 weeks. Then at night as needed. (Patient not taking: Reported on 07/02/2017) 30 suppository 2  . hydrocortisone 2.5 % cream Apply topically 3 (three) times daily as needed. 28 g 3  . hydrocortisone-pramoxine (ANALPRAM-HC) 2.5-1 % rectal cream Place 1 application rectally 3 (three) times daily. (Patient not taking: Reported on 07/02/2017) 30 g 0  . levothyroxine (SYNTHROID, LEVOTHROID) 50 MCG tablet Take 1 tablet (50 mcg total) by mouth daily. 90 tablet 3  . sennosides-docusate sodium (SENOKOT-S) 8.6-50 MG tablet Take 2 tablets by mouth daily. (Patient not taking: Reported on 07/02/2017) 30 tablet 1   Facility-Administered Medications Prior to Visit  Medication Dose Route Frequency Provider Last Rate Last Dose  . 0.9 %  sodium chloride infusion  500 mL Intravenous Once Nandigam, Venia Minks, MD  Per HPI unless specifically indicated in ROS section below Review of Systems     Objective:    BP 136/86 (BP Location: Left Arm, Patient Position: Sitting, Cuff Size: Large)   Pulse 76   Temp 98 F (36.7 C) (Oral)   Ht 5\' 4"  (1.626 m)   Wt 241 lb (109.3 kg)   SpO2 98%   BMI 41.37 kg/m   Wt Readings from Last 3 Encounters:  09/05/17 241 lb (109.3 kg)  07/02/17 255 lb (115.7 kg)  06/12/17 255 lb 4 oz (115.8 kg)    Physical Exam  Constitutional: She appears well-developed and well-nourished. No distress.  Tired,  uncomfortable appearing  HENT:  Mouth/Throat: Oropharynx is clear and moist. No oropharyngeal exudate.  Neck: No thyromegaly present.  Cardiovascular: Normal rate, regular rhythm and normal heart sounds.  No murmur heard. Pulmonary/Chest: Effort normal and breath sounds normal. No respiratory distress. She has no wheezes. She has no rales.  Abdominal: Soft. Bowel sounds are normal. She exhibits no distension and no mass. There is no tenderness. There is no rebound and no guarding. No hernia.  Musculoskeletal: She exhibits edema (chronic nonpitting - improved per patient).  Marked tenderness to palpation throughout bilateral lower legs - from knees down 2+ DP bilaterally Some thickening of LE skin as well as chronic ankle edema  Skin: Skin is warm.  Psychiatric: She has a normal mood and affect.  Nursing note and vitals reviewed.     Assessment & Plan:   Problem List Items Addressed This Visit    Hypothyroidism    Has not been taking thyroid - anticipate developing pretibial myxedema - may be contributing to marked leg pain. Advised restart levothyroxine 55mcg daily. Check TSH, free T4 today.       Relevant Medications   levothyroxine (SYNTHROID, LEVOTHROID) 50 MCG tablet   Other Relevant Orders   TSH   T4, free   Nausea vomiting and diarrhea - Primary    Initial episode that is resolving, however with persistent back discomfort, and marked new leg pain/soreness. ?dehydration related vs myalgias vs electrolyte imbalance - check labwork today to further evaluate. She endorses good hydration status. Reviewed bland diet and slowly advancing as tolerated. Will write out of work for rest of week.       Relevant Orders   Comprehensive metabolic panel   CBC with Differential/Platelet    Other Visit Diagnoses    Lumbar back pain       Pain in both lower extremities       Relevant Orders   Sedimentation rate   CK       Meds ordered this encounter  Medications  . levothyroxine  (SYNTHROID, LEVOTHROID) 50 MCG tablet    Sig: Take 1 tablet (50 mcg total) by mouth daily.    Dispense:  90 tablet    Refill:  0   Orders Placed This Encounter  Procedures  . Comprehensive metabolic panel  . TSH  . CBC with Differential/Platelet  . T4, free  . Sedimentation rate  . CK    Follow up plan: Return if symptoms worsen or fail to improve.  Ria Bush, MD

## 2017-09-05 NOTE — Assessment & Plan Note (Signed)
Initial episode that is resolving, however with persistent back discomfort, and marked new leg pain/soreness. ?dehydration related vs myalgias vs electrolyte imbalance - check labwork today to further evaluate. She endorses good hydration status. Reviewed bland diet and slowly advancing as tolerated. Will write out of work for rest of week.

## 2017-09-05 NOTE — Assessment & Plan Note (Signed)
Has not been taking thyroid - anticipate developing pretibial myxedema - may be contributing to marked leg pain. Advised restart levothyroxine 36mcg daily. Check TSH, free T4 today.

## 2017-09-05 NOTE — Patient Instructions (Addendum)
Continue pushing fluids, bland diet for next 1-2 days.  Labs today for further evalution of leg pains and recent GI illness.  Rest legs, elevate legs. We will be in touch with results.  Restart levothyroxine 51mcg daily.

## 2017-09-10 LAB — CBC WITH DIFFERENTIAL/PLATELET
BASOS: 0 %
Basophils Absolute: 0 10*3/uL (ref 0.0–0.2)
EOS (ABSOLUTE): 0.1 10*3/uL (ref 0.0–0.4)
Eos: 1 %
Hematocrit: 43.3 % (ref 34.0–46.6)
Hemoglobin: 14.6 g/dL (ref 11.1–15.9)
Immature Grans (Abs): 0 10*3/uL (ref 0.0–0.1)
Immature Granulocytes: 0 %
Lymphocytes Absolute: 3.2 10*3/uL — ABNORMAL HIGH (ref 0.7–3.1)
Lymphs: 30 %
MCH: 30.6 pg (ref 26.6–33.0)
MCHC: 33.7 g/dL (ref 31.5–35.7)
MCV: 91 fL (ref 79–97)
MONOS ABS: 0.8 10*3/uL (ref 0.1–0.9)
Monocytes: 7 %
NEUTROS ABS: 6.5 10*3/uL (ref 1.4–7.0)
Neutrophils: 62 %
Platelets: 318 10*3/uL (ref 150–379)
RBC: 4.77 x10E6/uL (ref 3.77–5.28)
RDW: 13.5 % (ref 12.3–15.4)
WBC: 10.6 10*3/uL (ref 3.4–10.8)

## 2017-09-10 LAB — T4, FREE: FREE T4: 1.3 ng/dL (ref 0.82–1.77)

## 2017-09-10 LAB — COMPREHENSIVE METABOLIC PANEL
ALK PHOS: 77 IU/L (ref 39–117)
ALT: 23 IU/L (ref 0–32)
AST: 24 IU/L (ref 0–40)
Albumin/Globulin Ratio: 1.1 — ABNORMAL LOW (ref 1.2–2.2)
Albumin: 4.1 g/dL (ref 3.5–5.5)
BILIRUBIN TOTAL: 0.4 mg/dL (ref 0.0–1.2)
BUN / CREAT RATIO: 9 (ref 9–23)
BUN: 8 mg/dL (ref 6–24)
CHLORIDE: 100 mmol/L (ref 96–106)
CO2: 21 mmol/L (ref 20–29)
Calcium: 9.1 mg/dL (ref 8.7–10.2)
Creatinine, Ser: 0.88 mg/dL (ref 0.57–1.00)
GFR calc non Af Amer: 72 mL/min/{1.73_m2} (ref >59)
GFR, EST AFRICAN AMERICAN: 83 mL/min/{1.73_m2} (ref >59)
GLOBULIN, TOTAL: 3.6 g/dL (ref 1.5–4.5)
GLUCOSE: 105 mg/dL — AB (ref 65–99)
Potassium: 3.8 mmol/L (ref 3.5–5.2)
SODIUM: 140 mmol/L (ref 134–144)
TOTAL PROTEIN: 7.7 g/dL (ref 6.0–8.5)

## 2017-09-10 LAB — TSH: TSH: 3.45 u[IU]/mL (ref 0.450–4.500)

## 2017-09-10 LAB — CK: CK TOTAL: 54 U/L (ref 24–173)

## 2017-09-10 LAB — SEDIMENTATION RATE: Sed Rate: 37 mm/hr (ref 0–40)

## 2017-09-11 DIAGNOSIS — G4733 Obstructive sleep apnea (adult) (pediatric): Secondary | ICD-10-CM | POA: Diagnosis not present

## 2017-09-12 ENCOUNTER — Telehealth: Payer: Self-pay | Admitting: Gastroenterology

## 2017-09-12 NOTE — Telephone Encounter (Signed)
Left message to call back  

## 2017-10-04 ENCOUNTER — Encounter: Payer: 59 | Admitting: Internal Medicine

## 2017-10-09 ENCOUNTER — Encounter: Payer: Self-pay | Admitting: Internal Medicine

## 2017-10-09 ENCOUNTER — Ambulatory Visit (INDEPENDENT_AMBULATORY_CARE_PROVIDER_SITE_OTHER): Payer: BLUE CROSS/BLUE SHIELD | Admitting: Internal Medicine

## 2017-10-09 DIAGNOSIS — G4733 Obstructive sleep apnea (adult) (pediatric): Secondary | ICD-10-CM | POA: Diagnosis not present

## 2017-10-09 DIAGNOSIS — Z0000 Encounter for general adult medical examination without abnormal findings: Secondary | ICD-10-CM

## 2017-10-09 DIAGNOSIS — I872 Venous insufficiency (chronic) (peripheral): Secondary | ICD-10-CM | POA: Diagnosis not present

## 2017-10-09 DIAGNOSIS — K2100 Gastro-esophageal reflux disease with esophagitis, without bleeding: Secondary | ICD-10-CM

## 2017-10-09 DIAGNOSIS — M170 Bilateral primary osteoarthritis of knee: Secondary | ICD-10-CM | POA: Diagnosis not present

## 2017-10-09 MED ORDER — FUROSEMIDE 80 MG PO TABS: 80.0000 mg | ORAL_TABLET | Freq: Every day | ORAL | 11 refills | Status: DC | PRN

## 2017-10-09 MED ORDER — OMEPRAZOLE 20 MG PO CPDR: 20.0000 mg | DELAYED_RELEASE_CAPSULE | Freq: Every day | ORAL | 3 refills | Status: DC

## 2017-10-09 NOTE — Assessment & Plan Note (Signed)
Probably needs right TKR as soon as possible Would be medically cleared at this point if EKG is okay

## 2017-10-09 NOTE — Assessment & Plan Note (Signed)
Quiet on the PPI 

## 2017-10-09 NOTE — Patient Instructions (Addendum)
Please try an over the counter potassium pill to go along with the fluid pill you are taking daily.  Cooking With Less Salt Cooking with less salt is one way to reduce the amount of sodium you get from food. Depending on your condition and overall health, your health care provider or diet and nutrition specialist (dietitian) may recommend that you reduce your sodium intake. Most people should have less than 2,300 milligrams (mg) of sodium each day. If you have high blood pressure (hypertension), you may need to limit your sodium to 1,500 mg each day. Follow the tips below to help reduce your sodium intake. What do I need to know about cooking with less salt? Shopping  Buy sodium-free or low-sodium products. Look for the following words on food labels: ? Low-sodium. ? Sodium-free. ? Reduced-sodium. ? No salt added. ? Unsalted.  Buy fresh or frozen vegetables. Avoid canned vegetables.  Avoid buying meats or protein foods that have been injected with broth or saline solution.  Avoid cured or smoked meats, such as hot dogs, bacon, salami, ham, and bologna. Reading food labels  Check the food label before buying or using packaged ingredients.  Look for products with no more than 140 mg of sodium in one serving.  Do not choose foods with salt as one of the first three ingredients on the ingredients list. If salt is one of the first three ingredients, it usually means the item is high in sodium, because ingredients are listed in order of amount in the food item. Cooking  Use herbs, seasonings without salt, and spices as substitutes for salt in foods.  Use sodium-free baking soda when baking.  Grill, braise, or roast foods to add flavor with less salt.  Avoid adding salt to pasta, rice, or hot cereals while cooking.  Drain and rinse canned vegetables before use.  Avoid adding salt when cooking sweets and desserts.  Cook with low-sodium ingredients. What are some salt  alternatives? The following are herbs, seasonings, and spices that can be used instead of salt to give taste to your food. Herbs should be fresh or dried. Do not choose packaged mixes. Next to the name of the herb, spice, or seasoning are some examples of foods you can pair it with. Herbs  Bay leaves - Soups, meat and vegetable dishes, and spaghetti sauce.  Basil - [employer], soups, pasta, and fish dishes.  Cilantro - Meat, poultry, and vegetable dishes.  Chili powder - Marinades and Mexican dishes.  Chives - Salad dressings and potato dishes.  Cumin - Mexican dishes, couscous, and meat dishes.  Dill - Fish dishes, sauces, and salads.  Fennel - Meat and vegetable dishes, breads, and cookies.  Garlic (do not use garlic salt) - [place] dishes, meat dishes, salad dressings, and sauces.  Marjoram - Soups, potato dishes, and meat dishes.  Oregano - Pizza and spaghetti sauce.  Parsley - Salads, soups, pasta, and meat dishes.  Rosemary - [place] dishes, salad dressings, soups, and red meats.  Saffron - Fish dishes, pasta, and some poultry dishes.  Sage - Stuffings and sauces.  Tarragon - Fish and [employer].  Thyme - Stuffing, meat, and fish dishes. Seasonings  Lemon juice - Fish dishes, poultry dishes, vegetables, and salads.  Vinegar - Salad dressings, vegetables, and fish dishes. Spices  Cinnamon - Sweet dishes, such as cakes, cookies, and puddings.  Cloves - Gingerbread, puddings, and marinades for meats.  Curry - Vegetable dishes, fish and poultry dishes, and stir-fry dishes.  Ginger - Vegetables dishes, fish dishes, and stir-fry dishes.  Nutmeg - Pasta, vegetables, poultry, fish dishes, and custard. What are some low-sodium ingredients and foods?  Fresh or frozen fruits and vegetables with no sauce added.  Fresh or frozen whole meats, poultry, and fish with no sauce added.  Eggs.  Noodles, pasta, quinoa, rice.  Shredded or puffed wheat or puffed  rice.  Regular or quick oats.  Milk, yogurt, hard cheeses, and low-sodium cheeses. Good cheese choices include Swiss, [place]. Always check the label for the serving size and sodium content.  Unsalted butter or margarine.  Unsalted nuts.  Sherbet or ice cream (keep to  cup per serving).  Homemade pudding.  Sodium-free baking soda and baking powder. This is not a complete list of low-sodium ingredients and foods. Contact your dietitian for more options. Summary  Cooking with less salt is one way to reduce the amount of sodium that you get from food.  Buy sodium-free or low-sodium products.  Check the food label before using or buying packaged ingredients.  Use herbs, seasonings without salt, and spices as substitutes for salt in foods. This information is not intended to replace advice given to you by your health care provider. Make sure you discuss any questions you have with your health care provider. Document Released: 04/09/2005 Document Revised: 04/17/2016 Document Reviewed: 04/17/2016 Elsevier Interactive Patient Education  2017 [employer].

## 2017-10-09 NOTE — Assessment & Plan Note (Signed)
May have element of lymphedema as well---but finds the furosemide helps Add OTC potassium

## 2017-10-09 NOTE — Assessment & Plan Note (Signed)
Uses CPAP with good effect every night

## 2017-10-09 NOTE — Assessment & Plan Note (Signed)
Healthy but badly out of shape Unable to do much due to bad knees--discussed Recommended flu vaccine--she doesn't want Colon due 2023 Mammogram due 9/20 Doesn't need pap

## 2017-10-09 NOTE — Progress Notes (Signed)
Subjective:    Patient ID: Sabrina [name], female    DOB: Nov 13, 1955, 62 y.o.   MRN: [id number]  HPI Here for physical Feels better since the illness last month Some tired feelings--- is back on the thyroid medication regularly again Tries to walk in building on break--no other exercise  Still limited by pain in knee Not better after arthroscopy  Is going back --no help with viscosupplementation  Still retaining fluid--in legs Doesn't use as much salt now--discussed not using it at all  Colonoscopy just showed 1 small tubular adenoma Has had some spells of slight bleeding on toilet paper Brief sharp pain--better after deep breaths or hot compress on rectum This fatigues her  Still takes PPI daily No dysphagia  Current Outpatient Medications on File Prior to Visit  Medication Sig Dispense Refill  . furosemide (LASIX) 80 MG tablet Take 1 tablet (80 mg total) by mouth daily as needed.    Marland Kitchen levothyroxine (SYNTHROID, LEVOTHROID) 50 MCG tablet Take 1 tablet (50 mcg total) by mouth daily. 90 tablet 0  . naproxen sodium (ANAPROX) 220 MG tablet Take 1 tablet by mouth 2 (two) times daily as needed.    Marland Kitchen omeprazole (PRILOSEC) 20 MG capsule Take 1 capsule (20 mg total) by mouth daily. 90 capsule 3   No current facility-administered medications on file prior to visit.     No Known Allergies  Past Medical History:  Diagnosis Date  . Allergic rhinitis   . Arthritis    Right knee, right elbow  . Complex tear of medial meniscus of right knee 06/26/2016  . GERD (gastroesophageal reflux disease)   . Hypertension   . Hypothyroidism 2008  . Obstructive sleep apnea    CPAP   --   NOT ABLE TO USE   . Viral meningitis 5/08   [facility]    Past Surgical History:  Procedure Laterality Date  . ABDOMINAL HYSTERECTOMY  1982   Dr Vernie Ammons assisted by Dr Bary Castilla  . APPENDECTOMY     teenager  . BREAST BIOPSY Left 03/09/2015   benign/ Dr. Bary Castilla done in office NEG  . BREAST EXCISIONAL BIOPSY  Left 1999   [facility]  . CHONDROPLASTY Right 06/26/2016   Procedure: CHONDROPLASTY;  Surgeon: Marchia Bond, MD;  Location: [place];  Service: Orthopedics;  Laterality: Right;  . KNEE ARTHROSCOPY W/ LASER  8/07   Left  . KNEE ARTHROSCOPY WITH MEDIAL MENISECTOMY Right 06/26/2016   Procedure: RIGHT KNEE ARTHROSCOPY WITH MEDIAL MENISCECTOMY AND CHONDROPLASTY;  Surgeon: Marchia Bond, MD;  Location: [place];  Service: Orthopedics;  Laterality: Right;  . TONSILLECTOMY     as child  . VESICOVAGINAL FISTULA CLOSURE W/ TAH  1987   Fibroids    Family History  Problem Relation Age of Onset  . Cancer Mother        mastectomy for breast cancer  . Breast cancer Mother 46  . Hypertension Father   . Cancer Paternal Uncle        pancreatic cancer  . Hypertension Unknown        both sides  . Cancer Unknown        maternal side  . Prostate cancer Maternal Grandfather   . Breast cancer Maternal Aunt 42  . Cancer Maternal Aunt        breast cancer  . Breast cancer Maternal Aunt 60  . Coronary artery disease Neg Hx   . Diabetes Neg Hx   . Colon cancer  Neg Hx   . Liver cancer Neg Hx     Social History   Socioeconomic History  . Marital status: Married    Spouse name: Not on file  . Number of children: 0  . Years of education: Not on file  . Highest education level: Not on file  Occupational History  . Occupation: [employer] in [employer].     Employer: [employer]  . Occupation: Caregiver --part time    Comment:  independent now  Social Needs  . Financial resource strain: Not on file  . Food insecurity:    Worry: Not on file    Inability: Not on file  . Transportation needs:    Medical: Not on file    Non-medical: Not on file  Tobacco Use  . Smoking status: Never Smoker  . Smokeless tobacco: Never Used  Substance and Sexual Activity  . Alcohol use: Yes    Comment: beer on weekends- occasional  . Drug use: No  . Sexual activity: Yes     Birth control/protection: None  Lifestyle  . Physical activity:    Days per week: Not on file    Minutes per session: Not on file  . Stress: Not on file  Relationships  . Social connections:    Talks on phone: Not on file    Gets together: Not on file    Attends religious service: Not on file    Active member of club or organization: Not on file    Attends meetings of clubs or organizations: Not on file    Relationship status: Not on file  . Intimate partner violence:    Fear of current or ex partner: Not on file    Emotionally abused: Not on file    Physically abused: Not on file    Forced sexual activity: Not on file  Other Topics Concern  . Not on file  Social History Narrative   From [place].   Moved to [place] in high school then back here.     Review of Systems  Constitutional: Positive for fatigue. Negative for unexpected weight change.       Wears seat belt   HENT: Negative for dental problem, hearing loss and trouble swallowing.        Keeps up with dentist  Eyes: Negative for visual disturbance.       No diplopia or unkilateral vision loss  Respiratory: Negative for cough, chest tightness and shortness of breath.   Cardiovascular: Positive for leg swelling. Negative for chest pain and palpitations.  Gastrointestinal: Positive for anal bleeding. Negative for abdominal pain.  Endocrine: Negative for polydipsia and polyuria.  Genitourinary: Positive for frequency and urgency.       Has to rush after taking furosemide  Musculoskeletal: Negative for back pain.       Knee pain so bad she has to sleep in recliner  Skin: Negative for rash.       Rash better with Rx from the dermatologist  Allergic/Immunologic: Negative for environmental allergies and immunocompromised state.  Neurological: Negative for dizziness, syncope, light-headedness and headaches.  Hematological: Negative for adenopathy. Bruises/bleeds easily.  Psychiatric/Behavioral: Negative for dysphoric mood  and sleep disturbance. The patient is not nervous/anxious.        Objective:   Physical Exam  Constitutional: She is oriented to person, place, and time. She appears well-developed. No distress.  HENT:  Head: Normocephalic and atraumatic.  Right Ear: External ear normal.  Left Ear: External ear normal.  Mouth/Throat: Oropharynx is clear and moist. No oropharyngeal exudate.  Eyes: Pupils are equal, round, and reactive to light. Conjunctivae are normal.  Neck: No thyromegaly present.  Cardiovascular: Normal rate, regular rhythm, normal heart sounds and intact distal pulses. Exam reveals no gallop.  No murmur heard. Respiratory: Effort normal and breath sounds normal. No respiratory distress. She has no wheezes. She has no rales.  GI: Soft. There is no tenderness.  Musculoskeletal:  3+ non pitting edema in both calves--not really in feet  Lymphadenopathy:    She has no cervical adenopathy.  Neurological: She is alert and oriented to person, place, and time.  Skin: No rash noted. No erythema.  Psychiatric: She has a normal mood and affect. Her behavior is normal.           Assessment & Plan:

## 2017-10-12 DIAGNOSIS — G4733 Obstructive sleep apnea (adult) (pediatric): Secondary | ICD-10-CM | POA: Diagnosis not present

## 2017-11-11 DIAGNOSIS — G4733 Obstructive sleep apnea (adult) (pediatric): Secondary | ICD-10-CM | POA: Diagnosis not present

## 2017-11-14 DIAGNOSIS — G4733 Obstructive sleep apnea (adult) (pediatric): Secondary | ICD-10-CM | POA: Diagnosis not present

## 2017-11-15 DIAGNOSIS — M1711 Unilateral primary osteoarthritis, right knee: Secondary | ICD-10-CM | POA: Diagnosis not present

## 2017-11-18 DIAGNOSIS — M1711 Unilateral primary osteoarthritis, right knee: Secondary | ICD-10-CM | POA: Diagnosis not present

## 2017-11-19 DIAGNOSIS — I890 Lymphedema, not elsewhere classified: Secondary | ICD-10-CM | POA: Diagnosis not present

## 2017-11-19 DIAGNOSIS — M722 Plantar fascial fibromatosis: Secondary | ICD-10-CM | POA: Diagnosis not present

## 2017-11-19 DIAGNOSIS — M79672 Pain in left foot: Secondary | ICD-10-CM | POA: Diagnosis not present

## 2017-11-19 DIAGNOSIS — M7732 Calcaneal spur, left foot: Secondary | ICD-10-CM | POA: Diagnosis not present

## 2017-12-12 DIAGNOSIS — G4733 Obstructive sleep apnea (adult) (pediatric): Secondary | ICD-10-CM | POA: Diagnosis not present

## 2017-12-20 ENCOUNTER — Telehealth: Payer: Self-pay | Admitting: Internal Medicine

## 2017-12-20 ENCOUNTER — Other Ambulatory Visit (HOSPITAL_COMMUNITY)
Admission: RE | Admit: 2017-12-20 | Discharge: 2017-12-20 | Disposition: A | Discharge status: Home / Self Care | Payer: BLUE CROSS/BLUE SHIELD | Source: Ambulatory Visit | Attending: Obstetrics & Gynecology | Admitting: Obstetrics & Gynecology

## 2017-12-20 ENCOUNTER — Ambulatory Visit (INDEPENDENT_AMBULATORY_CARE_PROVIDER_SITE_OTHER): Payer: BLUE CROSS/BLUE SHIELD | Admitting: Obstetrics & Gynecology

## 2017-12-20 ENCOUNTER — Encounter: Payer: Self-pay | Admitting: Obstetrics & Gynecology

## 2017-12-20 VITALS — BP 120/80 | Ht 64.0 in | Wt 250.0 lb

## 2017-12-20 DIAGNOSIS — Z1239 Encounter for other screening for malignant neoplasm of breast: Secondary | ICD-10-CM

## 2017-12-20 DIAGNOSIS — Z01419 Encounter for gynecological examination (general) (routine) without abnormal findings: Secondary | ICD-10-CM | POA: Diagnosis not present

## 2017-12-20 DIAGNOSIS — Z87410 Personal history of cervical dysplasia: Secondary | ICD-10-CM | POA: Diagnosis not present

## 2017-12-20 DIAGNOSIS — Z0000 Encounter for general adult medical examination without abnormal findings: Secondary | ICD-10-CM

## 2017-12-20 DIAGNOSIS — Z90710 Acquired absence of both cervix and uterus: Secondary | ICD-10-CM | POA: Insufficient documentation

## 2017-12-20 DIAGNOSIS — Z1231 Encounter for screening mammogram for malignant neoplasm of breast: Secondary | ICD-10-CM | POA: Diagnosis not present

## 2017-12-20 NOTE — Telephone Encounter (Signed)
Please advise about the place inside her lip.  Should she be seen sooner than 01/01/18? She'll only see Dr.Letvak.

## 2017-12-20 NOTE — Patient Instructions (Signed)
PAP every year Mammogram every year    Call [phone] to schedule at [facility] Colonoscopy every 5 years Labs yearly (with PCP)

## 2017-12-20 NOTE — Telephone Encounter (Signed)
Form done No charge 

## 2017-12-20 NOTE — Telephone Encounter (Signed)
Patient dropped off biometric form to be filled out.  When form is completed, please call patient for pick up.  Patient has a place inside her lip. She's had it for a couple of weeks, but it's getting larger.  Next available appointment was 01/01/18.  Do you want to see her sooner?

## 2017-12-20 NOTE — Telephone Encounter (Signed)
Spoke to pt to let her know the form is up front. She se she thought she would be ok to wait until the 11th to be seen, and knows it will be next week before I get back with her.

## 2017-12-20 NOTE — Telephone Encounter (Signed)
Form is for [employer] [facility] due to elevated BMI. Form placed in Dr Alla German Inbox on his desk

## 2017-12-20 NOTE — Progress Notes (Signed)
HPI:      [name] is a 62 y.o. G0P0000 who LMP was in the past (prior hysterectomy), she presents today for her annual examination.  The patient has no complaints today other than continued OAB without LOU. The patient is sexually active. Herlast pap: approximate date 2018 and was normal and  h/o abn vag PAP w LGSIL < 5 years ago  and last mammogram: approximate date 2018 and was normal.  The patient does perform self breast exams.  There is notable family history of breast or ovarian cancer in her family. The patient is not taking hormone replacement therapy. Patient denies post-menopausal vaginal bleeding.   The patient has regular exercise: yes. The patient denies current symptoms of depression.    GYN Hx: Last Colonoscopy:few mos ago. Normal. Hemorrhoids.  5 year plan Last DEXA: never ago.    PMHx: Past Medical History:  Diagnosis Date  . Allergic rhinitis   . Arthritis    Right knee, right elbow  . Complex tear of medial meniscus of right knee 06/26/2016  . GERD (gastroesophageal reflux disease)   . Hypertension   . Hypothyroidism 2008  . Obstructive sleep apnea    CPAP   --   NOT ABLE TO USE   . Viral meningitis 5/08   [facility]   Past Surgical History:  Procedure Laterality Date  . ABDOMINAL HYSTERECTOMY  1982   Dr Vernie Ammons assisted by Dr Bary Castilla  . APPENDECTOMY     teenager  . BREAST BIOPSY Left 03/09/2015   benign/ Dr. Bary Castilla done in office NEG  . BREAST EXCISIONAL BIOPSY Left 1999   [facility] NEG  . CHONDROPLASTY Right 06/26/2016   Procedure: CHONDROPLASTY;  Surgeon: Marchia Bond, MD;  Location: [place];  Service: Orthopedics;  Laterality: Right;  . KNEE ARTHROSCOPY W/ LASER  8/07   Left  . KNEE ARTHROSCOPY WITH MEDIAL MENISECTOMY Right 06/26/2016   Procedure: RIGHT KNEE ARTHROSCOPY WITH MEDIAL MENISCECTOMY AND CHONDROPLASTY;  Surgeon: Marchia Bond, MD;  Location: [place];  Service: Orthopedics;  Laterality: Right;  .  TONSILLECTOMY     as child  . VESICOVAGINAL FISTULA CLOSURE W/ TAH  1987   Fibroids   Family History  Problem Relation Age of Onset  . Cancer Mother        mastectomy for breast cancer  . Breast cancer Mother 11  . Hypertension Father   . Cancer Paternal Uncle        pancreatic cancer  . Hypertension Unknown        both sides  . Cancer Unknown        maternal side  . Prostate cancer Maternal Grandfather   . Breast cancer Maternal Aunt 52  . Cancer Maternal Aunt        breast cancer  . Breast cancer Maternal Aunt 60  . Coronary artery disease Neg Hx   . Diabetes Neg Hx   . Colon cancer Neg Hx   . Liver cancer Neg Hx    Social History   Tobacco Use  . Smoking status: Never Smoker  . Smokeless tobacco: Never Used  Substance Use Topics  . Alcohol use: Yes    Comment: beer on weekends- occasional  . Drug use: No    Current Outpatient Medications:  .  furosemide (LASIX) 80 MG tablet, Take 1 tablet (80 mg total) by mouth daily as needed., Disp: 30 tablet, Rfl: 11 .  levothyroxine (SYNTHROID, LEVOTHROID) 50 MCG tablet, Take 1  tablet (50 mcg total) by mouth daily., Disp: 90 tablet, Rfl: 0 .  naproxen sodium (ANAPROX) 220 MG tablet, Take 1 tablet by mouth 2 (two) times daily as needed., Disp: , Rfl:  .  omeprazole (PRILOSEC) 20 MG capsule, Take 1 capsule (20 mg total) by mouth daily., Disp: 90 capsule, Rfl: 3 Allergies: Patient has no known allergies.  Review of Systems  Constitutional: Negative for chills, fever and malaise/fatigue.  HENT: Negative for congestion, sinus pain and sore throat.   Eyes: Negative for blurred vision and pain.  Respiratory: Negative for cough and wheezing.   Cardiovascular: Negative for chest pain and leg swelling.  Gastrointestinal: Negative for abdominal pain, constipation, diarrhea, heartburn, nausea and vomiting.  Genitourinary: Negative for dysuria, frequency, hematuria and urgency.  Musculoskeletal: Positive for joint pain. Negative for  back pain, myalgias and neck pain.  Skin: Negative for itching and rash.  Neurological: Negative for dizziness, tremors and weakness.  Endo/Heme/Allergies: Does not bruise/bleed easily.  Psychiatric/Behavioral: Negative for depression. The patient is not nervous/anxious and does not have insomnia.    Objective: BP 120/80   Ht 5\' 4"  (1.626 m)   Wt 250 lb (113.4 kg)   BMI 42.91 kg/m   Filed Weights   12/20/17 0909  Weight: 250 lb (113.4 kg)   Body mass index is 42.91 kg/m. Physical Exam  Constitutional: She is oriented to person, place, and time. She appears well-developed and well-nourished. No distress.  Genitourinary: Rectum normal and vagina normal. Pelvic exam was performed with patient supine. There is no rash or lesion on the right labia. There is no rash or lesion on the left labia. Vagina exhibits no lesion. No bleeding in the vagina. Right adnexum does not display mass and does not display tenderness. Left adnexum does not display mass and does not display tenderness.  Genitourinary Comments: Absent Uterus Absent cervix Vaginal cuff well healed  HENT:  Head: Normocephalic and atraumatic. Head is without laceration.  Right Ear: Hearing normal.  Left Ear: Hearing normal.  Nose: No epistaxis.  No foreign bodies.  Mouth/Throat: Uvula is midline, oropharynx is clear and moist and mucous membranes are normal.  Eyes: Pupils are equal, round, and reactive to light.  Neck: Normal range of motion. Neck supple. No thyromegaly present.  Cardiovascular: Normal rate and regular rhythm. Exam reveals no gallop and no friction rub.  No murmur heard. Pulmonary/Chest: Effort normal and breath sounds normal. No respiratory distress. She has no wheezes. Right breast exhibits no mass, no skin change and no tenderness. Left breast exhibits no mass, no skin change and no tenderness.  Abdominal: Soft. Bowel sounds are normal. She exhibits no distension. There is no tenderness. There is no rebound.    Musculoskeletal: Normal range of motion.  Neurological: She is alert and oriented to person, place, and time. No cranial nerve deficit.  Skin: Skin is warm and dry.  Psychiatric: She has a normal mood and affect. Judgment normal.  Vitals reviewed. Limited pelvic exam due to obesity  Assessment: Annual Exam 1. Annual physical exam   2. History of cervical dysplasia   3. Screening for breast cancer    Plan:            1.  Cervical Screening-  Pap smear done today  2. Breast screening- Exam annually and mammogram scheduled  3. Colonoscopy every 10 years, Hemoccult testing after age 52  4. Labs managed by PCP  5. Counseling for hormonal therapy: none  6. Obesity discussed  7. [place]  neg; prefers to have checked yearly    F/U  Return in about 1 year (around 12/21/2018) for Annual.  Barnett Applebaum, MD, [facility], [facility] 12/20/2017  9:22 AM

## 2017-12-21 NOTE — Telephone Encounter (Signed)
Check with her on Tuesday If the place on her lip is worse, we can work her in sooner

## 2017-12-24 ENCOUNTER — Encounter: Payer: Self-pay | Admitting: Obstetrics and Gynecology

## 2017-12-24 LAB — CYTOLOGY - PAP: Diagnosis: NEGATIVE

## 2017-12-24 NOTE — Telephone Encounter (Signed)
Spoke to pt. She said she will wait until next week to be seen.

## 2018-01-01 ENCOUNTER — Ambulatory Visit: Payer: BLUE CROSS/BLUE SHIELD | Admitting: Internal Medicine

## 2018-01-01 ENCOUNTER — Encounter: Payer: Self-pay | Admitting: Internal Medicine

## 2018-01-01 VITALS — BP 124/86 | HR 67 | Temp 97.5°F | Ht 64.0 in | Wt 255.0 lb

## 2018-01-01 DIAGNOSIS — K1370 Unspecified lesions of oral mucosa: Secondary | ICD-10-CM | POA: Insufficient documentation

## 2018-01-01 NOTE — Assessment & Plan Note (Signed)
Likely just a mucous cyst--but it is bothering her Will set up with ENT for further evaluation and to consider if excision warranted

## 2018-01-01 NOTE — Progress Notes (Signed)
Subjective:    Patient ID: Sabrina [name], female    DOB: 09-02-1955, 62 y.o.   MRN: [id number]  HPI Here due to lip lesion Has bump that started 1 month ago or so Noticed after coming back from vacation Kept feeling it with her tongue  No pain No discharge and hasn't broken No treatment or action  Current Outpatient Medications on File Prior to Visit  Medication Sig Dispense Refill  . furosemide (LASIX) 80 MG tablet Take 1 tablet (80 mg total) by mouth daily as needed. 30 tablet 11  . levothyroxine (SYNTHROID, LEVOTHROID) 50 MCG tablet Take 1 tablet (50 mcg total) by mouth daily. 90 tablet 0  . naproxen sodium (ANAPROX) 220 MG tablet Take 1 tablet by mouth 2 (two) times daily as needed.    Marland Kitchen omeprazole (PRILOSEC) 20 MG capsule Take 1 capsule (20 mg total) by mouth daily. 90 capsule 3   No current facility-administered medications on file prior to visit.     No Known Allergies  Past Medical History:  Diagnosis Date  . Allergic rhinitis   . Arthritis    Right knee, right elbow  . Complex tear of medial meniscus of right knee 06/26/2016  . Family history of breast cancer    9/19 genetic testing letter sent  . GERD (gastroesophageal reflux disease)   . Hypertension   . Hypothyroidism 2008  . Obstructive sleep apnea    CPAP   --   NOT ABLE TO USE   . Viral meningitis 5/08   [facility]    Past Surgical History:  Procedure Laterality Date  . ABDOMINAL HYSTERECTOMY  1982   Dr Vernie Ammons assisted by Dr Bary Castilla  . APPENDECTOMY     teenager  . BREAST BIOPSY Left 03/09/2015   benign/ Dr. Bary Castilla done in office NEG  . BREAST EXCISIONAL BIOPSY Left 1999   [facility] NEG  . CHONDROPLASTY Right 06/26/2016   Procedure: CHONDROPLASTY;  Surgeon: Marchia Bond, MD;  Location: [place];  Service: Orthopedics;  Laterality: Right;  . KNEE ARTHROSCOPY W/ LASER  8/07   Left  . KNEE ARTHROSCOPY WITH MEDIAL MENISECTOMY Right 06/26/2016   Procedure: RIGHT KNEE ARTHROSCOPY WITH  MEDIAL MENISCECTOMY AND CHONDROPLASTY;  Surgeon: Marchia Bond, MD;  Location: [place];  Service: Orthopedics;  Laterality: Right;  . TONSILLECTOMY     as child  . VESICOVAGINAL FISTULA CLOSURE W/ TAH  1987   Fibroids    Family History  Problem Relation Age of Onset  . Breast cancer Mother 73       BRCA neg  . Hypertension Father   . Cancer Paternal Uncle        pancreatic cancer  . Hypertension Unknown        both sides  . Cancer Unknown        maternal side  . Prostate cancer Maternal Grandfather   . Breast cancer Maternal Aunt 44  . Breast cancer Maternal Aunt 60  . Coronary artery disease Neg Hx   . Diabetes Neg Hx   . Colon cancer Neg Hx   . Liver cancer Neg Hx     Social History   Socioeconomic History  . Marital status: Married    Spouse name: Not on file  . Number of children: 0  . Years of education: Not on file  . Highest education level: Not on file  Occupational History  . Occupation: [employer] in [employer].     Employer: [employer]  . Occupation: Caregiver --part time    Comment:  independent now  Social Needs  . Financial resource strain: Not on file  . Food insecurity:    Worry: Not on file    Inability: Not on file  . Transportation needs:    Medical: Not on file    Non-medical: Not on file  Tobacco Use  . Smoking status: Never Smoker  . Smokeless tobacco: Never Used  Substance and Sexual Activity  . Alcohol use: Yes    Comment: beer on weekends- occasional  . Drug use: No  . Sexual activity: Yes    Birth control/protection: None  Lifestyle  . Physical activity:    Days per week: Not on file    Minutes per session: Not on file  . Stress: Not on file  Relationships  . Social connections:    Talks on phone: Not on file    Gets together: Not on file    Attends religious service: Not on file    Active member of club or organization: Not on file    Attends meetings of clubs or organizations: Not on file    Relationship  status: Not on file  . Intimate partner violence:    Fear of current or ex partner: Not on file    Emotionally abused: Not on file    Physically abused: Not on file    Forced sexual activity: Not on file  Other Topics Concern  . Not on file  Social History Narrative   From [place].   Moved to [place] in high school then back here.     Review of Systems Not sick No history of cold sores No other lesions No trauma to that area    Objective:   Physical Exam  Constitutional: She appears well-developed. No distress.  HENT:  ~7-15m raised moveable mass inside of upper lip (middle) No inflammation Unable to rupture with gentle pressure           Assessment & Plan:

## 2018-01-12 DIAGNOSIS — G4733 Obstructive sleep apnea (adult) (pediatric): Secondary | ICD-10-CM | POA: Diagnosis not present

## 2018-01-20 DIAGNOSIS — D3701 Neoplasm of uncertain behavior of lip: Secondary | ICD-10-CM | POA: Diagnosis not present

## 2018-01-21 ENCOUNTER — Ambulatory Visit
Admission: RE | Admit: 2018-01-21 | Discharge: 2018-01-21 | Disposition: A | Discharge status: Home / Self Care | Payer: BLUE CROSS/BLUE SHIELD | Source: Ambulatory Visit | Attending: Obstetrics & Gynecology | Admitting: Obstetrics & Gynecology

## 2018-01-21 ENCOUNTER — Encounter: Payer: Self-pay | Admitting: Obstetrics & Gynecology

## 2018-01-21 DIAGNOSIS — Z1239 Encounter for other screening for malignant neoplasm of breast: Secondary | ICD-10-CM | POA: Diagnosis not present

## 2018-01-21 DIAGNOSIS — Z1231 Encounter for screening mammogram for malignant neoplasm of breast: Secondary | ICD-10-CM | POA: Diagnosis not present

## 2018-02-10 ENCOUNTER — Encounter: Payer: Self-pay | Admitting: *Deleted

## 2018-02-10 ENCOUNTER — Other Ambulatory Visit: Payer: Self-pay

## 2018-02-11 DIAGNOSIS — G4733 Obstructive sleep apnea (adult) (pediatric): Secondary | ICD-10-CM | POA: Diagnosis not present

## 2018-02-19 ENCOUNTER — Ambulatory Visit
Admission: RE | Admit: 2018-02-19 | Discharge: 2018-02-19 | Disposition: A | Discharge status: Home / Self Care | Payer: BLUE CROSS/BLUE SHIELD | Source: Ambulatory Visit | Attending: Otolaryngology | Admitting: Otolaryngology

## 2018-02-19 ENCOUNTER — Encounter
Admission: RE | Disposition: A | Discharge status: Home / Self Care | Payer: Self-pay | Source: Ambulatory Visit | Attending: Otolaryngology

## 2018-02-19 ENCOUNTER — Ambulatory Visit: Payer: BLUE CROSS/BLUE SHIELD | Admitting: Anesthesiology

## 2018-02-19 DIAGNOSIS — Z79899 Other long term (current) drug therapy: Secondary | ICD-10-CM | POA: Insufficient documentation

## 2018-02-19 DIAGNOSIS — K219 Gastro-esophageal reflux disease without esophagitis: Secondary | ICD-10-CM | POA: Insufficient documentation

## 2018-02-19 DIAGNOSIS — G4730 Sleep apnea, unspecified: Secondary | ICD-10-CM | POA: Insufficient documentation

## 2018-02-19 DIAGNOSIS — K130 Diseases of lips: Secondary | ICD-10-CM | POA: Diagnosis not present

## 2018-02-19 DIAGNOSIS — I10 Essential (primary) hypertension: Secondary | ICD-10-CM | POA: Insufficient documentation

## 2018-02-19 DIAGNOSIS — L83 Acanthosis nigricans: Secondary | ICD-10-CM | POA: Insufficient documentation

## 2018-02-19 DIAGNOSIS — K116 Mucocele of salivary gland: Secondary | ICD-10-CM | POA: Diagnosis not present

## 2018-02-19 DIAGNOSIS — K1370 Unspecified lesions of oral mucosa: Secondary | ICD-10-CM | POA: Diagnosis not present

## 2018-02-19 DIAGNOSIS — Z79890 Hormone replacement therapy (postmenopausal): Secondary | ICD-10-CM | POA: Insufficient documentation

## 2018-02-19 DIAGNOSIS — E039 Hypothyroidism, unspecified: Secondary | ICD-10-CM | POA: Diagnosis not present

## 2018-02-19 HISTORY — DX: Nausea with vomiting, unspecified: R11.2

## 2018-02-19 HISTORY — PX: MASS EXCISION: SHX2000

## 2018-02-19 HISTORY — DX: Presence of dental prosthetic device (complete) (partial): Z97.2

## 2018-02-19 HISTORY — DX: Other specified postprocedural states: Z98.890

## 2018-02-19 SURGERY — EXCISION MASS
Anesthesia: General | Site: Mouth

## 2018-02-19 MED ORDER — LACTATED RINGERS IV SOLN: 500.0000 mL | INTRAVENOUS | Status: DC

## 2018-02-19 MED ORDER — MIDAZOLAM HCL 2 MG/2ML IJ SOLN
INTRAMUSCULAR | Status: DC | PRN
  Administered 2018-02-19: 2 mg via INTRAVENOUS

## 2018-02-19 MED ORDER — PROPOFOL 10 MG/ML IV BOLUS
INTRAVENOUS | Status: DC | PRN
  Administered 2018-02-19: 20 mg via INTRAVENOUS
  Administered 2018-02-19: 50 mg via INTRAVENOUS
  Administered 2018-02-19 (×3): 20 mg via INTRAVENOUS
  Administered 2018-02-19: 10 mg via INTRAVENOUS
  Administered 2018-02-19 (×6): 20 mg via INTRAVENOUS

## 2018-02-19 MED ORDER — HYDROMORPHONE HCL 1 MG/ML IJ SOLN
0.5000 mg | INTRAMUSCULAR | Status: DC | PRN
  Administered 2018-02-19: 10:00:00 via INTRAVENOUS
  Administered 2018-02-19: 0.5 mg via INTRAVENOUS

## 2018-02-19 MED ORDER — ACETAMINOPHEN 10 MG/ML IV SOLN
1000.0000 mg | Freq: Once | INTRAVENOUS | Status: AC
  Administered 2018-02-19: 1000 mg via INTRAVENOUS

## 2018-02-19 MED ORDER — SCOPOLAMINE 1 MG/3DAYS TD PT72
1.0000 | MEDICATED_PATCH | Freq: Once | TRANSDERMAL | Status: DC
  Administered 2018-02-19: 1.5 mg via TRANSDERMAL

## 2018-02-19 MED ORDER — LIDOCAINE HCL (PF) 1 % IJ SOLN
INTRAMUSCULAR | Status: DC | PRN
  Administered 2018-02-19: 1 mL

## 2018-02-19 MED ORDER — KETOROLAC TROMETHAMINE 30 MG/ML IJ SOLN
30.0000 mg | Freq: Once | INTRAMUSCULAR | Status: AC
  Administered 2018-02-19: 30 mg via INTRAVENOUS

## 2018-02-19 MED ORDER — DEXAMETHASONE SODIUM PHOSPHATE 4 MG/ML IJ SOLN: 8.0000 mg | Freq: Once | INTRAMUSCULAR | Status: DC | PRN

## 2018-02-19 MED ORDER — FENTANYL CITRATE (PF) 100 MCG/2ML IJ SOLN
INTRAMUSCULAR | Status: DC | PRN
  Administered 2018-02-19: 50 ug via INTRAVENOUS

## 2018-02-19 MED ORDER — LIDOCAINE HCL (CARDIAC) PF 100 MG/5ML IV SOSY
PREFILLED_SYRINGE | INTRAVENOUS | Status: DC | PRN
  Administered 2018-02-19: 40 mg via INTRAVENOUS

## 2018-02-19 MED ORDER — LACTATED RINGERS IV SOLN
INTRAVENOUS | Status: DC
  Administered 2018-02-19: 08:00:00 via INTRAVENOUS

## 2018-02-19 MED ORDER — ACETAMINOPHEN 325 MG PO TABS: 325.0000 mg | ORAL_TABLET | ORAL | Status: DC | PRN

## 2018-02-19 MED ORDER — ACETAMINOPHEN 160 MG/5ML PO SOLN: 325.0000 mg | ORAL | Status: DC | PRN

## 2018-02-19 SURGICAL SUPPLY — 15 items
BLADE SURG 15 STRL LF DISP TIS (BLADE) ×1 IMPLANT
BLADE SURG 15 STRL SS (BLADE) ×2
CORD BIP STRL DISP 12FT (MISCELLANEOUS) ×1 IMPLANT
DRAPE HEAD BAR (DRAPES) ×2 IMPLANT
GLOVE BIO SURGEON STRL SZ7.5 (GLOVE) ×4 IMPLANT
KIT TURNOVER KIT A (KITS) ×2 IMPLANT
NEEDLE HYPO 25GX1X1/2 BEV (NEEDLE) ×2 IMPLANT
NS IRRIG 500ML POUR BTL (IV SOLUTION) ×2 IMPLANT
PACK ENT CUSTOM (PACKS) ×2 IMPLANT
SOL PREP PVP 2OZ (MISCELLANEOUS) ×2
SOLUTION PREP PVP 2OZ (MISCELLANEOUS) ×1 IMPLANT
STRAP BODY AND KNEE 60X3 (MISCELLANEOUS) ×2 IMPLANT
SUCTION FRAZIER TIP 10 FR DISP (SUCTIONS) ×1 IMPLANT
SUT CHROMIC 5 0 RB 1 27 (SUTURE) ×1 IMPLANT
SYR 10ML LL (SYRINGE) ×2 IMPLANT

## 2018-02-19 NOTE — Transfer of Care (Signed)
Immediate Anesthesia Transfer of Care Note  Patient: Sabrina [name]  Procedure(s) Performed: EXCISION UPPER LIP MUCOCELE (N/A Mouth)  Patient Location: PACU  Anesthesia Type: General  Level of Consciousness: awake, alert  and patient cooperative  Airway and Oxygen Therapy: Patient Spontanous Breathing and Patient connected to supplemental oxygen  Post-op Assessment: Post-op Vital signs reviewed, Patient's Cardiovascular Status Stable, Respiratory Function Stable, Patent Airway and No signs of Nausea or vomiting  Post-op Vital Signs: Reviewed and stable  Complications: No apparent anesthesia complications

## 2018-02-19 NOTE — Anesthesia Postprocedure Evaluation (Signed)
Anesthesia Post Note  Patient: Sabrina [name]  Procedure(s) Performed: EXCISION UPPER LIP MUCOCELE (N/A Mouth)  Patient location during evaluation: PACU Anesthesia Type: General Level of consciousness: awake and alert, oriented and patient cooperative Pain management: pain level controlled Vital Signs Assessment: post-procedure vital signs reviewed and stable Respiratory status: spontaneous breathing, nonlabored ventilation and respiratory function stable Cardiovascular status: blood pressure returned to baseline and stable Postop Assessment: adequate PO intake Anesthetic complications: no Comments: Patient has poor pain tolerance.  Was 7/10 pain in preop and 10/10 postop.  Ultimately reached acceptable pain level of 8/10 with Ofirmev, Dilaudid, and Toradol in PACU, along with lengthy discussion of expectations.    Darrin Nipper

## 2018-02-19 NOTE — Anesthesia Preprocedure Evaluation (Signed)
Anesthesia Evaluation  Patient identified by MRN, date of birth, ID band Patient awake    Reviewed: Allergy & Precautions, NPO status , Patient's Chart, lab work & pertinent test results  History of Anesthesia Complications (+) PONV and history of anesthetic complications  Airway Mallampati: III  TM Distance: >3 FB Neck ROM: Full    Dental  (+) Partial Lower, Partial Upper   Pulmonary sleep apnea and Continuous Positive Airway Pressure Ventilation ,    Pulmonary exam normal breath sounds clear to auscultation       Cardiovascular Exercise Tolerance: Good hypertension, Normal cardiovascular exam Rhythm:Regular Rate:Normal     Neuro/Psych negative neurological ROS     GI/Hepatic GERD  ,  Endo/Other  Hypothyroidism   Renal/GU negative Renal ROS     Musculoskeletal  (+) Arthritis ,   Abdominal   Peds  Hematology negative hematology ROS (+)   Anesthesia Other Findings   Reproductive/Obstetrics                             Anesthesia Physical Anesthesia Plan  ASA: II  Anesthesia Plan: General   Post-op Pain Management:    Induction: Intravenous  PONV Risk Score and Plan: Scopolamine patch - Pre-op, Dexamethasone and Ondansetron  Airway Management Planned: LMA  Additional Equipment:   Intra-op Plan:   Post-operative Plan: Extubation in OR  Informed Consent: I have reviewed the patients History and Physical, chart, labs and discussed the procedure including the risks, benefits and alternatives for the proposed anesthesia with the patient or authorized representative who has indicated his/her understanding and acceptance.     Plan Discussed with: CRNA  Anesthesia Plan Comments:         Anesthesia Quick Evaluation

## 2018-02-19 NOTE — Anesthesia Procedure Notes (Signed)
Performed by: Reyhan Moronta, CRNA Pre-anesthesia Checklist: Patient identified, Emergency Drugs available, Suction available, Timeout performed and Patient being monitored Patient Re-evaluated:Patient Re-evaluated prior to induction Oxygen Delivery Method: Nasal cannula Placement Confirmation: positive ETCO2       

## 2018-02-19 NOTE — Op Note (Signed)
..  02/19/2018  9:50 AM    Lou Miner  [id number]   Pre-Op Dx:  MUCOCELE UPPER LIP  Post-op Dx: MUCOCELE UPPER LIP  Proc: Excision of upper lip mass  Surg: [facility]  Anes:  IV sedation  EBL:  None  Comp:  None  Findings:  Mucocele type lesion adherent to overlying mucosa excised from upper lip.  Procedure: With the patient in a comfortable supine position, IV sedation was administered.  One ml of 1% lidocaine 1:100,000 was injected into the patient's upper lip just adjacent to the mucocele.  A 15 blade scalpel was used to incise the mucosa beneath the lesion and careful dissection`with scissors was made.  This demonstrated a mass adherent to the overlying mucosa.  Therefore the overlying mucosa in continuity with the mass was excised in an elliptical fashion.  Hemostasis was continued with bipolar electrocautery.  The incision was closed in an interupted fashion using 4.0 chromic.    Following this  The patient was returned to anesthesia, awakened, and transferred to recovery in stable condition.  Dispo:  PACU to home  Plan: Follow up in 2 weeks.  Ice for 48 hours.  Leinaala Catanese 9:50 AM 02/19/2018

## 2018-02-19 NOTE — H&P (Signed)
..  History and Physical paper copy reviewed and updated date of procedure and will be scanned into system.  Patient seen and examined.  

## 2018-02-19 NOTE — Discharge Instructions (Signed)
Scopolamine skin patches REMOVE PATCH IN 72 HOURS AND [place] HANDS IMMEDIATELY What is this medicine? SCOPOLAMINE (skoe POL a meen) is used to prevent nausea and vomiting caused by motion sickness, anesthesia and surgery. This medicine may be used for other purposes; ask your health care provider or pharmacist if you have questions. COMMON BRAND NAME(S): Transderm Scop What should I tell my health care provider before I take this medicine? They need to know if you have any of these conditions: -glaucoma -kidney or liver disease -an unusual or allergic reaction (especially skin allergy) to scopolamine, atropine, other medicines, foods, dyes, or preservatives -pregnant or trying to get pregnant -breast-feeding How should I use this medicine? This medicine is for external use only. Follow the directions on the prescription label. One patch contains enough medicine to prevent motion sickness for up to 3 days. Apply the patch at least 4 hours before you need it and only wear one disc at a time. Choose an area behind the ear, that is clean, dry, hairless and free from any cuts or irritation. Wipe the area with a clean dry tissue. Peel off the plastic backing of the skin patch, trying not to touch the adhesive side with your hands. Do not cut the patches. Firmly apply to the area you have chosen, with the metallic side of the patch to the skin and the tan-colored side showing. Once firmly in place, wash your hands well with soap and water. Remove the disc after 3 days, or sooner if you no longer need it. After removing the patch, wash your hands and the area behind your ear thoroughly with soap and water. The patch will still contain some medicine after use. To avoid accidental contact or ingestion by children or pets, fold the used patch in half with the sticky side together and throw away in the trash out of the reach of children and pets. If you need to use a second patch after you remove the first, place it  behind the other ear. Talk to your pediatrician regarding the use of this medicine in children. Special care may be needed. Overdosage: If you think you have taken too much of this medicine contact a poison control center or emergency room at once. NOTE: This medicine is only for you. Do not share this medicine with others. What if I miss a dose? Make sure you apply the patch at least 4 hours before you need it. You can apply it the night before traveling. What may interact with this medicine? -benztropine -bethanechol -medicines for anxiety or sleeping problems like diazepam or temazepam -medicines for hay fever and other allergies -medicines for mental depression -muscle relaxants This list may not describe all possible interactions. Give your health care provider a list of all the medicines, herbs, non-prescription drugs, or dietary supplements you use. Also tell them if you smoke, drink alcohol, or use illegal drugs. Some items may interact with your medicine. What should I watch for while using this medicine? Keep the patch dry, if possible, to prevent it from falling off. Limited contact with water, however, as in bathing or swimming, will not affect the system. If the patch falls off, throw it away and put a new one behind the other ear. You may get drowsy or dizzy. Do not drive, use machinery, or do anything that needs mental alertness until you know how this medicine affects you. Do not stand or sit up quickly, especially if you are an older patient. This reduces the  risk of dizzy or fainting spells. Alcohol may interfere with the effect of this medicine. Avoid alcoholic drinks. Your mouth may get dry. Chewing sugarless gum or sucking hard candy, and drinking plenty of water may help. Contact your doctor if the problem does not go away or is severe. This medicine may cause dry eyes and blurred vision. If you wear contact lenses you may feel some discomfort. Lubricating drops may help. See  your eye doctor if the problem does not go away or is severe. If you are going to have a magnetic resonance imaging (MRI) procedure, tell your MRI technician if you have this patch on your body. It must be removed before a MRI. What side effects may I notice from receiving this medicine? Side effects that you should report to your doctor or health care professional as soon as possible: -agitation, nervousness, confusion -blurred vision and other eye problems -dizziness, drowsiness -eye pain or redness in the whites of the eye -hallucinations -pain or difficulty passing urine -skin rash, itching -vomiting Side effects that usually do not require medical attention (report to your doctor or health care professional if they continue or are bothersome): -headache -nausea This list may not describe all possible side effects. Call your doctor for medical advice about side effects. You may report side effects to FDA at 1-800-FDA-1088. Where should I keep my medicine? Keep out of the reach of children. Store at room temperature between 20 and 25 degrees C (68 and 77 degrees F). Throw away any unused medicine after the expiration date. When you remove a patch, fold it and throw it in the trash as described above. NOTE: This sheet is a summary. It may not cover all possible information. If you have questions about this medicine, talk to your doctor, pharmacist, or health care provider.  2018 Elsevier/Gold Standard (2011-09-06 13:31:48)   General Anesthesia, Adult, Care After These instructions provide you with information about caring for yourself after your procedure. Your health care provider may also give you more specific instructions. Your treatment has been planned according to current medical practices, but problems sometimes occur. Call your health care provider if you have any problems or questions after your procedure. What can I expect after the procedure? After the procedure, it is common  to have:  Vomiting.  A sore throat.  Mental slowness.  It is common to feel:  Nauseous.  Cold or shivery.  Sleepy.  Tired.  Sore or achy, even in parts of your body where you did not have surgery.  Follow these instructions at home: For at least 24 hours after the procedure:  Do not: ? Participate in activities where you could fall or become injured. ? Drive. ? Use heavy machinery. ? Drink alcohol. ? Take sleeping pills or medicines that cause drowsiness. ? Make important decisions or sign legal documents. ? Take care of children on your own.  Rest. Eating and drinking  If you vomit, drink water, juice, or soup when you can drink without vomiting.  Drink enough fluid to keep your urine clear or pale yellow.  Make sure you have little or no nausea before eating solid foods.  Follow the diet recommended by your health care provider. General instructions  Have a responsible adult stay with you until you are awake and alert.  Return to your normal activities as told by your health care provider. Ask your health care provider what activities are safe for you.  Take over-the-counter and prescription medicines only as told  by your health care provider.  If you smoke, do not smoke without supervision.  Keep all follow-up visits as told by your health care provider. This is important. Contact a health care provider if:  You continue to have nausea or vomiting at home, and medicines are not helpful.  You cannot drink fluids or start eating again.  You cannot urinate after 8-12 hours.  You develop a skin rash.  You have fever.  You have increasing redness at the site of your procedure. Get help right away if:  You have difficulty breathing.  You have chest pain.  You have unexpected bleeding.  You feel that you are having a life-threatening or urgent problem. This information is not intended to replace advice given to you by your health care provider.  Make sure you discuss any questions you have with your health care provider. Document Released: 07/16/2000 Document Revised: 09/12/2015 Document Reviewed: 03/24/2015 Elsevier Interactive Patient Education  [employer].

## 2018-02-20 ENCOUNTER — Encounter: Payer: Self-pay | Admitting: Otolaryngology

## 2018-02-24 DIAGNOSIS — G4733 Obstructive sleep apnea (adult) (pediatric): Secondary | ICD-10-CM | POA: Diagnosis not present

## 2018-02-25 LAB — SURGICAL PATHOLOGY

## 2018-03-26 DIAGNOSIS — G4733 Obstructive sleep apnea (adult) (pediatric): Secondary | ICD-10-CM | POA: Diagnosis not present

## 2018-10-15 ENCOUNTER — Encounter: Payer: Self-pay | Admitting: Internal Medicine

## 2018-10-15 ENCOUNTER — Telehealth: Payer: Self-pay

## 2018-10-15 ENCOUNTER — Other Ambulatory Visit: Payer: Self-pay

## 2018-10-15 ENCOUNTER — Ambulatory Visit (INDEPENDENT_AMBULATORY_CARE_PROVIDER_SITE_OTHER): Payer: 59 | Admitting: Internal Medicine

## 2018-10-15 VITALS — BP 122/80 | HR 65 | Temp 98.3°F | Ht 63.5 in | Wt 248.0 lb

## 2018-10-15 DIAGNOSIS — E039 Hypothyroidism, unspecified: Secondary | ICD-10-CM | POA: Diagnosis not present

## 2018-10-15 DIAGNOSIS — K219 Gastro-esophageal reflux disease without esophagitis: Secondary | ICD-10-CM

## 2018-10-15 DIAGNOSIS — I872 Venous insufficiency (chronic) (peripheral): Secondary | ICD-10-CM

## 2018-10-15 DIAGNOSIS — Z0000 Encounter for general adult medical examination without abnormal findings: Secondary | ICD-10-CM

## 2018-10-15 DIAGNOSIS — G4733 Obstructive sleep apnea (adult) (pediatric): Secondary | ICD-10-CM

## 2018-10-15 MED ORDER — LEVOTHYROXINE SODIUM 50 MCG PO TABS: 50.0000 ug | ORAL_TABLET | Freq: Every day | ORAL | 0 refills | Status: DC

## 2018-10-15 MED ORDER — FUROSEMIDE 80 MG PO TABS: 80.0000 mg | ORAL_TABLET | Freq: Every day | ORAL | 11 refills | Status: DC | PRN

## 2018-10-15 MED ORDER — OMEPRAZOLE 20 MG PO CPDR: 20.0000 mg | DELAYED_RELEASE_CAPSULE | Freq: Every day | ORAL | 3 refills | Status: DC

## 2018-10-15 NOTE — Telephone Encounter (Signed)
I am not big on vitamins, but it probably would be a good idea for her to take a general multivitamin (especially with her stress and trouble eating regular meals, etc)

## 2018-10-15 NOTE — Assessment & Plan Note (Signed)
Uses the CPAP sporadically--- 2-3 days per week mostly Hard to use it with the care giving for husband, etc

## 2018-10-15 NOTE — Progress Notes (Signed)
Subjective:    Patient ID: Sabrina [name], female    DOB: 01-06-1956, 63 y.o.   MRN: [id number]  HPI Here for physical  Having stress with husband's diagnosis of lung cancer No spread to brain Got chemo---too strong for his heart (caused another MI) Will be starting RT now  Still at [employer]----3rd shift Hoping to just work another year or so (will get her 20 years) "I can't take it any longer" And knows she will need to care for husband  Has not been consistent with medications No thyroid medication since December---does feel groggy, etc  Swelling in legs does occur if she is on her feet all day Hasn't used the lasix Has support hose--causes pain  Current Outpatient Medications on File Prior to Visit  Medication Sig Dispense Refill  . furosemide (LASIX) 80 MG tablet Take 1 tablet (80 mg total) by mouth daily as needed. 30 tablet 11  . levothyroxine (SYNTHROID, LEVOTHROID) 50 MCG tablet Take 1 tablet (50 mcg total) by mouth daily. 90 tablet 0  . naproxen sodium (ANAPROX) 220 MG tablet Take 1 tablet by mouth 2 (two) times daily as needed.    Marland Kitchen omeprazole (PRILOSEC) 20 MG capsule Take 1 capsule (20 mg total) by mouth daily. 90 capsule 3   No current facility-administered medications on file prior to visit.     No Known Allergies  Past Medical History:  Diagnosis Date  . Allergic rhinitis   . Arthritis    Right knee, right elbow  . Complex tear of medial meniscus of right knee 06/26/2016  . Family history of breast cancer    9/19 genetic testing letter sent  . GERD (gastroesophageal reflux disease)   . Hypertension   . Hypothyroidism 2008  . Obstructive sleep apnea    CPAP   --   NOT ABLE TO USE   . PONV (postoperative nausea and vomiting)    after knee surgery  . Viral meningitis 5/08   [facility]  . Wears dentures    partial upper and lower    Past Surgical History:  Procedure Laterality Date  . ABDOMINAL HYSTERECTOMY  1982   Dr Vernie Ammons assisted by Dr Bary Castilla  .  APPENDECTOMY     teenager  . BREAST BIOPSY Left 03/09/2015   benign/ Dr. Bary Castilla done in office NEG  . BREAST EXCISIONAL BIOPSY Left 1999   [facility] NEG  . CHONDROPLASTY Right 06/26/2016   Procedure: CHONDROPLASTY;  Surgeon: Marchia Bond, MD;  Location: [place];  Service: Orthopedics;  Laterality: Right;  . KNEE ARTHROSCOPY W/ LASER  8/07   Left  . KNEE ARTHROSCOPY WITH MEDIAL MENISECTOMY Right 06/26/2016   Procedure: RIGHT KNEE ARTHROSCOPY WITH MEDIAL MENISCECTOMY AND CHONDROPLASTY;  Surgeon: Marchia Bond, MD;  Location: [place];  Service: Orthopedics;  Laterality: Right;  . MASS EXCISION N/A 02/19/2018   Procedure: EXCISION UPPER LIP MUCOCELE;  Surgeon: Carloyn Manner, MD;  Location: [place];  Service: ENT;  Laterality: N/A;  sleep apnea  . TONSILLECTOMY     as child  . VESICOVAGINAL FISTULA CLOSURE W/ TAH  1987   Fibroids    Family History  Problem Relation Age of Onset  . Breast cancer Mother 66       BRCA neg  . Hypertension Father   . Cancer Paternal Uncle        pancreatic cancer  . Hypertension Unknown        both sides  . Cancer Unknown  maternal side  . Prostate cancer Maternal Grandfather   . Breast cancer Maternal Aunt 58  . Breast cancer Maternal Aunt 60  . Coronary artery disease Neg Hx   . Diabetes Neg Hx   . Colon cancer Neg Hx   . Liver cancer Neg Hx     Social History   Socioeconomic History  . Marital status: Married    Spouse name: Not on file  . Number of children: 0  . Years of education: Not on file  . Highest education level: Not on file  Occupational History  . Occupation: [employer] in [employer].     Employer: [employer]  . Occupation: Caregiver --part time    Comment:  independent now  Social Needs  . Financial resource strain: Not on file  . Food insecurity    Worry: Not on file    Inability: Not on file  . Transportation needs    Medical: Not on file    Non-medical:  Not on file  Tobacco Use  . Smoking status: Never Smoker  . Smokeless tobacco: Never Used  Substance and Sexual Activity  . Alcohol use: Yes    Comment: beer on weekends- occasional  . Drug use: No  . Sexual activity: Yes    Birth control/protection: None  Lifestyle  . Physical activity    Days per week: Not on file    Minutes per session: Not on file  . Stress: Not on file  Relationships  . Social Herbalist on phone: Not on file    Gets together: Not on file    Attends religious service: Not on file    Active member of club or organization: Not on file    Attends meetings of clubs or organizations: Not on file    Relationship status: Not on file  . Intimate partner violence    Fear of current or ex partner: Not on file    Emotionally abused: Not on file    Physically abused: Not on file    Forced sexual activity: Not on file  Other Topics Concern  . Not on file  Social History Narrative   From [place].   Moved to [place] in high school then back here.     Review of Systems  Constitutional: Positive for fatigue. Negative for unexpected weight change.       Wears seat belt  HENT: Negative for dental problem, hearing loss, tinnitus and trouble swallowing.        Keeps up with dentist  Eyes: Negative for visual disturbance.       No diplopia or unilateral vision loss  Respiratory: Negative for cough, chest tightness and shortness of breath.   Cardiovascular: Negative for chest pain and palpitations.  Gastrointestinal: Negative for blood in stool and constipation.       Does get some heartburn  Endocrine: Negative for polydipsia and polyuria.  Genitourinary: Negative for dysuria and hematuria.  Musculoskeletal: Negative for arthralgias, back pain and joint swelling.  Skin: Negative for rash.       No suspicious lesions  Allergic/Immunologic: Positive for environmental allergies. Negative for immunocompromised state.       Uses claritin prn  Neurological:  Positive for headaches. Negative for dizziness, syncope and light-headedness.  Hematological: Negative for adenopathy. Bruises/bleeds easily.  Psychiatric/Behavioral: Positive for sleep disturbance. Negative for dysphoric mood. The patient is nervous/anxious.        Trouble sleeping----has chores to do after work and can't  even lie down till 10AM (then up to get lunch for her husband)       Objective:   Physical Exam  Constitutional: She is oriented to person, place, and time. She appears well-developed. No distress.  HENT:  Head: Normocephalic and atraumatic.  Right Ear: External ear normal.  Left Ear: External ear normal.  Mouth/Throat: Oropharynx is clear and moist. No oropharyngeal exudate.  Eyes: Pupils are equal, round, and reactive to light. Conjunctivae are normal.  Neck: No thyromegaly present.  Cardiovascular: Normal rate, regular rhythm, normal heart sounds and intact distal pulses. Exam reveals no gallop.  No murmur heard. Respiratory: Effort normal and breath sounds normal. No respiratory distress. She has no wheezes. She has no rales.  GI: Soft. There is no abdominal tenderness.  Musculoskeletal:        General: No tenderness.     Comments: 2-3+ non pitting calf edema  Lymphadenopathy:    She has no cervical adenopathy.  Neurological: She is alert and oriented to person, place, and time.  Skin: No rash noted. No erythema.  Psychiatric: She has a normal mood and affect. Her behavior is normal.           Assessment & Plan:

## 2018-10-15 NOTE — Assessment & Plan Note (Signed)
Has had lots of stress plus COVID Needs to work on fitness--but hard with husband's cancer and just starting RT Mammogram yearly due to FH---due October Colon due 2024 No pap due to hyster--but gyn has been doing it Still prefers no flu vaccine--discussed

## 2018-10-15 NOTE — Telephone Encounter (Signed)
Pt left v/m that she just had visit with Dr Silvio Pate and pt wants to know if Dr Silvio Pate thinks pt should be taking a vitamin and if so which vitamin would Dr Silvio Pate recommend. Pt request cb.

## 2018-10-15 NOTE — Assessment & Plan Note (Signed)
Will use the lasix prn

## 2018-10-15 NOTE — Assessment & Plan Note (Signed)
Stopped Rx 6 months ago Expect she is underactive now---will just restart and check again if not more energy in 2 months

## 2018-10-15 NOTE — Assessment & Plan Note (Signed)
Some symptoms Will refill the PPI

## 2018-10-16 NOTE — Telephone Encounter (Signed)
Left message on VM per DPR. 

## 2018-10-18 LAB — COMPREHENSIVE METABOLIC PANEL
ALT: 12 IU/L (ref 0–32)
AST: 12 IU/L (ref 0–40)
Albumin/Globulin Ratio: 1.3 (ref 1.2–2.2)
Albumin: 4.3 g/dL (ref 3.8–4.9)
Alkaline Phosphatase: 67 IU/L (ref 39–117)
BUN/Creatinine Ratio: 20 (ref 12–28)
BUN: 17 mg/dL (ref 8–27)
Bilirubin Total: 0.3 mg/dL (ref 0.0–1.2)
CO2: 21 mmol/L (ref 20–29)
Calcium: 9.6 mg/dL (ref 8.7–10.3)
Chloride: 106 mmol/L (ref 96–106)
Creatinine, Ser: 0.86 mg/dL (ref 0.57–1.00)
GFR calc Af Amer: 85 mL/min/{1.73_m2} (ref >59)
GFR calc non Af Amer: 74 mL/min/{1.73_m2} (ref >59)
Globulin, Total: 3.2 g/dL (ref 1.5–4.5)
Glucose: 107 mg/dL — ABNORMAL HIGH (ref 65–99)
Potassium: 4.6 mmol/L (ref 3.5–5.2)
Sodium: 144 mmol/L (ref 134–144)
Total Protein: 7.5 g/dL (ref 6.0–8.5)

## 2018-10-18 LAB — CBC
Hematocrit: 38.3 % (ref 34.0–46.6)
Hemoglobin: 13.3 g/dL (ref 11.1–15.9)
MCH: 30.5 pg (ref 26.6–33.0)
MCHC: 34.7 g/dL (ref 31.5–35.7)
MCV: 88 fL (ref 79–97)
Platelets: 282 10*3/uL (ref 150–450)
RBC: 4.36 x10E6/uL (ref 3.77–5.28)
RDW: 12.5 % (ref 11.7–15.4)
WBC: 9.6 10*3/uL (ref 3.4–10.8)

## 2018-10-18 LAB — T4, FREE: Free T4: 0.98 ng/dL (ref 0.82–1.77)

## 2018-10-18 LAB — TSH: TSH: 3.42 u[IU]/mL (ref 0.450–4.500)

## 2018-12-22 ENCOUNTER — Other Ambulatory Visit: Payer: Self-pay

## 2018-12-22 ENCOUNTER — Ambulatory Visit (INDEPENDENT_AMBULATORY_CARE_PROVIDER_SITE_OTHER): Payer: 59 | Admitting: Obstetrics & Gynecology

## 2018-12-22 ENCOUNTER — Encounter: Payer: Self-pay | Admitting: Obstetrics & Gynecology

## 2018-12-22 VITALS — BP 132/80 | Ht 64.0 in | Wt 253.0 lb

## 2018-12-22 DIAGNOSIS — Z124 Encounter for screening for malignant neoplasm of cervix: Secondary | ICD-10-CM

## 2018-12-22 DIAGNOSIS — Z01419 Encounter for gynecological examination (general) (routine) without abnormal findings: Secondary | ICD-10-CM

## 2018-12-22 DIAGNOSIS — Z1239 Encounter for other screening for malignant neoplasm of breast: Secondary | ICD-10-CM

## 2018-12-22 NOTE — Progress Notes (Signed)
HPI:      [name] is a 63 y.o. G0P0000 who LMP was in the past, she presents today for her annual examination.  The patient has no complaints today. The patient is not currently sexually active. Herlast pap: approximate date 2019 and was normal and last mammogram: approximate date 2019 and was normal.  The patient does perform self breast exams.  There is no notable family history of breast or ovarian cancer in her family. The patient is not taking hormone replacement therapy. Patient denies post-menopausal vaginal bleeding.   The patient has regular exercise: yes. The patient denies current symptoms of depression.    GYN Hx: Last Colonoscopy:1 year ago. Normal.  Last DEXA: never ago.    PMHx: Past Medical History:  Diagnosis Date  . Allergic rhinitis   . Arthritis    Right knee, right elbow  . Complex tear of medial meniscus of right knee 06/26/2016  . Family history of breast cancer    9/19 genetic testing letter sent  . GERD (gastroesophageal reflux disease)   . Hypertension   . Hypothyroidism 2008  . Obstructive sleep apnea    CPAP   --   NOT ABLE TO USE   . PONV (postoperative nausea and vomiting)    after knee surgery  . Viral meningitis 5/08   [facility]  . Wears dentures    partial upper and lower   Past Surgical History:  Procedure Laterality Date  . ABDOMINAL HYSTERECTOMY  1982   Dr Vernie Ammons assisted by Dr Bary Castilla  . APPENDECTOMY     teenager  . BREAST BIOPSY Left 03/09/2015   benign/ Dr. Bary Castilla done in office NEG  . BREAST EXCISIONAL BIOPSY Left 1999   [facility] NEG  . CHONDROPLASTY Right 06/26/2016   Procedure: CHONDROPLASTY;  Surgeon: Marchia Bond, MD;  Location: [place] [place];  Service: Orthopedics;  Laterality: Right;  . KNEE ARTHROSCOPY W/ LASER  8/07   Left  . KNEE ARTHROSCOPY WITH MEDIAL MENISECTOMY Right 06/26/2016   Procedure: RIGHT KNEE ARTHROSCOPY WITH MEDIAL MENISCECTOMY AND CHONDROPLASTY;  Surgeon: Marchia Bond, MD;  Location:  [place];  Service: Orthopedics;  Laterality: Right;  . MASS EXCISION N/A 02/19/2018   Procedure: EXCISION UPPER LIP MUCOCELE;  Surgeon: Carloyn Manner, MD;  Location: [place];  Service: ENT;  Laterality: N/A;  sleep apnea  . TONSILLECTOMY     as child  . VESICOVAGINAL FISTULA CLOSURE W/ TAH  1987   Fibroids   Family History  Problem Relation Age of Onset  . Breast cancer Mother 62       BRCA neg  . Hypertension Father   . Cancer Paternal Uncle        pancreatic cancer  . Hypertension Other        both sides  . Cancer Other        maternal side  . Prostate cancer Maternal Grandfather   . Breast cancer Maternal Aunt 79  . Breast cancer Maternal Aunt 60  . Coronary artery disease Neg Hx   . Diabetes Neg Hx   . Colon cancer Neg Hx   . Liver cancer Neg Hx    Social History   Tobacco Use  . Smoking status: Never Smoker  . Smokeless tobacco: Never Used  Substance Use Topics  . Alcohol use: Yes    Comment: beer on weekends- occasional  . Drug use: No    Current Outpatient Medications:  .  levothyroxine (SYNTHROID) 50  MCG tablet, Take 1 tablet (50 mcg total) by mouth daily., Disp: 90 tablet, Rfl: 0 .  furosemide (LASIX) 80 MG tablet, Take 1 tablet (80 mg total) by mouth daily as needed. (Patient not taking: Reported on 12/22/2018), Disp: 30 tablet, Rfl: 11 .  naproxen sodium (ANAPROX) 220 MG tablet, Take 1 tablet by mouth 2 (two) times daily as needed., Disp: , Rfl:  .  omeprazole (PRILOSEC) 20 MG capsule, Take 1 capsule (20 mg total) by mouth daily. (Patient not taking: Reported on 12/22/2018), Disp: 90 capsule, Rfl: 3 Allergies: Patient has no known allergies.  Review of Systems  Constitutional: Negative for chills, fever and malaise/fatigue.  HENT: Negative for congestion, sinus pain and sore throat.   Eyes: Negative for blurred vision and pain.  Respiratory: Negative for cough and wheezing.   Cardiovascular: Negative for chest pain and leg  swelling.  Gastrointestinal: Negative for abdominal pain, constipation, diarrhea, heartburn, nausea and vomiting.  Genitourinary: Negative for dysuria, frequency, hematuria and urgency.  Musculoskeletal: Negative for back pain, joint pain, myalgias and neck pain.  Skin: Positive for itching. Negative for rash.  Neurological: Negative for dizziness, tremors and weakness.  Endo/Heme/Allergies: Does not bruise/bleed easily.  Psychiatric/Behavioral: Negative for depression. The patient is not nervous/anxious and does not have insomnia.     Objective: BP 132/80   Ht '5\' 4"'$  (1.626 m)   Wt 253 lb (114.8 kg)   BMI 43.43 kg/m   Filed Weights   12/22/18 1351  Weight: 253 lb (114.8 kg)   Body mass index is 43.43 kg/m. Physical Exam Constitutional:      General: She is not in acute distress.    Appearance: She is well-developed.  Genitourinary:     Pelvic exam was performed with patient supine.     Vagina and rectum normal.     No lesions in the vagina.     No vaginal bleeding.     No right or left adnexal mass present.     Right adnexa not tender.     Left adnexa not tender.     Genitourinary Comments: Absent Uterus Absent cervix Vaginal cuff well healed  HENT:     Head: Normocephalic and atraumatic. No laceration.     Right Ear: Hearing normal.     Left Ear: Hearing normal.     Mouth/Throat:     Pharynx: Uvula midline.  Eyes:     Pupils: Pupils are equal, round, and reactive to light.  Neck:     Musculoskeletal: Normal range of motion and neck supple.     Thyroid: No thyromegaly.  Cardiovascular:     Rate and Rhythm: Normal rate and regular rhythm.     Heart sounds: No murmur. No friction rub. No gallop.   Pulmonary:     Effort: Pulmonary effort is normal. No respiratory distress.     Breath sounds: Normal breath sounds. No wheezing.  Chest:     Breasts:        Right: No mass, skin change or tenderness.        Left: No mass, skin change or tenderness.  Abdominal:      General: Bowel sounds are normal. There is no distension.     Palpations: Abdomen is soft.     Tenderness: There is no abdominal tenderness. There is no rebound.  Musculoskeletal: Normal range of motion.  Neurological:     Mental Status: She is alert and oriented to person, place, and time.     Cranial Nerves:  No cranial nerve deficit.  Skin:    General: Skin is warm and dry.  Psychiatric:        Judgment: Judgment normal.  Vitals signs reviewed.     Assessment: Annual Exam 1. Women's annual routine gynecological examination   2. Screening for cervical cancer   3. Screening for breast cancer     Plan:            1.  Vaginal Screening-  Pap smear done today  2. Breast screening- Exam annually and mammogram scheduled  3. Colonoscopy every 10 years, Hemoccult testing after age 96  4. Labs managed by PCP  5. Counseling for hormonal therapy: none              6. FRAX - FRAX score for assessing the 10 year probability for fracture calculated and discussed today.  Based on age and score today, DEXA is not scheduled.    F/U  Return in about 1 year (around 12/22/2019) for Annual.  Barnett Applebaum, MD, [facility], [facility] 12/22/2018  2:14 PM

## 2018-12-22 NOTE — Patient Instructions (Addendum)
PAP every three years Mammogram every year    Call [phone] to schedule at [facility] [place] Colonoscopy every 5 years Labs yearly (with PCP)  Benedryl gel for abdominal skin itching

## 2018-12-24 LAB — PAP IG (IMAGE GUIDED)

## 2019-01-17 ENCOUNTER — Other Ambulatory Visit: Payer: Self-pay | Admitting: Internal Medicine

## 2019-02-17 ENCOUNTER — Other Ambulatory Visit: Payer: Self-pay | Admitting: Obstetrics & Gynecology

## 2019-02-17 DIAGNOSIS — Z1239 Encounter for other screening for malignant neoplasm of breast: Secondary | ICD-10-CM

## 2019-04-28 ENCOUNTER — Ambulatory Visit: Payer: No Typology Code available for payment source | Attending: Internal Medicine

## 2019-04-28 DIAGNOSIS — Z20822 Contact with and (suspected) exposure to covid-19: Secondary | ICD-10-CM

## 2019-04-30 ENCOUNTER — Telehealth: Payer: Self-pay

## 2019-04-30 LAB — NOVEL CORONAVIRUS, NAA: SARS-CoV-2, NAA: NOT DETECTED

## 2019-04-30 NOTE — Telephone Encounter (Signed)
Caller given negative result and verbalized understanding  

## 2019-05-18 ENCOUNTER — Ambulatory Visit
Admission: RE | Admit: 2019-05-18 | Discharge: 2019-05-18 | Disposition: A | Discharge status: Home / Self Care | Payer: No Typology Code available for payment source | Source: Ambulatory Visit | Attending: Obstetrics & Gynecology | Admitting: Obstetrics & Gynecology

## 2019-05-18 DIAGNOSIS — Z1231 Encounter for screening mammogram for malignant neoplasm of breast: Secondary | ICD-10-CM | POA: Insufficient documentation

## 2019-05-18 DIAGNOSIS — Z1239 Encounter for other screening for malignant neoplasm of breast: Secondary | ICD-10-CM | POA: Diagnosis present

## 2019-05-19 ENCOUNTER — Encounter: Payer: Self-pay | Admitting: Obstetrics & Gynecology

## 2019-06-26 ENCOUNTER — Other Ambulatory Visit: Payer: Self-pay

## 2019-06-26 ENCOUNTER — Ambulatory Visit: Payer: Self-pay | Attending: Internal Medicine

## 2019-06-26 DIAGNOSIS — Z23 Encounter for immunization: Secondary | ICD-10-CM | POA: Insufficient documentation

## 2019-06-26 NOTE — Progress Notes (Signed)
   Covid-19 Vaccination Clinic  Name:  Sabrina [name]    MRN: [id number] DOB: Apr 05, 1956  06/26/2019  [name] was observed post Covid-19 immunization for 15 minutes without incident. She was provided with Vaccine Information Sheet and instruction to access the V-Safe system.   [name] was instructed to call 911 with any severe reactions post vaccine: Marland Kitchen Difficulty breathing  . Swelling of face and throat  . A fast heartbeat  . A bad rash all over body  . Dizziness and weakness   Immunizations Administered    Name Date Dose VIS Date Route   Pfizer COVID-19 Vaccine 06/26/2019 10:13 AM 0.3 mL 04/03/2019 Intramuscular   Manufacturer: [place]   Lot: [id number]   [place]: [id number]

## 2019-07-17 ENCOUNTER — Ambulatory Visit: Payer: Self-pay | Attending: Internal Medicine

## 2019-07-17 DIAGNOSIS — Z23 Encounter for immunization: Secondary | ICD-10-CM

## 2019-07-17 NOTE — Progress Notes (Signed)
   Covid-19 Vaccination Clinic  Name:  Sabrina [name]    MRN: [id number] DOB: 09-20-1955  07/17/2019  [name] was observed post Covid-19 immunization for 15 minutes without incident. She was provided with Vaccine Information Sheet and instruction to access the V-Safe system.   [name] was instructed to call 911 with any severe reactions post vaccine: Marland Kitchen Difficulty breathing  . Swelling of face and throat  . A fast heartbeat  . A bad rash all over body  . Dizziness and weakness   Immunizations Administered    Name Date Dose VIS Date Route   Pfizer COVID-19 Vaccine 07/17/2019 10:04 AM 0.3 mL 04/03/2019 Intramuscular   Manufacturer: [place]   Lot: [id number]   [place]: [id number]

## 2019-07-21 ENCOUNTER — Telehealth: Payer: Self-pay

## 2019-07-21 ENCOUNTER — Encounter: Payer: Self-pay | Admitting: Internal Medicine

## 2019-07-21 ENCOUNTER — Other Ambulatory Visit: Payer: Self-pay

## 2019-07-21 ENCOUNTER — Ambulatory Visit: Payer: 59 | Admitting: Internal Medicine

## 2019-07-21 DIAGNOSIS — M79622 Pain in left upper arm: Secondary | ICD-10-CM | POA: Diagnosis not present

## 2019-07-21 NOTE — Telephone Encounter (Signed)
Pt already has appt with Dr Silvio Pate today at 11 AM.

## 2019-07-21 NOTE — Telephone Encounter (Signed)
[facility] - Client TELEPHONE ADVICE RECORD [facility] Patient Name: Sabrina [name] Gender: Female DOB: Jul 18, 1955 Age: 64 Y 21 M 29 D Return Phone Number: [phone] (Primary), [phone] (Secondary) Address: City/State/Zip: [place] [address] Client [facility] - Client Client Site [place] - Day Physician Viviana Simpler - MD Contact Type Call Who Is Calling Patient / Member / Family / Caregiver Call Type Triage / Clinical Relationship To Patient Self Return Phone Number [phone] (Primary) Chief Complaint CHEST PAIN (>=21 years) - pain, pressure, heaviness or tightness Reason for Call Symptomatic / Request for [place] states, pt has left side pain under arm pit reaching to her breast. Does have a 11am appt. Translation No Nurse Assessment Nurse: Rock Nephew, RN, Juliann Pulse Date/Time (Eastern Time): 07/21/2019 8:55:33 AM Confirm and document reason for call. If symptomatic, describe symptoms. ---Caller stated an appt was made for her for today at 11am but office wanted her to talk with a triage nurse. She complains of left arm pain that extends from axilla to mid upper arm. She did have a COVID vaccine in that arm on Friday. Has the patient had close contact with a person known or suspected to have the novel coronavirus illness OR traveled / lives in area with major community spread (including international travel) in the last 14 days from the onset of symptoms? * If Asymptomatic, screen for exposure and travel within the last 14 days. ---No Does the patient have any new or worsening symptoms? ---Yes Will a triage be completed? ---Yes Related visit to physician within the last 2 weeks? ---Yes Does the PT have any chronic conditions? (i.e. diabetes, asthma, this includes High risk factors for pregnancy, etc.) ---No Is this a behavioral health or substance abuse  call? ---No Guidelines Guideline Title Affirmed Question Affirmed Notes Nurse Date/Time Eilene Ghazi Time) COVID-19 - Vaccine Questions and Reactions [1] Pain, tenderness, or swelling at the injection site AND [2] over 3 days (72 hours) since vaccine AND [3] getting worse Rock Nephew, RN, Juliann Pulse 07/21/2019 8:59:07 AM PLEASE NOTE: All timestamps contained within this report are represented as [place] Standard Time. CONFIDENTIALTY NOTICE: This fax transmission is intended only for the addressee. It contains information that is legally privileged, confidential or otherwise protected from use or disclosure. If you are not the intended recipient, you are strictly prohibited from reviewing, disclosing, copying using or disseminating any of this information or taking any action in reliance on or regarding this information. If you have received this fax in error, please notify us immediately by telephone so that we can arrange for its return to [place]. Phone: [phone], Toll-Free: [phone], Fax: [phone] Page: 2 of 2 Call Id: [id number] [place]. Time Eilene Ghazi Time) Disposition Final User 07/21/2019 8:53:05 AM Send to Urgent Queue Lonia Farber 07/21/2019 9:02:14 AM See PCP within 24 Hours Yes Rock Nephew, RN, Gara Kroner Disagree/Comply Comply Caller Understands Yes PreDisposition Call Doctor Care Advice Given Per Guideline SEE PCP WITHIN 24 HOURS: * IF OFFICE WILL BE OPEN: You need to be seen within the next 24 hours. Call your doctor (or NP/PA) when the office opens and make an appointment. LOCAL HEAT FOR PAIN: * Apply a warm wet washcloth or a heating pad for 20 minutes 4 times a day for pain relief. CALL BACK IF: * Fever occurs * You become worse. CARE ADVICE given per COVID-19 - Vaccine Questions and Reactions (Adult) guideline. Referrals REFERRED TO PCP OFFICE

## 2019-07-21 NOTE — Telephone Encounter (Signed)
Will evaluate at the visit 

## 2019-07-21 NOTE — Assessment & Plan Note (Signed)
With a discrete tender node Started 2 days after COVID vaccine Reassured that this is a common side effect of vaccine Should resolve spontaneously  Discussed aleve and heat

## 2019-07-21 NOTE — Progress Notes (Signed)
 Subjective:    Patient ID: Sabrina [name], female    DOB: 02/23/1956, 63 y.o.   MRN: [id number]  HPI  Here due to left axilla pain--now moving into back of shoulder This visit occurred during the SARS-CoV-2 public health emergency.  Safety protocols were in place, including screening questions prior to the visit, additional usage of staff PPE, and extensive cleaning of exam room while observing appropriate contact time as indicated for disinfecting solutions.   Started 2 days ago Had second COVID in left arm 4 days ago Felt pain along bra line Thought she felt something there Tried 2 aleve--some help Trouble resting in bed---had to keep arm up above her head  No arm swelling May have lump in axilla now  Current Outpatient Medications on File Prior to Visit  Medication Sig Dispense Refill  . furosemide (LASIX) 80 MG tablet Take 1 tablet (80 mg total) by mouth daily as needed. 30 tablet 11  . levothyroxine (SYNTHROID) 50 MCG tablet TAKE 1 TABLET(50 MCG TOTAL) BY MOUTH DAILY 90 tablet 3  . naproxen sodium (ANAPROX) 220 MG tablet Take 1 tablet by mouth 2 (two) times daily as needed.    . omeprazole (PRILOSEC) 20 MG capsule Take 1 capsule (20 mg total) by mouth daily. 90 capsule 3   No current facility-administered medications on file prior to visit.    No Known Allergies  Past Medical History:  Diagnosis Date  . Allergic rhinitis   . Arthritis    Right knee, right elbow  . Complex tear of medial meniscus of right knee 06/26/2016  . Family history of breast cancer    9/19 genetic testing letter sent  . GERD (gastroesophageal reflux disease)   . Hypertension   . Hypothyroidism 2008  . Obstructive sleep apnea    CPAP   --   NOT ABLE TO USE   . PONV (postoperative nausea and vomiting)    after knee surgery  . Viral meningitis 5/08   [facility]  . Wears dentures    partial upper and lower    Past Surgical History:  Procedure Laterality Date  . ABDOMINAL HYSTERECTOMY  1982   Dr  VanDalen assisted by Dr Byrnett  . APPENDECTOMY     teenager  . BREAST BIOPSY Left 03/09/2015   benign/ Dr. Byrnett done in office NEG  . BREAST EXCISIONAL BIOPSY Left 1999   [facility] NEG  . CHONDROPLASTY Right 06/26/2016   Procedure: CHONDROPLASTY;  Surgeon: Joshua Landau, MD;  Location: [facility];  Service: Orthopedics;  Laterality: Right;  . KNEE ARTHROSCOPY W/ LASER  8/07   Left  . KNEE ARTHROSCOPY WITH MEDIAL MENISECTOMY Right 06/26/2016   Procedure: RIGHT KNEE ARTHROSCOPY WITH MEDIAL MENISCECTOMY AND CHONDROPLASTY;  Surgeon: Joshua Landau, MD;  Location: [facility];  Service: Orthopedics;  Laterality: Right;  . MASS EXCISION N/A 02/19/2018   Procedure: EXCISION UPPER LIP MUCOCELE;  Surgeon: Vaught, Creighton, MD;  Location: [facility];  Service: ENT;  Laterality: N/A;  sleep apnea  . TONSILLECTOMY     as child  . VESICOVAGINAL FISTULA CLOSURE W/ TAH  1987   Fibroids    Family History  Problem Relation Age of Onset  . Breast cancer Mother 70       BRCA neg  . Hypertension Father   . Cancer Paternal Uncle        pancreatic cancer  . Hypertension Other        both sides  . Cancer   Other        maternal side  . Prostate cancer Maternal Grandfather   . Breast cancer Maternal Aunt 40  . Breast cancer Maternal Aunt 60  . Coronary artery disease Neg Hx   . Diabetes Neg Hx   . Colon cancer Neg Hx   . Liver cancer Neg Hx     Social History   Socioeconomic History  . Marital status: Married    Spouse name: Not on file  . Number of children: 0  . Years of education: Not on file  . Highest education level: Not on file  Occupational History  . 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     Employer: [employer]  . Occupation: Caregiver --part time    Comment:  independent now  Tobacco Use  . Smoking status: Never Smoker  . Smokeless tobacco: Never Used  Substance and Sexual Activity  . Alcohol use: Yes    Comment: beer on weekends-  occasional  . Drug use: No  . Sexual activity: Yes    Birth control/protection: None  Other Topics Concern  . Not on file  Social History Narrative   From [place].   Moved to [place] in high school then back here.     Social Determinants of Health   Financial Resource Strain:   . Difficulty of Paying Living Expenses:   Food Insecurity:   . Worried About Running Out of Food in the Last Year:   . Ran Out of Food in the Last Year:   Transportation Needs:   . Lack of Transportation (Medical):   . Lack of Transportation (Non-Medical):   Physical Activity:   . Days of Exercise per Week:   . Minutes of Exercise per Session:   Stress:   . Feeling of Stress :   Social Connections:   . Frequency of Communication with Friends and Family:   . Frequency of Social Gatherings with Friends and Family:   . Attends Religious Services:   . Active Member of Clubs or Organizations:   . Attends Club or Organization Meetings:   . Marital Status:   Intimate Partner Violence:   . Fear of Current or Ex-Partner:   . Emotionally Abused:   . Physically Abused:   . Sexually Abused:    Review of Systems  No fever No arm weakness     Objective:   Physical Exam  Constitutional: She appears well-developed. No distress.  Musculoskeletal:     Comments: Left shoulder without swelling No bursa or tendon tenderness Discrete tender axillary node Mild tenderness along pectoralis muscle ROM in shoulder reasonably normal           Assessment & Plan:   

## 2019-10-20 ENCOUNTER — Encounter: Payer: Self-pay | Admitting: Internal Medicine

## 2019-10-20 ENCOUNTER — Ambulatory Visit (INDEPENDENT_AMBULATORY_CARE_PROVIDER_SITE_OTHER): Payer: No Typology Code available for payment source | Admitting: Internal Medicine

## 2019-10-20 ENCOUNTER — Other Ambulatory Visit: Payer: Self-pay

## 2019-10-20 VITALS — BP 150/90 | HR 66 | Ht 64.0 in | Wt 258.0 lb

## 2019-10-20 DIAGNOSIS — Z23 Encounter for immunization: Secondary | ICD-10-CM | POA: Diagnosis not present

## 2019-10-20 DIAGNOSIS — I10 Essential (primary) hypertension: Secondary | ICD-10-CM

## 2019-10-20 DIAGNOSIS — R739 Hyperglycemia, unspecified: Secondary | ICD-10-CM

## 2019-10-20 DIAGNOSIS — R7303 Prediabetes: Secondary | ICD-10-CM | POA: Insufficient documentation

## 2019-10-20 DIAGNOSIS — Z0000 Encounter for general adult medical examination without abnormal findings: Secondary | ICD-10-CM | POA: Diagnosis not present

## 2019-10-20 DIAGNOSIS — I872 Venous insufficiency (chronic) (peripheral): Secondary | ICD-10-CM

## 2019-10-20 DIAGNOSIS — G4733 Obstructive sleep apnea (adult) (pediatric): Secondary | ICD-10-CM

## 2019-10-20 DIAGNOSIS — E039 Hypothyroidism, unspecified: Secondary | ICD-10-CM

## 2019-10-20 DIAGNOSIS — K219 Gastro-esophageal reflux disease without esophagitis: Secondary | ICD-10-CM

## 2019-10-20 MED ORDER — TRIAMTERENE-HCTZ 37.5-25 MG PO TABS: 1.0000 | ORAL_TABLET | Freq: Every day | ORAL | 3 refills | Status: DC

## 2019-10-20 NOTE — Assessment & Plan Note (Signed)
Discussed fitness Colon due 2024 Mammogram yearly--due again in January Had COVID vaccine Recommended flu vaccine--she prefers not Not sure about shingrix---she agrees

## 2019-10-20 NOTE — Assessment & Plan Note (Signed)
Inconsistent with the med

## 2019-10-20 NOTE — Assessment & Plan Note (Signed)
Inconsistent with CPAP

## 2019-10-20 NOTE — Assessment & Plan Note (Signed)
BP Readings from Last 3 Encounters:  10/20/19 (!) 150/90  07/21/19 132/90  12/22/18 132/80   Concerned about her blood pressure Lots of stress etc Will start HCTZ after discussion

## 2019-10-20 NOTE — Assessment & Plan Note (Signed)
Uses lasix rarely

## 2019-10-20 NOTE — Assessment & Plan Note (Signed)
Discussed avoiding all sugar  Will check labs

## 2019-10-20 NOTE — Progress Notes (Signed)
Subjective:    Patient ID: Sabrina [name], female    DOB: [birth year], 64 y.o.   MRN: [id number]  HPI Here for physical This visit occurred during the SARS-CoV-2 public health emergency.  Safety protocols were in place, including screening questions prior to the visit, additional usage of staff PPE, and extensive cleaning of exam room while observing appropriate contact time as indicated for disinfecting solutions.   Still working at [employer] of stress with caring for husband (Rx for lung cancer) Up at night due to his cough and worrying about him Inconsistent with CPAP---sleeps in morning (sometimes sleeps in chair)  Gets some pain under arms and breasts ?stress Node has gone away in left axilla  Inconsistent with levothyroxine Forgets due to "so much going on" Probably only takes twice a week  Furosemide prn Probably only once a month  Current Outpatient Medications on File Prior to Visit  Medication Sig Dispense Refill  . furosemide (LASIX) 80 MG tablet Take 1 tablet (80 mg total) by mouth daily as needed. 30 tablet 11  . levothyroxine (SYNTHROID) 50 MCG tablet TAKE 1 TABLET(50 MCG TOTAL) BY MOUTH DAILY 90 tablet 3  . naproxen sodium (ANAPROX) 220 MG tablet Take 1 tablet by mouth 2 (two) times daily as needed.    Marland Kitchen omeprazole (PRILOSEC) 20 MG capsule Take 1 capsule (20 mg total) by mouth daily. 90 capsule 3   No current facility-administered medications on file prior to visit.    No Known Allergies  Past Medical History:  Diagnosis Date  . Allergic rhinitis   . Arthritis    Right knee, right elbow  . Complex tear of medial meniscus of right knee 06/26/2016  . Family history of breast cancer    9/19 genetic testing letter sent  . GERD (gastroesophageal reflux disease)   . Hypertension   . Hypothyroidism 2008  . Obstructive sleep apnea    CPAP   --   NOT ABLE TO USE   . PONV (postoperative nausea and vomiting)    after knee surgery  . Viral meningitis  5/08   [facility]  . Wears dentures    partial upper and lower    Past Surgical History:  Procedure Laterality Date  . ABDOMINAL HYSTERECTOMY  1982   Dr Vernie Ammons assisted by Dr Bary Castilla  . APPENDECTOMY     teenager  . BREAST BIOPSY Left 03/09/2015   benign/ Dr. Bary Castilla done in office NEG  . BREAST EXCISIONAL BIOPSY Left 1999   [facility] NEG  . CHONDROPLASTY Right 06/26/2016   Procedure: CHONDROPLASTY;  Surgeon: Marchia Bond, MD;  Location: [facility];  Service: Orthopedics;  Laterality: Right;  . KNEE ARTHROSCOPY W/ LASER  8/07   Left  . KNEE ARTHROSCOPY WITH MEDIAL MENISECTOMY Right 06/26/2016   Procedure: RIGHT KNEE ARTHROSCOPY WITH MEDIAL MENISCECTOMY AND CHONDROPLASTY;  Surgeon: Marchia Bond, MD;  Location: [facility];  Service: Orthopedics;  Laterality: Right;  . MASS EXCISION N/A 02/19/2018   Procedure: EXCISION UPPER LIP MUCOCELE;  Surgeon: Carloyn Manner, MD;  Location: [facility];  Service: ENT;  Laterality: N/A;  sleep apnea  . TONSILLECTOMY     as child  . VESICOVAGINAL FISTULA CLOSURE W/ TAH  1987   Fibroids    Family History  Problem Relation Age of Onset  . Breast cancer Mother 80       BRCA neg  . Hypertension Father   . Cancer Paternal Uncle  pancreatic cancer  . Hypertension Other        both sides  . Cancer Other        maternal side  . Prostate cancer Maternal Grandfather   . Breast cancer Maternal Aunt 58  . Breast cancer Maternal Aunt 60  . Coronary artery disease Neg Hx   . Diabetes Neg Hx   . Colon cancer Neg Hx   . Liver cancer Neg Hx     Social History   Socioeconomic History  . Marital status: Married    Spouse name: Not on file  . Number of children: 0  . Years of education: Not on file  . Highest education level: Not on file  Occupational History  . Occupation: [employer] in [employer].     Employer: [employer]  . Occupation: Caregiver --part time    Comment:  independent now  Tobacco  Use  . Smoking status: Never Smoker  . Smokeless tobacco: Never Used  Vaping Use  . Vaping Use: Never used  Substance and Sexual Activity  . Alcohol use: Yes    Comment: beer on weekends- occasional  . Drug use: No  . Sexual activity: Yes    Birth control/protection: None  Other Topics Concern  . Not on file  Social History Narrative   From [place].   Moved to [place] in high school then back here.     Social Determinants of Health   Financial Resource Strain:   . Difficulty of Paying Living Expenses:   Food Insecurity:   . Worried About Charity fundraiser in the Last Year:   . Arboriculturist in the Last Year:   Transportation Needs:   . Film/video editor (Medical):   Marland Kitchen Lack of Transportation (Non-Medical):   Physical Activity:   . Days of Exercise per Week:   . Minutes of Exercise per Session:   Stress:   . Feeling of Stress :   Social Connections:   . Frequency of Communication with Friends and Family:   . Frequency of Social Gatherings with Friends and Family:   . Attends Religious Services:   . Active Member of Clubs or Organizations:   . Attends Archivist Meetings:   Marland Kitchen Marital Status:   Intimate Partner Violence:   . Fear of Current or Ex-Partner:   . Emotionally Abused:   Marland Kitchen Physically Abused:   . Sexually Abused:    Review of Systems  Constitutional: Positive for fatigue.       Weight is up 10# Wears seat belt Walks briefly on break from work--no other exercise  HENT: Negative for dental problem, hearing loss, tinnitus and trouble swallowing.        Keeps up with dentist  Eyes: Negative for visual disturbance.       No diplopia or unilateral vision loss  Respiratory: Negative for cough, chest tightness and shortness of breath.   Cardiovascular: Positive for leg swelling. Negative for chest pain and palpitations.  Gastrointestinal: Negative for abdominal pain, blood in stool and constipation.  Endocrine: Negative for polydipsia and  polyuria.  Genitourinary: Positive for frequency. Negative for dysuria and hematuria.       No sex--no problems  Musculoskeletal: Negative for arthralgias, back pain and joint swelling.  Skin: Negative for rash.       No suspicious skin lesions  Allergic/Immunologic: Positive for environmental allergies. Negative for immunocompromised state.       Uses claritin prn  Neurological: Negative  for dizziness, syncope and light-headedness.       Some headaches--relates to sleep deprivation  Psychiatric/Behavioral: Positive for sleep disturbance. Negative for dysphoric mood.       Stress with husband--no regular anxiety       Objective:   Physical Exam Constitutional:      General: She is not in acute distress.    Appearance: Normal appearance.  HENT:     Head: Normocephalic and atraumatic.     Right Ear: Tympanic membrane and ear canal normal.     Left Ear: Tympanic membrane and ear canal normal.     Mouth/Throat:     Comments: No oral lesions Eyes:     Conjunctiva/sclera: Conjunctivae normal.     Pupils: Pupils are equal, round, and reactive to light.  Cardiovascular:     Rate and Rhythm: Normal rate and regular rhythm.     Pulses: Normal pulses.     Heart sounds: No murmur heard.  No gallop.   Pulmonary:     Effort: Pulmonary effort is normal.     Breath sounds: Normal breath sounds. No wheezing or rales.  Abdominal:     Palpations: Abdomen is soft.     Tenderness: There is no abdominal tenderness.  Musculoskeletal:     Cervical back: Neck supple.     Comments: 2-3+ tense edema in feet/calves--no pitting  Lymphadenopathy:     Cervical: No cervical adenopathy.  Skin:    General: Skin is warm.     Findings: No rash.  Neurological:     Mental Status: She is alert and oriented to person, place, and time.  Psychiatric:        Mood and Affect: Mood normal.        Behavior: Behavior normal.            Assessment & Plan:

## 2019-10-20 NOTE — Assessment & Plan Note (Signed)
PPI intermittently No dysphagia now

## 2019-10-21 LAB — CBC
Hematocrit: 41.8 % (ref 34.0–46.6)
Hemoglobin: 14 g/dL (ref 11.1–15.9)
MCH: 30.2 pg (ref 26.6–33.0)
MCHC: 33.5 g/dL (ref 31.5–35.7)
MCV: 90 fL (ref 79–97)
Platelets: 261 10*3/uL (ref 150–450)
RBC: 4.63 x10E6/uL (ref 3.77–5.28)
RDW: 12.9 % (ref 11.7–15.4)
WBC: 8 10*3/uL (ref 3.4–10.8)

## 2019-10-21 LAB — COMPREHENSIVE METABOLIC PANEL
ALT: 14 IU/L (ref 0–32)
AST: 14 IU/L (ref 0–40)
Albumin/Globulin Ratio: 1.6 (ref 1.2–2.2)
Albumin: 4.2 g/dL (ref 3.8–4.8)
Alkaline Phosphatase: 73 IU/L (ref 48–121)
BUN/Creatinine Ratio: 15 (ref 12–28)
BUN: 11 mg/dL (ref 8–27)
Bilirubin Total: 0.5 mg/dL (ref 0.0–1.2)
CO2: 24 mmol/L (ref 20–29)
Calcium: 9.2 mg/dL (ref 8.7–10.3)
Chloride: 104 mmol/L (ref 96–106)
Creatinine, Ser: 0.75 mg/dL (ref 0.57–1.00)
GFR calc Af Amer: 99 mL/min/{1.73_m2} (ref >59)
GFR calc non Af Amer: 86 mL/min/{1.73_m2} (ref >59)
Globulin, Total: 2.6 g/dL (ref 1.5–4.5)
Glucose: 111 mg/dL — ABNORMAL HIGH (ref 65–99)
Potassium: 4.7 mmol/L (ref 3.5–5.2)
Sodium: 141 mmol/L (ref 134–144)
Total Protein: 6.8 g/dL (ref 6.0–8.5)

## 2019-10-21 LAB — T4, FREE: Free T4: 0.98 ng/dL (ref 0.82–1.77)

## 2019-10-21 LAB — TSH: TSH: 3.5 u[IU]/mL (ref 0.450–4.500)

## 2019-10-21 LAB — HEMOGLOBIN A1C
Est. average glucose Bld gHb Est-mCnc: 131 mg/dL
Hgb A1c MFr Bld: 6.2 % — ABNORMAL HIGH (ref 4.8–5.6)

## 2019-12-22 ENCOUNTER — Other Ambulatory Visit: Payer: Self-pay

## 2019-12-22 ENCOUNTER — Ambulatory Visit: Payer: No Typology Code available for payment source | Admitting: Internal Medicine

## 2019-12-22 ENCOUNTER — Encounter: Payer: Self-pay | Admitting: Internal Medicine

## 2019-12-22 VITALS — BP 130/84 | HR 84 | Temp 97.9°F | Ht 64.0 in | Wt 255.0 lb

## 2019-12-22 DIAGNOSIS — I10 Essential (primary) hypertension: Secondary | ICD-10-CM

## 2019-12-22 DIAGNOSIS — E039 Hypothyroidism, unspecified: Secondary | ICD-10-CM

## 2019-12-22 NOTE — Assessment & Plan Note (Signed)
Will recheck free T4 off the medication still

## 2019-12-22 NOTE — Assessment & Plan Note (Signed)
BP Readings from Last 3 Encounters:  12/22/19 130/84  10/20/19 (!) 150/90  07/21/19 132/90   Better now on the HCTZ Taking it in AM now--- before sleep (better for her) Discussed her husband's BP cuff---not sure it is big enough for her arm Will check renal profile

## 2019-12-22 NOTE — Progress Notes (Signed)
Subjective:    Patient ID: Sabrina [name], female    DOB: [birth year], 64 y.o.   MRN: [id number]  HPI Here for follow up of HTN This visit occurred during the SARS-CoV-2 public health emergency.  Safety protocols were in place, including screening questions prior to the visit, additional usage of staff PPE, and extensive cleaning of exam room while observing appropriate contact time as indicated for disinfecting solutions.   No problems with the medication Did try her husband's BP cuff once when she didn't feel right---felt woozy (this was at night--when she had been taking the medication---works 3rd shift) Now taking in the morning--doesn't wake her from sleep  No chest pain No SOB  Current Outpatient Medications on File Prior to Visit  Medication Sig Dispense Refill  . furosemide (LASIX) 80 MG tablet Take 1 tablet (80 mg total) by mouth daily as needed. 30 tablet 11  . levothyroxine (SYNTHROID) 50 MCG tablet TAKE 1 TABLET(50 MCG TOTAL) BY MOUTH DAILY 90 tablet 3  . naproxen sodium (ANAPROX) 220 MG tablet Take 1 tablet by mouth 2 (two) times daily as needed.    Marland Kitchen omeprazole (PRILOSEC) 20 MG capsule Take 1 capsule (20 mg total) by mouth daily. 90 capsule 3  . triamterene-hydrochlorothiazide (MAXZIDE-25) 37.5-25 MG tablet Take 1 tablet by mouth daily. 90 tablet 3   No current facility-administered medications on file prior to visit.    No Known Allergies  Past Medical History:  Diagnosis Date  . Allergic rhinitis   . Arthritis    Right knee, right elbow  . Complex tear of medial meniscus of right knee 06/26/2016  . Family history of breast cancer    9/19 genetic testing letter sent  . GERD (gastroesophageal reflux disease)   . Hypertension   . Hypothyroidism 2008  . Obstructive sleep apnea    CPAP   --   NOT ABLE TO USE   . PONV (postoperative nausea and vomiting)    after knee surgery  . Viral meningitis 5/08   [facility]  . Wears dentures    partial upper and lower    Past  Surgical History:  Procedure Laterality Date  . ABDOMINAL HYSTERECTOMY  1982   Dr Vernie Ammons assisted by Dr Bary Castilla  . APPENDECTOMY     teenager  . BREAST BIOPSY Left 03/09/2015   benign/ Dr. Bary Castilla done in office NEG  . BREAST EXCISIONAL BIOPSY Left 1999   [facility] NEG  . CHONDROPLASTY Right 06/26/2016   Procedure: CHONDROPLASTY;  Surgeon: Marchia Bond, MD;  Location: [facility];  Service: Orthopedics;  Laterality: Right;  . KNEE ARTHROSCOPY W/ LASER  8/07   Left  . KNEE ARTHROSCOPY WITH MEDIAL MENISECTOMY Right 06/26/2016   Procedure: RIGHT KNEE ARTHROSCOPY WITH MEDIAL MENISCECTOMY AND CHONDROPLASTY;  Surgeon: Marchia Bond, MD;  Location: [place];  Service: Orthopedics;  Laterality: Right;  . MASS EXCISION N/A 02/19/2018   Procedure: EXCISION UPPER LIP MUCOCELE;  Surgeon: Carloyn Manner, MD;  Location: [place];  Service: ENT;  Laterality: N/A;  sleep apnea  . TONSILLECTOMY     as child  . VESICOVAGINAL FISTULA CLOSURE W/ TAH  1987   Fibroids    Family History  Problem Relation Age of Onset  . Breast cancer Mother 58       BRCA neg  . Hypertension Father   . Cancer Paternal Uncle        pancreatic cancer  . Hypertension Other  both sides  . Cancer Other        maternal side  . Prostate cancer Maternal Grandfather   . Breast cancer Maternal Aunt 47  . Breast cancer Maternal Aunt 60  . Coronary artery disease Neg Hx   . Diabetes Neg Hx   . Colon cancer Neg Hx   . Liver cancer Neg Hx     Social History   Socioeconomic History  . Marital status: Married    Spouse name: Not on file  . Number of children: 0  . Years of education: Not on file  . Highest education level: Not on file  Occupational History  . Occupation: [employer] in [employer].     Employer: [employer]  . Occupation:      Comment:    Tobacco Use  . Smoking status: Never Smoker  . Smokeless tobacco: Never Used  Vaping Use  . Vaping Use: Never  used  Substance and Sexual Activity  . Alcohol use: Yes    Comment: beer on weekends- occasional  . Drug use: No  . Sexual activity: Yes    Birth control/protection: None  Other Topics Concern  . Not on file  Social History Narrative   From [place].   Moved to [place] in high school then back here.     Social Determinants of Health   Financial Resource Strain:   . Difficulty of Paying Living Expenses: Not on file  Food Insecurity:   . Worried About Charity fundraiser in the Last Year: Not on file  . Ran Out of Food in the Last Year: Not on file  Transportation Needs:   . Lack of Transportation (Medical): Not on file  . Lack of Transportation (Non-Medical): Not on file  Physical Activity:   . Days of Exercise per Week: Not on file  . Minutes of Exercise per Session: Not on file  Stress:   . Feeling of Stress : Not on file  Social Connections:   . Frequency of Communication with Friends and Family: Not on file  . Frequency of Social Gatherings with Friends and Family: Not on file  . Attends Religious Services: Not on file  . Active Member of Clubs or Organizations: Not on file  . Attends Archivist Meetings: Not on file  . Marital Status: Not on file  Intimate Partner Violence:   . Fear of Current or Ex-Partner: Not on file  . Emotionally Abused: Not on file  . Physically Abused: Not on file  . Sexually Abused: Not on file   Review of Systems Sleeps fair---in AM after coming home from work Monitors husband so sleep is broken up Appetite is okay Weight down slightly    Objective:   Physical Exam Constitutional:      Appearance: Normal appearance.  Cardiovascular:     Rate and Rhythm: Normal rate and regular rhythm.     Heart sounds: No murmur heard.  No gallop.   Pulmonary:     Effort: Pulmonary effort is normal.     Breath sounds: Normal breath sounds. No wheezing or rales.  Musculoskeletal:     Comments: Thick calves without pitting  Neurological:       Mental Status: She is alert.  Psychiatric:        Mood and Affect: Mood normal.        Behavior: Behavior normal.            Assessment & Plan:

## 2019-12-23 ENCOUNTER — Telehealth: Payer: Self-pay | Admitting: Internal Medicine

## 2019-12-23 NOTE — Telephone Encounter (Signed)
Please advise 

## 2019-12-23 NOTE — Telephone Encounter (Signed)
That is fine If she is negative today---she probably did not catch it

## 2019-12-23 NOTE — Telephone Encounter (Signed)
Pt called stating she had appointment yesterday 8/31 with dr Silvio Pate.  She stated she was just informed that a friend of hers tested positive for covid the friend got results yesterday or this morning.  Ms Stonerock stated she was around her friend on Friday 8/27 and Monday 8/30  Pt stated on Friday they both had mask on.  On Monday neither one had mask they were at pt home.  Pt stated she does have headache now but she thinks this is from stress over covid   Pt stated her husband has several health issues she wants to be tested before the 5 day recommendations   Please advise pt when she needs to be tested. ASAP

## 2019-12-23 NOTE — Telephone Encounter (Signed)
For your information  

## 2019-12-23 NOTE — Telephone Encounter (Signed)
With the delta variant, it is reasonable to check COVID testing 3 days after the last exposure (so that would be tomorrow).

## 2019-12-23 NOTE — Telephone Encounter (Signed)
Patient informed and verbalized understanding.  States that due to work she will have to be tested today instead. Patient is on her way to be tested now.

## 2019-12-24 LAB — RENAL FUNCTION PANEL
Albumin: 4.3 g/dL (ref 3.8–4.8)
BUN/Creatinine Ratio: 22 (ref 12–28)
BUN: 20 mg/dL (ref 8–27)
CO2: 24 mmol/L (ref 20–29)
Calcium: 9.6 mg/dL (ref 8.7–10.3)
Chloride: 105 mmol/L (ref 96–106)
Creatinine, Ser: 0.9 mg/dL (ref 0.57–1.00)
GFR calc Af Amer: 79 mL/min/{1.73_m2} (ref >59)
GFR calc non Af Amer: 69 mL/min/{1.73_m2} (ref >59)
Glucose: 79 mg/dL (ref 65–99)
Phosphorus: 4 mg/dL (ref 3.0–4.3)
Potassium: 4.4 mmol/L (ref 3.5–5.2)
Sodium: 143 mmol/L (ref 134–144)

## 2019-12-24 LAB — T4, FREE: Free T4: 1.02 ng/dL (ref 0.82–1.77)

## 2020-01-11 ENCOUNTER — Encounter: Payer: Self-pay | Admitting: Obstetrics & Gynecology

## 2020-01-11 ENCOUNTER — Other Ambulatory Visit: Payer: Self-pay

## 2020-01-11 ENCOUNTER — Ambulatory Visit (INDEPENDENT_AMBULATORY_CARE_PROVIDER_SITE_OTHER): Payer: No Typology Code available for payment source | Admitting: Obstetrics & Gynecology

## 2020-01-11 VITALS — BP 130/80 | Ht 64.0 in | Wt 255.0 lb

## 2020-01-11 DIAGNOSIS — Z1211 Encounter for screening for malignant neoplasm of colon: Secondary | ICD-10-CM | POA: Diagnosis not present

## 2020-01-11 DIAGNOSIS — Z1231 Encounter for screening mammogram for malignant neoplasm of breast: Secondary | ICD-10-CM | POA: Diagnosis not present

## 2020-01-11 DIAGNOSIS — Z01419 Encounter for gynecological examination (general) (routine) without abnormal findings: Secondary | ICD-10-CM

## 2020-01-11 MED ORDER — CLOTRIMAZOLE-BETAMETHASONE 1-0.05 % EX CREA: 1.0000 "application " | TOPICAL_CREAM | Freq: Two times a day (BID) | CUTANEOUS | 0 refills | Status: DC

## 2020-01-11 NOTE — Patient Instructions (Signed)
PAP every three years Mammogram every year    Call [phone] to schedule at [facility] Colonoscopy every 10 years Labs yearly (with PCP)  Thank you for choosing [facility]. As part of our ongoing efforts to improve patient experience, we would appreciate your feedback. Please fill out the short survey that you will receive by mail or MyChart. Your opinion is important to [place]! - Dr. Kenton Kingfisher

## 2020-01-11 NOTE — Progress Notes (Signed)
HPI:      [name] is a 64 y.o. G0P0000 who LMP was in the past, she presents today for her annual examination.  The patient has no complaints today other than Breast T bilat for the last few mos; no mass, skin changes.. The patient is not currently sexually active. Herlast pap: approximate date 2020 and was normal and last mammogram: approximate date 2021 and was normal.  The patient does perform self breast exams.  There is no notable family history of breast or ovarian cancer in her family. The patient is not taking hormone replacement therapy. Patient denies post-menopausal vaginal bleeding.   The patient has regular exercise: yes. The patient denies current symptoms of depression.    GYN Hx: Last Colonoscopy:3 years ago. Normal.  Last DEXA: never ago.    PMHx: Past Medical History:  Diagnosis Date  . Allergic rhinitis   . Arthritis    Right knee, right elbow  . Complex tear of medial meniscus of right knee 06/26/2016  . Family history of breast cancer    9/19 genetic testing letter sent  . GERD (gastroesophageal reflux disease)   . Hypertension   . Hypothyroidism 2008  . Obstructive sleep apnea    CPAP   --   NOT ABLE TO USE   . PONV (postoperative nausea and vomiting)    after knee surgery  . Viral meningitis 5/08   [facility]  . Wears dentures    partial upper and lower   Past Surgical History:  Procedure Laterality Date  . ABDOMINAL HYSTERECTOMY  1982   Dr Vernie Ammons assisted by Dr Bary Castilla  . APPENDECTOMY     teenager  . BREAST BIOPSY Left 03/09/2015   benign/ Dr. Bary Castilla done in office NEG  . BREAST EXCISIONAL BIOPSY Left 1999   [facility] NEG  . CHONDROPLASTY Right 06/26/2016   Procedure: CHONDROPLASTY;  Surgeon: Marchia Bond, MD;  Location: [place];  Service: Orthopedics;  Laterality: Right;  . KNEE ARTHROSCOPY W/ LASER  8/07   Left  . KNEE ARTHROSCOPY WITH MEDIAL MENISECTOMY Right 06/26/2016   Procedure: RIGHT KNEE ARTHROSCOPY WITH MEDIAL  MENISCECTOMY AND CHONDROPLASTY;  Surgeon: Marchia Bond, MD;  Location: [facility];  Service: Orthopedics;  Laterality: Right;  . MASS EXCISION N/A 02/19/2018   Procedure: EXCISION UPPER LIP MUCOCELE;  Surgeon: Carloyn Manner, MD;  Location: [place];  Service: ENT;  Laterality: N/A;  sleep apnea  . TONSILLECTOMY     as child  . VESICOVAGINAL FISTULA CLOSURE W/ TAH  1987   Fibroids   Family History  Problem Relation Age of Onset  . Breast cancer Mother 16       BRCA neg  . Hypertension Father   . Cancer Paternal Uncle        pancreatic cancer  . Hypertension Other        both sides  . Cancer Other        maternal side  . Prostate cancer Maternal Grandfather   . Breast cancer Maternal Aunt 2  . Breast cancer Maternal Aunt 60  . Coronary artery disease Neg Hx   . Diabetes Neg Hx   . Colon cancer Neg Hx   . Liver cancer Neg Hx    Social History   Tobacco Use  . Smoking status: Never Smoker  . Smokeless tobacco: Never Used  Vaping Use  . Vaping Use: Never used  Substance Use Topics  . Alcohol use: Yes  Comment: beer on weekends- occasional  . Drug use: No    Current Outpatient Medications:  .  furosemide (LASIX) 80 MG tablet, Take 1 tablet (80 mg total) by mouth daily as needed., Disp: 30 tablet, Rfl: 11 .  naproxen sodium (ANAPROX) 220 MG tablet, Take 1 tablet by mouth 2 (two) times daily as needed., Disp: , Rfl:  .  omeprazole (PRILOSEC) 20 MG capsule, Take 1 capsule (20 mg total) by mouth daily., Disp: 90 capsule, Rfl: 3 .  triamterene-hydrochlorothiazide (MAXZIDE-25) 37.5-25 MG tablet, Take 1 tablet by mouth daily., Disp: 90 tablet, Rfl: 3 Allergies: Patient has no known allergies.  Review of Systems  Constitutional: Negative for chills, fever and malaise/fatigue.  HENT: Negative for congestion, sinus pain and sore throat.   Eyes: Negative for blurred vision and pain.  Respiratory: Negative for cough and wheezing.   Cardiovascular:  Negative for chest pain and leg swelling.  Gastrointestinal: Negative for abdominal pain, constipation, diarrhea, heartburn, nausea and vomiting.  Genitourinary: Negative for dysuria, frequency, hematuria and urgency.  Musculoskeletal: Negative for back pain, joint pain, myalgias and neck pain.  Skin: Negative for itching and rash.  Neurological: Negative for dizziness, tremors and weakness.  Endo/Heme/Allergies: Does not bruise/bleed easily.  Psychiatric/Behavioral: Negative for depression. The patient is not nervous/anxious and does not have insomnia.     Objective: BP 130/80   Ht _0  (1.626 m)   Wt 255 lb (115.7 kg)   BMI 43.77 kg/m   Filed Weights   01/11/20 0957  Weight: 255 lb (115.7 kg)   Body mass index is 43.77 kg/m. Physical Exam Constitutional:      General: She is not in acute distress.    Appearance: She is well-developed.  Genitourinary:     Pelvic exam was performed with patient supine.     Vulva, urethra, bladder, vagina and rectum normal.     No lesions in the vagina.     No vaginal bleeding.     Cervix is absent.     Uterus is absent.     No right or left adnexal mass present.     Right adnexa not tender.     Left adnexa not tender.     Genitourinary Comments: Vaginal cuff well healed  HENT:     Head: Normocephalic and atraumatic. No laceration.     Right Ear: Hearing normal.     Left Ear: Hearing normal.     Mouth/Throat:     Pharynx: Uvula midline.  Eyes:     Pupils: Pupils are equal, round, and reactive to light.  Neck:     Thyroid: No thyromegaly.  Cardiovascular:     Rate and Rhythm: Normal rate and regular rhythm.     Heart sounds: No murmur heard.  No friction rub. No gallop.   Pulmonary:     Effort: Pulmonary effort is normal. No respiratory distress.     Breath sounds: Normal breath sounds. No wheezing.  Chest:     Breasts:        Right: No mass, skin change or tenderness.        Left: No mass, skin change or tenderness.    Abdominal:     General: Bowel sounds are normal. There is no distension.     Palpations: Abdomen is soft.     Tenderness: There is no abdominal tenderness. There is no rebound.  Musculoskeletal:        General: Normal range of motion.     Cervical back:  Normal range of motion and neck supple.  Neurological:     Mental Status: She is alert and oriented to person, place, and time.     Cranial Nerves: No cranial nerve deficit.  Skin:    General: Skin is warm and dry.  Psychiatric:        Judgment: Judgment normal.  Vitals reviewed.     Assessment: Annual Exam 1. Women's annual routine gynecological examination   2. Encounter for screening mammogram for malignant neoplasm of breast   3. Screen for colon cancer     Plan:            1.  Cervical Screening-  Pap smear schedule reviewed with patient  2. Breast screening- Exam annually and mammogram scheduled  3. Colonoscopy every 10 years, Hemoccult testing after age 97  4. Labs managed by PCP  5. Counseling for hormonal therapy: none              6. FRAX - FRAX score for assessing the 10 year probability for fracture calculated and discussed today.  Based on age and score today, DEXA is not currently scheduled.    F/U  Return in about 1 year (around 01/10/2021) for Annual.  Barnett Applebaum, MD, [facility], [facility] 01/11/2020  10:15 AM

## 2020-01-14 ENCOUNTER — Encounter: Payer: Self-pay | Admitting: Obstetrics and Gynecology

## 2020-01-19 ENCOUNTER — Ambulatory Visit: Payer: No Typology Code available for payment source | Attending: Internal Medicine

## 2020-01-19 DIAGNOSIS — Z23 Encounter for immunization: Secondary | ICD-10-CM

## 2020-01-19 NOTE — Progress Notes (Signed)
   Covid-19 Vaccination Clinic  Name:  Sabrina [name]    MRN: [id number] DOB: [birth year]  01/19/2020  Sabrina [name] was observed post Covid-19 immunization for 15 minutes without incident. She was provided with Vaccine Information Sheet and instruction to access the V-Safe system.   Sabrina [name] was instructed to call 911 with any severe reactions post vaccine: Marland Kitchen Difficulty breathing  . Swelling of face and throat  . A fast heartbeat  . A bad rash all over body  . Dizziness and weakness

## 2020-02-08 ENCOUNTER — Other Ambulatory Visit: Payer: Self-pay

## 2020-02-08 ENCOUNTER — Encounter: Payer: Self-pay | Admitting: Dermatology

## 2020-02-08 ENCOUNTER — Ambulatory Visit: Payer: No Typology Code available for payment source | Admitting: Dermatology

## 2020-02-08 DIAGNOSIS — L209 Atopic dermatitis, unspecified: Secondary | ICD-10-CM | POA: Diagnosis not present

## 2020-02-08 DIAGNOSIS — R21 Rash and other nonspecific skin eruption: Secondary | ICD-10-CM

## 2020-02-08 MED ORDER — VALACYCLOVIR HCL 1 G PO TABS: 1000.0000 mg | ORAL_TABLET | Freq: Three times a day (TID) | ORAL | 0 refills | Status: AC

## 2020-02-08 MED ORDER — EUCRISA 2 % EX OINT: TOPICAL_OINTMENT | CUTANEOUS | 1 refills | Status: DC

## 2020-02-08 NOTE — Patient Instructions (Signed)
Recommend daily broad spectrum sunscreen SPF 30+ to sun-exposed areas, reapply every 2 hours as needed. Call for new or changing lesions.  

## 2020-02-08 NOTE — Progress Notes (Signed)
   Follow-Up Visit   Subjective  Sabrina [name] is a 64 y.o. female who presents for the following: Rash (B/L Arms started a month or so ago, has been using Xolgel, which has helped) and Area of concern (Left buttock, patient thinks it may be shingles, has been using OTC HC 10).  Patient was last seen in office on 06/29/19, documented on [employer].  The following portions of the chart were reviewed this encounter and updated as appropriate:  Tobacco  Allergies  Meds  Problems  Med Hx  Surg Hx  Fam Hx     Review of Systems:  No other skin or systemic complaints except as noted in HPI or Assessment and Plan.  Objective  Well appearing patient in no apparent distress; mood and affect are within normal limits.  A focused examination was performed including B/L arm, and L. Buttock. Relevant physical exam findings are noted in the Assessment and Plan.  Objective  Left Forearm - Anterior: Scaly erythematous papules and patches +/- dyspigmentation, lichenification, excoriations.   Objective  Left Hip (side) - Posterior: Weeping patch   Assessment & Plan  Atopic dermatitis, unspecified type Left Forearm - Anterior  Start Eucrisa apply to affected area once to twice daily as needed for rash.  Sample of Eucrisa given  Crisaborole (EUCRISA) 2 % OINT - Left Forearm - Anterior  Rash = Herpes Simplex vs Shingles Left Hip (side) - Posterior Weeping erosions HSV vs Shingles - May recur if HSV. Plan culture if recurs. Start Valtrex 1 gram tab three times daily for 2 weeks.keep covered  valACYclovir (VALTREX) 1000 MG tablet - Left Hip (side) - Posterior  Return in about 1 month (around 03/10/2020) for rash.  Marene Lenz, CMA, am acting as scribe for Sarina Ser, MD  Documentation: I have reviewed the above documentation for accuracy and completeness, and I agree with the above.  Sarina Ser, MD

## 2020-02-27 ENCOUNTER — Emergency Department: Payer: No Typology Code available for payment source

## 2020-02-27 ENCOUNTER — Encounter: Payer: Self-pay | Admitting: Emergency Medicine

## 2020-02-27 ENCOUNTER — Other Ambulatory Visit: Payer: Self-pay

## 2020-02-27 ENCOUNTER — Emergency Department
Admission: EM | Admit: 2020-02-27 | Discharge: 2020-02-27 | Disposition: A | Discharge status: Home / Self Care | Payer: No Typology Code available for payment source | Attending: Emergency Medicine | Admitting: Emergency Medicine

## 2020-02-27 DIAGNOSIS — Z8249 Family history of ischemic heart disease and other diseases of the circulatory system: Secondary | ICD-10-CM | POA: Diagnosis not present

## 2020-02-27 DIAGNOSIS — R519 Headache, unspecified: Secondary | ICD-10-CM

## 2020-02-27 DIAGNOSIS — H53149 Visual discomfort, unspecified: Secondary | ICD-10-CM | POA: Insufficient documentation

## 2020-02-27 DIAGNOSIS — I10 Essential (primary) hypertension: Secondary | ICD-10-CM | POA: Diagnosis not present

## 2020-02-27 DIAGNOSIS — Z20822 Contact with and (suspected) exposure to covid-19: Secondary | ICD-10-CM | POA: Insufficient documentation

## 2020-02-27 DIAGNOSIS — I6782 Cerebral ischemia: Secondary | ICD-10-CM | POA: Diagnosis not present

## 2020-02-27 DIAGNOSIS — E039 Hypothyroidism, unspecified: Secondary | ICD-10-CM | POA: Diagnosis not present

## 2020-02-27 DIAGNOSIS — Z79899 Other long term (current) drug therapy: Secondary | ICD-10-CM | POA: Insufficient documentation

## 2020-02-27 DIAGNOSIS — Z8673 Personal history of transient ischemic attack (TIA), and cerebral infarction without residual deficits: Secondary | ICD-10-CM | POA: Insufficient documentation

## 2020-02-27 DIAGNOSIS — R42 Dizziness and giddiness: Secondary | ICD-10-CM

## 2020-02-27 DIAGNOSIS — F419 Anxiety disorder, unspecified: Secondary | ICD-10-CM

## 2020-02-27 DIAGNOSIS — R112 Nausea with vomiting, unspecified: Secondary | ICD-10-CM | POA: Diagnosis not present

## 2020-02-27 LAB — DIFFERENTIAL
Abs Immature Granulocytes: 0.03 10*3/uL (ref 0.00–0.07)
Basophils Absolute: 0.1 10*3/uL (ref 0.0–0.1)
Basophils Relative: 0 %
Eosinophils Absolute: 0.2 10*3/uL (ref 0.0–0.5)
Eosinophils Relative: 2 %
Immature Granulocytes: 0 %
Lymphocytes Relative: 31 %
Lymphs Abs: 3.6 10*3/uL (ref 0.7–4.0)
Monocytes Absolute: 0.6 10*3/uL (ref 0.1–1.0)
Monocytes Relative: 5 %
Neutro Abs: 7.1 10*3/uL (ref 1.7–7.7)
Neutrophils Relative %: 62 %

## 2020-02-27 LAB — CBC
HCT: 38.9 % (ref 36.0–46.0)
Hemoglobin: 13 g/dL (ref 12.0–15.0)
MCH: 31.1 pg (ref 26.0–34.0)
MCHC: 33.4 g/dL (ref 30.0–36.0)
MCV: 93.1 fL (ref 80.0–100.0)
Platelets: 281 10*3/uL (ref 150–400)
RBC: 4.18 MIL/uL (ref 3.87–5.11)
RDW: 13.3 % (ref 11.5–15.5)
WBC: 11.6 10*3/uL — ABNORMAL HIGH (ref 4.0–10.5)
nRBC: 0 % (ref 0.0–0.2)

## 2020-02-27 LAB — COMPREHENSIVE METABOLIC PANEL
ALT: 15 U/L (ref 0–44)
AST: 17 U/L (ref 15–41)
Albumin: 4.4 g/dL (ref 3.5–5.0)
Alkaline Phosphatase: 64 U/L (ref 38–126)
Anion gap: 11 (ref 5–15)
BUN: 16 mg/dL (ref 8–23)
CO2: 28 mmol/L (ref 22–32)
Calcium: 9 mg/dL (ref 8.9–10.3)
Chloride: 100 mmol/L (ref 98–111)
Creatinine, Ser: 0.83 mg/dL (ref 0.44–1.00)
GFR, Estimated: >60 mL/min (ref >60)
Glucose, Bld: 143 mg/dL — ABNORMAL HIGH (ref 70–99)
Potassium: 3.7 mmol/L (ref 3.5–5.1)
Sodium: 139 mmol/L (ref 135–145)
Total Bilirubin: 0.8 mg/dL (ref 0.3–1.2)
Total Protein: 7.8 g/dL (ref 6.5–8.1)

## 2020-02-27 LAB — RESPIRATORY PANEL BY RT PCR (FLU A&B, COVID)
Influenza A by PCR: NEGATIVE
Influenza B by PCR: NEGATIVE
SARS Coronavirus 2 by RT PCR: NEGATIVE

## 2020-02-27 LAB — APTT: aPTT: 29 seconds (ref 24–36)

## 2020-02-27 LAB — BRAIN NATRIURETIC PEPTIDE: B Natriuretic Peptide: 35.6 pg/mL (ref 0.0–100.0)

## 2020-02-27 LAB — TROPONIN I (HIGH SENSITIVITY): Troponin I (High Sensitivity): 2 ng/L (ref <18)

## 2020-02-27 LAB — PROTIME-INR
INR: 1 (ref 0.8–1.2)
Prothrombin Time: 12.5 seconds (ref 11.4–15.2)

## 2020-02-27 LAB — MAGNESIUM: Magnesium: 2.1 mg/dL (ref 1.7–2.4)

## 2020-02-27 LAB — CBG MONITORING, ED: Glucose-Capillary: 108 mg/dL — ABNORMAL HIGH (ref 70–99)

## 2020-02-27 MED ORDER — SODIUM CHLORIDE 0.9% FLUSH
3.0000 mL | Freq: Once | INTRAVENOUS | Status: DC
Start: 2020-02-27 — End: 2020-02-27

## 2020-02-27 MED ORDER — ONDANSETRON HCL 4 MG/2ML IJ SOLN
4.0000 mg | Freq: Once | INTRAMUSCULAR | Status: AC
  Administered 2020-02-27: 4 mg via INTRAVENOUS
  Filled 2020-02-27: qty 2

## 2020-02-27 MED ORDER — IOHEXOL 350 MG/ML SOLN
100.0000 mL | Freq: Once | INTRAVENOUS | Status: AC | PRN
  Administered 2020-02-27: 100 mL via INTRAVENOUS

## 2020-02-27 MED ORDER — LACTATED RINGERS IV BOLUS
500.0000 mL | Freq: Once | INTRAVENOUS | Status: AC
  Administered 2020-02-27: 500 mL via INTRAVENOUS

## 2020-02-27 MED ORDER — PROCHLORPERAZINE EDISYLATE 10 MG/2ML IJ SOLN
10.0000 mg | Freq: Once | INTRAMUSCULAR | Status: AC
  Administered 2020-02-27: 10 mg via INTRAVENOUS
  Filled 2020-02-27: qty 2

## 2020-02-27 MED ORDER — DIAZEPAM 5 MG/ML IJ SOLN
2.5000 mg | Freq: Once | INTRAMUSCULAR | Status: AC
  Administered 2020-02-27: 2.5 mg via INTRAVENOUS
  Filled 2020-02-27: qty 2

## 2020-02-27 NOTE — ED Notes (Signed)
Pt returned from CT with this RN at this time.

## 2020-02-27 NOTE — ED Triage Notes (Signed)
Pt in via EMS from work. EMS reports pt with sudden onset of NV and headache. Last ate an hour ago. No weakness noted, negative stroke screen. Pt has vomited x's 2. Nausea has subsided.   Initial 181/94, #20 g to left AC, pt was given 4 mg of zofran FSBS 140, HR 72, 99% RA, 133/67. Pt with hx of HTN and acid reflux

## 2020-02-27 NOTE — ED Notes (Signed)
Dr. Tamala Julian cancelled Code Stroke.

## 2020-02-27 NOTE — Progress Notes (Signed)
[place] paged for CODE STROKE; met pt. and medical team in CT scan; pt. tearful, nauseous, appearing anxious as team prepared her for scan.  [place] and CT Tech provided support and comfort at bedside to help reassure pt. medical team are going to take good care of her and that she is in a safe place; per pt.'s own crying out in prayer, [place] joined w/pt. in praying for sense of divine presence, comfort, and peace in the midst of pt.'s current situation.  [facility] followed pt. back to rm. 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  Sister also shared pt. lost her father two months ago; per sister, pt. has exhibited nausea, shaking, symptoms of anxirty when under stress/grief in the past.  Pt. given meds to help w/nausea/anxiety --> asleep when [facility] excused himself w/sister at bedside.  [facility] remains available as needed.

## 2020-02-27 NOTE — Consult Note (Addendum)
Neurology Consultation  Reason for Consult: Code stroke, dizziness Referring Physician: Dr. Hulan Saas, EDP  CC: dizziness  History is obtained from:patient and chart  HPI: Sabrina [name] is a 64 y.o. female PMH of HTN, GERD, OSA, hypothyroidism, presented from her place of work for symptoms of sudden onset of dizziness and nausea along with vomiting.  She reports that she works third shift at [employer] and she was last normal at 0400 hrs. on 02/27/2020.  At around that time, she started feeling somewhat unwell-just a generalized feeling of not feeling well.  At around 6 AM she had a sudden onset of dizziness with she felt that she felt woozy.  Could not describe clearly if she felt room spinning or imbalance or any specific feeling. She was brought into the emergency room at [facility] and a code stroke was activated for concern for posterior circulation stroke given her symptoms. Her blood pressures were in the systolic 376. Fingerstick glucose was normal Taken for stat CT head with no acute changes. CTA head and neck also not suggestive of any emergent large vessel occlusion and had patent basilar. Decision made not to use TPA because she was outside the window at the time of the code stroke. Exam was not consistent with an LVO and the CTA did not show any LVO making her not a candidate for EVT. Her sister arrived later on, who I spoke with in the presence of the chaplain as well who is assisting with a code stroke.  The sister reports that the patient has been under tremendous amounts of stress-with a sick husband, recent passing of mother and multiple other stressors.  Recently had a funeral, she had a stress response where she started becoming dizzy and started throwing up and had to be taken home.  She has had multiple episodes of dizziness and nausea and vomiting whenever she gets extremely anxious.  LKW: 0400 hrs. on 02/27/2020 tpa given?: no, outside the window at the  time of presentation Premorbid modified Rankin scale (mRS): 0   ROS: Performed and negative except as noted in HPI  Past Medical History:  Diagnosis Date   Allergic rhinitis    Arthritis    Right knee, right elbow   Complex tear of medial meniscus of right knee 06/26/2016   Family history of breast cancer    9/19 and 9/21 genetic testing letter sent   GERD (gastroesophageal reflux disease)    Hypertension    Hypothyroidism 2008   Obstructive sleep apnea    CPAP   --   NOT ABLE TO USE    PONV (postoperative nausea and vomiting)    after knee surgery   Viral meningitis 5/08   [facility]   Wears dentures    partial upper and lower   Family History  Problem Relation Age of Onset   Breast cancer Mother 19       BRCA neg   Hypertension Father    Cancer Paternal Uncle        pancreatic cancer   Hypertension Other        both sides   Cancer Other        maternal side   Prostate cancer Maternal Grandfather    Breast cancer Maternal Aunt 40   Breast cancer Maternal Aunt 60   Coronary artery disease Neg Hx    Diabetes Neg Hx    Colon cancer Neg Hx    Liver cancer Neg Hx     Social  History:   reports that she has never smoked. She has never used smokeless tobacco. She reports current alcohol use. She reports that she does not use drugs.  Medications  Current Facility-Administered Medications:    ondansetron (ZOFRAN) injection 4 mg, 4 mg, Intravenous, Once, Lucrezia Starch, MD   sodium chloride flush (NS) 0.9 % injection 3 mL, 3 mL, Intravenous, Once, Lucrezia Starch, MD  Current Outpatient Medications:    clotrimazole-betamethasone (LOTRISONE) cream, Apply 1 application topically 2 (two) times daily., Disp: 30 g, Rfl: 0   Crisaborole (EUCRISA) 2 % OINT, Apply to affected area once to twice daily as needed., Disp: 60 g, Rfl: 1   furosemide (LASIX) 80 MG tablet, Take 1 tablet (80 mg total) by mouth daily as needed., Disp: 30 tablet, Rfl: 11    naproxen sodium (ANAPROX) 220 MG tablet, Take 1 tablet by mouth 2 (two) times daily as needed., Disp: , Rfl:    omeprazole (PRILOSEC) 20 MG capsule, Take 1 capsule (20 mg total) by mouth daily., Disp: 90 capsule, Rfl: 3   triamterene-hydrochlorothiazide (MAXZIDE-25) 37.5-25 MG tablet, Take 1 tablet by mouth daily., Disp: 90 tablet, Rfl: 3   Exam: Current vital signs: BP (!) 155/87 (BP Location: Right Arm)    Pulse 80    Temp 97.6 F (36.4 C) (Oral)    Resp 20    Ht $R'5\' 4"'As$  (1.626 m)    Wt 114.8 kg    SpO2 100%    BMI 43.43 kg/m  Vital signs in last 24 hours: Temp:  [97.6 F (36.4 C)] 97.6 F (36.4 C) (11/06 0848) Pulse Rate:  [80] 80 (11/06 0848) Resp:  [20] 20 (11/06 0848) BP: (155)/(87) 155/87 (11/06 0848) SpO2:  [100 %] 100 % (11/06 0848) Weight:  [114.8 kg] 114.8 kg (11/06 0845) General: Awake, alert, in some distress due to her nausea and vomiting with an emesis bag in her hand and keeping her eyes closed. HEENT: Normocephalic, atraumatic, clear nares.  No neck stiffness. CVS: Regular rate rhythm Respiratory: Breathing well and saturating normally on room air with normal breath sounds Abdomen: Obese, nontender Extremities warm well perfused with trace edema Neurological exam Extremely anxious appearing due to her nausea and vomiting Had an emesis bag in her hand and was continuously dry heaving. Also noted to have some volitional shaking of all 4 extremities while being completely responsive to questions. Speech not dysarthric No evidence of aphasia Follows all commands Cranial nerves: Pupils equal round reactive to light, difficult to examine the extraocular movements as she continued to keep eyes tightly shut complaining of feeling dizzy when she opens them but to the best of my ability the exam performed did not reveal any nystagmus or restriction of extraocular movements.  Her visual fields are full.  Facial sensation and facial symmetry preserved.  Auditory acuity intact.   Tongue and palate midline. Motor exam: She is antigravity in both her upper extremities without drift.  Her right leg is mildly weaker than the left but she says that is because of her knee and hip pain. No drift noted in any of the 4 extremities. Some volitional shaking noted while not examining all 4 extremities. Sensory exam: Intact to touch without extinction all over Coordination: No dysmetria Gait testing deferred at this time NIH stroke scale-0  Labs I have reviewed labs in epic and the results pertinent to this consultation are:  CBC    Component Value Date/Time   WBC 11.6 (H) 02/27/2020 0908  RBC 4.18 02/27/2020 0908   HGB 13.0 02/27/2020 0908   HGB 14.0 10/20/2019 1103   HCT 38.9 02/27/2020 0908   HCT 41.8 10/20/2019 1103   PLT 281 02/27/2020 0908   PLT 261 10/20/2019 1103   MCV 93.1 02/27/2020 0908   MCV 90 10/20/2019 1103   MCV 89 08/12/2012 1057   MCH 31.1 02/27/2020 0908   MCHC 33.4 02/27/2020 0908   RDW 13.3 02/27/2020 0908   RDW 12.9 10/20/2019 1103   RDW 13.7 08/12/2012 1057   LYMPHSABS 3.6 02/27/2020 0908   LYMPHSABS 3.2 (H) 09/05/2017 1046   MONOABS 0.6 02/27/2020 0908   EOSABS 0.2 02/27/2020 0908   EOSABS 0.1 09/05/2017 1046   BASOSABS 0.1 02/27/2020 0908   BASOSABS 0.0 09/05/2017 1046    CMP     Component Value Date/Time   NA 139 02/27/2020 0908   NA 143 12/22/2019 1038   NA 134 (L) 08/12/2012 1057   K 3.7 02/27/2020 0908   K 3.8 08/12/2012 1057   CL 100 02/27/2020 0908   CL 101 08/12/2012 1057   CO2 28 02/27/2020 0908   CO2 28 08/12/2012 1057   GLUCOSE 143 (H) 02/27/2020 0908   GLUCOSE 112 (H) 08/12/2012 1057   BUN 16 02/27/2020 0908   BUN 20 12/22/2019 1038   BUN 10 08/12/2012 1057   CREATININE 0.83 02/27/2020 0908   CREATININE 0.85 08/12/2012 1057   CALCIUM 9.0 02/27/2020 0908   CALCIUM 8.9 08/12/2012 1057   PROT 7.8 02/27/2020 0908   PROT 6.8 10/20/2019 1103   PROT 7.5 08/12/2012 1057   ALBUMIN 4.4 02/27/2020 0908   ALBUMIN  4.3 12/22/2019 1038   ALBUMIN 3.3 (L) 08/12/2012 1057   AST 17 02/27/2020 0908   AST 15 08/12/2012 1057   ALT 15 02/27/2020 0908   ALT 22 08/12/2012 1057   ALKPHOS 64 02/27/2020 0908   ALKPHOS 74 08/12/2012 1057   BILITOT 0.8 02/27/2020 0908   BILITOT 0.5 10/20/2019 1103   BILITOT 0.7 08/12/2012 1057   GFRNONAA >60 02/27/2020 0908   GFRAA 79 12/22/2019 1038   cMP with glucose of 143, otherwise normal. CBC with mild leukocytosis 11.6 thousand, otherwise completely unremarkable. Normal PT/INR  Imaging I have reviewed the images obtained:  CT-scan of the brain-no acute changes.  Aspects 10.  No bleed. CTA head and neck with no emergent LVO CT perfusion with no abnormal findings.  Assessment: 64 year old woman with above past medical history presented from her place of work for sudden onset of dizziness and nausea along with vomiting. No localizing symptoms on examination.  Code stroke for concern for posterior circulation stroke. Noncontrasted head CT and CTA head and neck along with CT perfusion with no abnormal findings. Extremely anxious on examination-could be a component of anxiety in this presentation. Given her risk factors, would be prudent to do an MRI to rule out a posterior circulation stroke. If the MRI brain is unrevealing, I do not think she needs any further inpatient neurological work-up.  Impression: Evaluate for strokelike symptoms-differentials include anxiety   Recommendations: MRI brain without contrast as soon as possible once she is less nauseous. If negative, no further neurological work-up. If the MRI of the brain shows a stroke, will need admission for stroke work-up. Again not a candidate for TPA due to being outside the window and not a candidate for EVT due to no LVO evident on head and neck vessel imaging. Discussed my plan in detail with Dr. Tamala Julian, ED provider. We will  follow MRI and addend the note.   -- Amie Portland, MD [facility] Pager: [phone] If 7pm to 7am, please call on call as listed on AMION.  ADDENDUM MRI completed. No stroke. No acute changes. Partially empty sella likely incidental. No complaints of headache to suspect IIHT. No inpatient neurology recs at this time. Dr. Tamala Julian trying some compazine and ambulating, in anticipation for d/c home. Agree with his plan  -- Amie Portland, MD

## 2020-02-27 NOTE — ED Notes (Signed)
Pt ambulatory in hall with steady gait. Pt assisted to the bathroom, back to bed without incident. Pt states she is feeling better, states HA is now 3/10. Stroke swallow screen performed by this RN.

## 2020-02-27 NOTE — ED Provider Notes (Signed)
[facility] Emergency Department Provider Note  ____________________________________________   First MD Initiated Contact with Patient 02/27/20 [phone]     (approximate)  I have reviewed the triage vital signs and the nursing notes.   HISTORY  Chief Complaint Headache, Nausea, and Emesis   HPI Sabrina [name] is a 63 y.o. female with a past medical history of HTN, OSA, hypothyroidism, GERD, and allergic rhinitis who presents from her workplace after she developed sudden wooziness associated with dizziness nausea and photophobia.  Patient denies any falls or injuries.  She states her headache came on around 6 AM.  She states her headache came on very suddenly over several minutes and she has had significant nausea and nonbloody vomiting since.  She states she feels "woozy".  No prior similar episodes.  No clear precipitating events or other clear alleviating or aggravating factors other than light which seems to make the headache worse.  Patient denies any recent chest pain, abdominal pain, shortness of breath, cough, diarrhea, dysuria, blood in her stool, blood in urine, rash, focal extremity weakness numbness or tingling.  She denies taking any blood thinners.  Denies EtOH use, stretches, tobacco abuse.         Past Medical History:  Diagnosis Date  . Allergic rhinitis   . Arthritis    Right knee, right elbow  . Complex tear of medial meniscus of right knee 06/26/2016  . Family history of breast cancer    9/19 and 9/21 genetic testing letter sent  . GERD (gastroesophageal reflux disease)   . Hypertension   . Hypothyroidism 2008  . Obstructive sleep apnea    CPAP   --   NOT ABLE TO USE   . PONV (postoperative nausea and vomiting)    after knee surgery  . Viral meningitis 5/08   [facility]  . Wears dentures    partial upper and lower    Patient Active Problem List   Diagnosis Date Noted  . Essential hypertension, benign 10/20/2019  . Elevated blood sugar  10/20/2019  . History of vaginal dysplasia 11/29/2016  . Osteoarthritis of knee 09/23/2013  . Routine general medical examination at a health care facility 02/27/2011  . Chronic venous insufficiency 11/14/2009  . ALLERGIC RHINITIS 09/30/2009  . OBSTRUCTIVE SLEEP APNEA 10/13/2008  . Hypothyroidism 11/14/2006  . GERD 11/14/2006    Past Surgical History:  Procedure Laterality Date  . ABDOMINAL HYSTERECTOMY  1982   Dr Vernie Ammons assisted by Dr Bary Castilla  . APPENDECTOMY     teenager  . BREAST BIOPSY Left 03/09/2015   benign/ Dr. Bary Castilla done in office NEG  . BREAST EXCISIONAL BIOPSY Left 1999   [facility] NEG  . CHONDROPLASTY Right 06/26/2016   Procedure: CHONDROPLASTY;  Surgeon: Marchia Bond, MD;  Location: [facility];  Service: Orthopedics;  Laterality: Right;  . KNEE ARTHROSCOPY W/ LASER  8/07   Left  . KNEE ARTHROSCOPY WITH MEDIAL MENISECTOMY Right 06/26/2016   Procedure: RIGHT KNEE ARTHROSCOPY WITH MEDIAL MENISCECTOMY AND CHONDROPLASTY;  Surgeon: Marchia Bond, MD;  Location: [facility];  Service: Orthopedics;  Laterality: Right;  . MASS EXCISION N/A 02/19/2018   Procedure: EXCISION UPPER LIP MUCOCELE;  Surgeon: Carloyn Manner, MD;  Location: [facility];  Service: ENT;  Laterality: N/A;  sleep apnea  . TONSILLECTOMY     as child  . VESICOVAGINAL FISTULA CLOSURE W/ TAH  1987   Fibroids    Prior to Admission medications   Medication Sig Start Date  End Date Taking? Authorizing Provider  clotrimazole-betamethasone (LOTRISONE) cream Apply 1 application topically 2 (two) times daily. 01/11/20   Gae Dry, MD  Crisaborole (EUCRISA) 2 % OINT Apply to affected area once to twice daily as needed. 02/08/20   Ralene Bathe, MD  furosemide (LASIX) 80 MG tablet Take 1 tablet (80 mg total) by mouth daily as needed. 10/15/18   Venia Carbon, MD  naproxen sodium (ANAPROX) 220 MG tablet Take 1 tablet by mouth 2 (two) times daily as needed.     [provider]  omeprazole (PRILOSEC) 20 MG capsule Take 1 capsule (20 mg total) by mouth daily. 10/15/18   Venia Carbon, MD  triamterene-hydrochlorothiazide (MAXZIDE-25) 37.5-25 MG tablet Take 1 tablet by mouth daily. 10/20/19   Venia Carbon, MD    Allergies Patient has no known allergies.  Family History  Problem Relation Age of Onset  . Breast cancer Mother 33       BRCA neg  . Hypertension Father   . Cancer Paternal Uncle        pancreatic cancer  . Hypertension Other        both sides  . Cancer Other        maternal side  . Prostate cancer Maternal Grandfather   . Breast cancer Maternal Aunt 26  . Breast cancer Maternal Aunt 60  . Coronary artery disease Neg Hx   . Diabetes Neg Hx   . Colon cancer Neg Hx   . Liver cancer Neg Hx     Social History Social History   Tobacco Use  . Smoking status: Never Smoker  . Smokeless tobacco: Never Used  Vaping Use  . Vaping Use: Never used  Substance Use Topics  . Alcohol use: Yes    Comment: beer on weekends- occasional  . Drug use: No    Review of Systems  Review of Systems  Constitutional: Negative for chills and fever.  HENT: Negative for sore throat.   Eyes: Positive for photophobia. Negative for pain.  Respiratory: Negative for cough and stridor.   Cardiovascular: Negative for chest pain.  Gastrointestinal: Positive for nausea and vomiting.  Genitourinary: Negative for dysuria.  Musculoskeletal: Negative for myalgias.  Skin: Negative for rash.  Neurological: Positive for dizziness and headaches. Negative for seizures and loss of consciousness.  Psychiatric/Behavioral: Negative for suicidal ideas. The patient is nervous/anxious.   All other systems reviewed and are negative.     ____________________________________________   PHYSICAL EXAM:  VITAL SIGNS: ED Triage Vitals  Enc Vitals Group     BP 02/27/20 0848 (!) 155/87     Pulse Rate 02/27/20 0848 80     Resp 02/27/20 0848 20      Temp 02/27/20 0848 97.6 F (36.4 C)     Temp Source 02/27/20 0848 Oral     SpO2 02/27/20 0848 100 %     Weight 02/27/20 0845 253 lb (114.8 kg)     Height 02/27/20 0845 _0  (1.626 m)     Head Circumference --      Peak Flow --      Pain Score 02/27/20 0844 0     Pain Loc --      Pain Edu? --      Excl. in [place]? --    Vitals:   02/27/20 1203 02/27/20 1301  BP: 119/60 132/75  Pulse: 67 63  Resp: 20 17  Temp:    SpO2: 97% 98%   Physical Exam Vitals and  nursing note reviewed.  Constitutional:      General: She is in acute distress.     Appearance: She is well-developed.  HENT:     Head: Normocephalic and atraumatic.     Right Ear: External ear normal.     Left Ear: External ear normal.     Nose: Nose normal.  Eyes:     Conjunctiva/sclera: Conjunctivae normal.  Cardiovascular:     Rate and Rhythm: Normal rate and regular rhythm.     Heart sounds: No murmur heard.   Pulmonary:     Effort: Pulmonary effort is normal. No respiratory distress.     Breath sounds: Normal breath sounds.  Abdominal:     Palpations: Abdomen is soft.     Tenderness: There is no abdominal tenderness.  Musculoskeletal:     Cervical back: Neck supple.     Right lower leg: Edema present.     Left lower leg: Edema present.  Skin:    General: Skin is warm and dry.  Neurological:     General: No focal deficit present.     Mental Status: She is alert and oriented to person, place, and time.     No pronator drift.  No finger dysmetria.  There is a slight tremor in the bilateral upper extremities on pronator drift testing.  Patient is full symmetric strength all extremities.  Sensation is intact to light touch all extremities. ____________________________________________   LABS (all labs ordered are listed, but only abnormal results are displayed)  Labs Reviewed  CBC - Abnormal; Notable for the following components:      Result Value   WBC 11.6 (*)    All other components within normal limits    COMPREHENSIVE METABOLIC PANEL - Abnormal; Notable for the following components:   Glucose, Bld 143 (*)    All other components within normal limits  CBG MONITORING, ED - Abnormal; Notable for the following components:   Glucose-Capillary 108 (*)    All other components within normal limits  RESPIRATORY PANEL BY RT PCR (FLU A&B, COVID)  PROTIME-INR  APTT  DIFFERENTIAL  MAGNESIUM  BRAIN NATRIURETIC PEPTIDE  TROPONIN I (HIGH SENSITIVITY)   ____________________________________________  EKG  Sinus rhythm with a ventricular rate of 69, normal axis, unremarkable levels, nonspecific ST change in lead III without other evidence of acute ischemia or other significant underlying arrhythmia. ____________________________________________  RADIOLOGY  ED MD interpretation: CT head without contrast shows no evidence of acute intracranial bleeding or other acute intracranial process.  Chest x-ray has some evidence of cardiomegaly but no overt pulmonary edema, focal consolidation, pneumothorax, large effusion.  Official radiology report(s): CT Code Stroke CTA Head W/WO contrast  Result Date: 02/27/2020 CLINICAL DATA:  Stroke code. Concern for posterior circulation ischemia. EXAM: CT ANGIOGRAPHY HEAD AND NECK CT PERFUSION BRAIN TECHNIQUE: Multidetector CT imaging of the head and neck was performed using the standard protocol during bolus administration of intravenous contrast. Multiplanar CT image reconstructions and MIPs were obtained to evaluate the vascular anatomy. Carotid stenosis measurements (when applicable) are obtained utilizing NASCET criteria, using the distal internal carotid diameter as the denominator. Multiphase CT imaging of the brain was performed following IV bolus contrast injection. Subsequent parametric perfusion maps were calculated using RAPID software. CONTRAST:  120m OMNIPAQUE IOHEXOL 350 MG/ML SOLN COMPARISON:  Noncontrast head CT performed earlier the same day. FINDINGS: CTA  NECK FINDINGS Aortic arch: Standard aortic branching. The visualized aortic arch is unremarkable. Mild atherosclerotic plaque within the proximal major branch vessels of  the neck. No hemodynamically significant innominate or proximal subclavian artery stenosis. Right carotid system: CCA and ICA patent within the neck without stenosis. No significant atherosclerotic disease. Partially retropharyngeal course of the carotid arteries. Left carotid system: CCA and ICA patent within the neck without stenosis. No significant atherosclerotic disease. Partially retropharyngeal course of the carotid arteries. Vertebral arteries: Codominant and patent within the neck without stenosis. Skeleton: No acute bony abnormality or aggressive osseous lesion. Other neck: No neck mass or cervical lymphadenopathy. Upper chest: No consolidation within the imaged lung apices. Review of the MIP images confirms the above findings CTA HEAD FINDINGS Anterior circulation: The intracranial internal carotid arteries are patent. The M1 middle cerebral arteries are patent without significant stenosis. Atherosclerotic irregularity of the M2 and more distal MCA branch vessels bilaterally. No M2 proximal branch occlusion or high-grade proximal stenosis. The anterior cerebral arteries are patent. No intracranial aneurysm is identified. Posterior circulation: The intracranial vertebral arteries are patent. The basilar artery is patent. The posterior cerebral arteries are patent. Atherosclerotic irregularity of the distal posterior cerebral arteries bilaterally. Posterior communicating arteries are hypoplastic or absent bilaterally. Venous sinuses: Within limitations of contrast timing, no convincing thrombus. Anatomic variants: As described Review of the MIP images confirms the above findings CT Brain Perfusion Findings: ASPECTS: 10 CBF (<30%) Volume: 59m Perfusion (Tmax>6.0s) volume: 051mMismatch Volume: 41m76mnfarction Location:None identified No large  vessel occlusion. These results were called by telephone at the time of interpretation on 02/27/2020 at 9:40 am to provider [facility]Ad[facility] Delandwho verbally acknowledged these results. IMPRESSION: CTA neck: The common carotid, internal carotid and vertebral arteries are patent within the neck without hemodynamically significant stenosis. CTA head: No intracranial large vessel occlusion or proximal high-grade arterial stenosis. CT perfusion head: The perfusion software identifies no core infarct. The perfusion software identifies no region of critically hypoperfused parenchyma utilizing the Tmax>6 seconds threshold. Electronically Signed   By: KylKellie Simmering   On: 02/27/2020 09:54   DG Chest 1 View  Result Date: 02/27/2020 CLINICAL DATA:  [age]ar old female with history of dizziness and vomiting. EXAM: CHEST  1 VIEW COMPARISON:  No priors. FINDINGS: Lung volumes are low. No consolidative airspace disease. No pleural effusions. No pneumothorax. Cephalization of the pulmonary vasculature, without frank pulmonary edema. Heart size is borderline enlarged. Upper mediastinal contours are within normal limits. IMPRESSION: 1. Borderline cardiomegaly with cephalization of the pulmonary vasculature, but no frank pulmonary edema. Electronically Signed   By: DanVinnie LangtonD.   On: 02/27/2020 10:46   CT Code Stroke CTA Neck W/WO contrast  Result Date: 02/27/2020 CLINICAL DATA:  Stroke code. Concern for posterior circulation ischemia. EXAM: CT ANGIOGRAPHY HEAD AND NECK CT PERFUSION BRAIN TECHNIQUE: Multidetector CT imaging of the head and neck was performed using the standard protocol during bolus administration of intravenous contrast. Multiplanar CT image reconstructions and MIPs were obtained to evaluate the vascular anatomy. Carotid stenosis measurements (when applicable) are obtained utilizing NASCET criteria, using the distal internal carotid diameter as the denominator. Multiphase CT imaging of the brain was  performed following IV bolus contrast injection. Subsequent parametric perfusion maps were calculated using RAPID software. CONTRAST:  1041m16mNIPAQUE IOHEXOL 350 MG/ML SOLN COMPARISON:  Noncontrast head CT performed earlier the same day. FINDINGS: CTA NECK FINDINGS Aortic arch: Standard aortic branching. The visualized aortic arch is unremarkable. Mild atherosclerotic plaque within the proximal major branch vessels of the neck. No hemodynamically significant innominate or proximal subclavian artery stenosis. Right carotid system: CCA and ICA patent  within the neck without stenosis. No significant atherosclerotic disease. Partially retropharyngeal course of the carotid arteries. Left carotid system: CCA and ICA patent within the neck without stenosis. No significant atherosclerotic disease. Partially retropharyngeal course of the carotid arteries. Vertebral arteries: Codominant and patent within the neck without stenosis. Skeleton: No acute bony abnormality or aggressive osseous lesion. Other neck: No neck mass or cervical lymphadenopathy. Upper chest: No consolidation within the imaged lung apices. Review of the MIP images confirms the above findings CTA HEAD FINDINGS Anterior circulation: The intracranial internal carotid arteries are patent. The M1 middle cerebral arteries are patent without significant stenosis. Atherosclerotic irregularity of the M2 and more distal MCA branch vessels bilaterally. No M2 proximal branch occlusion or high-grade proximal stenosis. The anterior cerebral arteries are patent. No intracranial aneurysm is identified. Posterior circulation: The intracranial vertebral arteries are patent. The basilar artery is patent. The posterior cerebral arteries are patent. Atherosclerotic irregularity of the distal posterior cerebral arteries bilaterally. Posterior communicating arteries are hypoplastic or absent bilaterally. Venous sinuses: Within limitations of contrast timing, no convincing  thrombus. Anatomic variants: As described Review of the MIP images confirms the above findings CT Brain Perfusion Findings: ASPECTS: 10 CBF (<30%) Volume: 66m Perfusion (Tmax>6.0s) volume: 048mMismatch Volume: 51m73mnfarction Location:None identified No large vessel occlusion. These results were called by telephone at the time of interpretation on 02/27/2020 at 9:40 am to provider [facility] Pryorwho verbally acknowledged these results. IMPRESSION: CTA neck: The common carotid, internal carotid and vertebral arteries are patent within the neck without hemodynamically significant stenosis. CTA head: No intracranial large vessel occlusion or proximal high-grade arterial stenosis. CT perfusion head: The perfusion software identifies no core infarct. The perfusion software identifies no region of critically hypoperfused parenchyma utilizing the Tmax>6 seconds threshold. Electronically Signed   By: KylKellie Simmering   On: 02/27/2020 09:54   MR BRAIN WO CONTRAST  Result Date: 02/27/2020 CLINICAL DATA:  Neuro deficit, acute, stroke suspected. Additional history provided: Patient presents to the ED with vomiting and headache since 6 a.m. this morning. EXAM: MRI HEAD WITHOUT CONTRAST TECHNIQUE: Multiplanar, multiecho pulse sequences of the brain and surrounding structures were obtained without intravenous contrast. COMPARISON:  Noncontrast head CT, CT angiogram head/neck and CT perfusion performed earlier the same day 02/27/2020 FINDINGS: Brain: Cerebral volume is normal for age. Minimal ill-defined T2/FLAIR hyperintensity within the bilateral parietal white matter is nonspecific, but compatible with chronic small vessel ischemic disease. There is no acute infarct. No evidence of intracranial mass. No chronic intracranial blood products. No extra-axial fluid collection. No midline shift. Partially empty sella turcica. Vascular: Expected proximal arterial flow voids. Skull and upper cervical spine: No focal marrow lesion.  Sinuses/Orbits: Visualized orbits show no acute finding. Trace ethmoid sinus mucosal thickening. IMPRESSION: No evidence of acute intracranial abnormality, including acute infarction. Mild cerebral white matter chronic small vessel ischemic disease. Partially empty sella turcica. This finding is very commonly incidental, but can be associated with idiopathic intracranial hypertension. Electronically Signed   By: KylKellie Simmering   On: 02/27/2020 10:54   CT Code Stroke Cerebral Perfusion with contrast  Result Date: 02/27/2020 CLINICAL DATA:  Stroke code. Concern for posterior circulation ischemia. EXAM: CT ANGIOGRAPHY HEAD AND NECK CT PERFUSION BRAIN TECHNIQUE: Multidetector CT imaging of the head and neck was performed using the standard protocol during bolus administration of intravenous contrast. Multiplanar CT image reconstructions and MIPs were obtained to evaluate the vascular anatomy. Carotid stenosis measurements (when applicable) are obtained utilizing NASCET  criteria, using the distal internal carotid diameter as the denominator. Multiphase CT imaging of the brain was performed following IV bolus contrast injection. Subsequent parametric perfusion maps were calculated using RAPID software. CONTRAST:  146m OMNIPAQUE IOHEXOL 350 MG/ML SOLN COMPARISON:  Noncontrast head CT performed earlier the same day. FINDINGS: CTA NECK FINDINGS Aortic arch: Standard aortic branching. The visualized aortic arch is unremarkable. Mild atherosclerotic plaque within the proximal major branch vessels of the neck. No hemodynamically significant innominate or proximal subclavian artery stenosis. Right carotid system: CCA and ICA patent within the neck without stenosis. No significant atherosclerotic disease. Partially retropharyngeal course of the carotid arteries. Left carotid system: CCA and ICA patent within the neck without stenosis. No significant atherosclerotic disease. Partially retropharyngeal course of the carotid  arteries. Vertebral arteries: Codominant and patent within the neck without stenosis. Skeleton: No acute bony abnormality or aggressive osseous lesion. Other neck: No neck mass or cervical lymphadenopathy. Upper chest: No consolidation within the imaged lung apices. Review of the MIP images confirms the above findings CTA HEAD FINDINGS Anterior circulation: The intracranial internal carotid arteries are patent. The M1 middle cerebral arteries are patent without significant stenosis. Atherosclerotic irregularity of the M2 and more distal MCA branch vessels bilaterally. No M2 proximal branch occlusion or high-grade proximal stenosis. The anterior cerebral arteries are patent. No intracranial aneurysm is identified. Posterior circulation: The intracranial vertebral arteries are patent. The basilar artery is patent. The posterior cerebral arteries are patent. Atherosclerotic irregularity of the distal posterior cerebral arteries bilaterally. Posterior communicating arteries are hypoplastic or absent bilaterally. Venous sinuses: Within limitations of contrast timing, no convincing thrombus. Anatomic variants: As described Review of the MIP images confirms the above findings CT Brain Perfusion Findings: ASPECTS: 10 CBF (<30%) Volume: 054mPerfusion (Tmax>6.0s) volume: 11m511mismatch Volume: 11mL22mfarction Location:None identified No large vessel occlusion. These results were called by telephone at the time of interpretation on 02/27/2020 at 9:40 am to provider [facility]ISent[facility] Hospitalho verbally acknowledged these results. IMPRESSION: CTA neck: The common carotid, internal carotid and vertebral arteries are patent within the neck without hemodynamically significant stenosis. CTA head: No intracranial large vessel occlusion or proximal high-grade arterial stenosis. CT perfusion head: The perfusion software identifies no core infarct. The perfusion software identifies no region of critically hypoperfused parenchyma utilizing the Tmax>6  seconds threshold. Electronically Signed   By: KyleKellie Simmering  On: 02/27/2020 09:54   CT HEAD CODE STROKE WO CONTRAST  Result Date: 02/27/2020 CLINICAL DATA:  Code stroke. Neuro deficit, acute, stroke suspected. Additional provided: Headache, feeling "woozy" in head, nausea and vomiting. EXAM: CT HEAD WITHOUT CONTRAST TECHNIQUE: Contiguous axial images were obtained from the base of the skull through the vertex without intravenous contrast. COMPARISON:  Noncontrast head CT 10/10/2009. FINDINGS: Brain: Cerebral volume is normal. There is no acute intracranial hemorrhage. No demarcated cortical infarct. No extra-axial fluid collection. No evidence of intracranial mass. No midline shift. Partially empty sella turcica. Vascular: No definite hyperdense vessel. Skull: Normal. Negative for fracture or focal lesion. Sinuses/Orbits: Visualized orbits show no acute finding. No significant paranasal sinus disease at the imaged levels. ASPECTS ([facility] [place] Program Early CT Score) - Ganglionic level infarction (caudate, lentiform nuclei, internal capsule, insula, M1-M3 cortex): 7 - Supraganglionic infarction (M4-M6 cortex): 3 Total score (0-10 with 10 being normal): 10 These results were called by telephone at the time of interpretation on 02/27/2020 at 9:28 am to provider [facility] Centerho verbally acknowledged these results. IMPRESSION: No evidence of acute  intracranial abnormality. Partially empty sella turcica. This finding is very commonly incidental, but can be associated with idiopathic intracranial hypertension. Otherwise unremarkable noncontrast CT appearance of the brain. Electronically Signed   By: Kellie Simmering DO   On: 02/27/2020 09:29    ____________________________________________   PROCEDURES  Procedure(s) performed (including Critical Care):  .1-3 Lead EKG Interpretation Performed by: Lucrezia Starch, MD Authorized by: Lucrezia Starch, MD     Interpretation: normal     ECG rate  assessment: normal     Rhythm: sinus rhythm     Ectopy: none     Conduction: normal       ____________________________________________   INITIAL IMPRESSION / ASSESSMENT AND PLAN / ED COURSE        Patient presents for above to history exam for assessment of sudden onset nontraumatic headache associated with dizziness photophobia nausea and vomiting.  Patient is hypertensive with a BP of 135/87 otherwise stable vital signs on room air.  Primary differential includes but is not limited to spontaneous SAH, other acute spontaneous intracranial hemorrhage, CVA, peripheral vertigo, metabolic derangements, arrhythmia, and ACS.  No history of trauma.  No findings on history or exam to suggest an acute infectious process.  Due to concern for possible posterior circulation stroke patient was made code stroke on arrival. Neurology was consulted. Please see their notes for full details regarding evaluation assessment. In brief patient underwent CT vessel imaging of the head and neck which was unremarkable however neurology does recommend obtaining an MR brain which was ordered.  CMP shows no significant lateral or metabolic derangements.  Magnesium and BMP are unremarkable.  Chest x-ray shows no evidence of acute thoracic process.  CBC is remarkable for mild leukocytosis but otherwise unremarkable.  This is nonspecific but can be seen in the setting of vomiting.  Per neurology recommendation patient given some Valium to assist with some concern for possible anxiety contributing to her presentation.  Per additional history obtained by neurology consult patient has had recurrent episodes of this in the past related to significant stressful events and patient has recently undergone the death of several family members and is under a lot of stress lately.  MRI is unremarkable for any evidence of ischemia.  Patient reassessed after below noted medications were given and on assessment stated she felt better  and was observed ambulate with steady gait.  Given improvement in vital signs and symptoms with otherwise reassuring exam and work-up I would patient is a for discharge with plan for outpatient follow-up.  Patient discharged stable condition.  Return precautions advised and discussed.   ____________________________________________   FINAL CLINICAL IMPRESSION(S) / ED DIAGNOSES  Final diagnoses:  Nausea and vomiting, intractability of vomiting not specified, unspecified vomiting type  Nonintractable headache, unspecified chronicity pattern, unspecified headache type  Dizziness    Medications  sodium chloride flush (NS) 0.9 % injection 3 mL (3 mLs Intravenous Not Given 02/27/20 0942)  ondansetron (ZOFRAN) injection 4 mg (4 mg Intravenous Given 02/27/20 0924)  iohexol (OMNIPAQUE) 350 MG/ML injection 100 mL (100 mLs Intravenous Contrast Given 02/27/20 0923)  diazepam (VALIUM) injection 2.5 mg (2.5 mg Intravenous Given 02/27/20 0951)  prochlorperazine (COMPAZINE) injection 10 mg (10 mg Intravenous Given 02/27/20 1109)  lactated ringers bolus 500 mL (0 mLs Intravenous Stopped 02/27/20 1314)     ED Discharge Orders    None       Note:  This document was prepared using Dragon voice recognition software and may include unintentional dictation errors.  Lucrezia Starch, MD 02/27/20 1344

## 2020-02-27 NOTE — ED Notes (Signed)
Pt transported to MRI at this time 

## 2020-02-27 NOTE — Progress Notes (Signed)
CODE STROKE- PHARMACY COMMUNICATION   Time CODE STROKE called/page received: 0905  Time response to CODE STROKE was made (in person or via phone): NA - per RN note [phone], EDP cancelled code stroke  Time Stroke Kit retrieved from Pyxis (only if needed): NA  Name of Provider/Nurse contacted: NA    Tawnya Crook ,PharmD Clinical Pharmacist  02/27/2020  9:05 AM

## 2020-02-27 NOTE — ED Notes (Signed)
Covid Swab obtained by this RN. Pt appears more comfortable in the bed at this time, pt states she is feeling better, denies further N/V, MRI notified that patient is feeling better and stating she feels like she can tolerate MRI now.

## 2020-02-27 NOTE — ED Notes (Signed)
Pt placed back on cardiac monitor by this RN. Apologized for delay, explained will return and initiate IV fluids ASAP.

## 2020-02-27 NOTE — ED Notes (Signed)
NAD noted at time of D/C. Pt taken to lobby via wheelchair at this time. Verbal consent for D/C obtained at this time.

## 2020-02-27 NOTE — ED Notes (Signed)
Pt taken to CT by this RN and Neurologist. While in CT, 18G to [facility], In initiated by this RN for CT perfusion as ordered by Neurologist. 4mg  IV Zofran given by this RN while patient in CT due to patient vomiting while in CT scanner. Neurologist aware at this time. CT scans obtained and patient brought back to room by this RN. Neurologist at bedside to discuss patient care with patient's sister. X-ray at bedside to perform portable chest X-ray at this time.

## 2020-02-27 NOTE — ED Triage Notes (Signed)
Pt reports was at work, took her break at 4am and then around 6am she started to feel bad. Pt states she went to her locker and took some vinegar to see if it would help but she did not get relief. Pt states she does not have a headache but she feels "whoozy" in her head. Pt also reports some nausea and vomiting.

## 2020-02-27 NOTE — ED Triage Notes (Signed)
PERRLA, no facial drooping noted, grips equal bilaterally, Pt A & O x's4

## 2020-03-04 ENCOUNTER — Encounter: Payer: Self-pay | Admitting: Internal Medicine

## 2020-03-04 ENCOUNTER — Other Ambulatory Visit: Payer: Self-pay

## 2020-03-04 ENCOUNTER — Ambulatory Visit: Payer: No Typology Code available for payment source | Admitting: Internal Medicine

## 2020-03-04 DIAGNOSIS — L304 Erythema intertrigo: Secondary | ICD-10-CM

## 2020-03-04 DIAGNOSIS — R519 Headache, unspecified: Secondary | ICD-10-CM | POA: Insufficient documentation

## 2020-03-04 DIAGNOSIS — S46819A Strain of other muscles, fascia and tendons at shoulder and upper arm level, unspecified arm, initial encounter: Secondary | ICD-10-CM | POA: Insufficient documentation

## 2020-03-04 DIAGNOSIS — R112 Nausea with vomiting, unspecified: Secondary | ICD-10-CM

## 2020-03-04 DIAGNOSIS — S46811A Strain of other muscles, fascia and tendons at shoulder and upper arm level, right arm, initial encounter: Secondary | ICD-10-CM | POA: Diagnosis not present

## 2020-03-04 DIAGNOSIS — K219 Gastro-esophageal reflux disease without esophagitis: Secondary | ICD-10-CM

## 2020-03-04 MED ORDER — OMEPRAZOLE 20 MG PO CPDR
20.0000 mg | DELAYED_RELEASE_CAPSULE | Freq: Every day | ORAL | 3 refills | Status: DC
Start: 2020-03-04 — End: 2023-01-14

## 2020-03-04 MED ORDER — NYSTATIN 100000 UNIT/GM EX POWD: 1.0000 "application " | Freq: Three times a day (TID) | CUTANEOUS | 3 refills | Status: AC | PRN

## 2020-03-04 NOTE — Assessment & Plan Note (Signed)
Having some persisting throat symptoms since vomiting May be acid damage Will refill her omeprazole for daily use for now

## 2020-03-04 NOTE — Assessment & Plan Note (Signed)
I think the residual head pain is from muscle Discussed heat on this area

## 2020-03-04 NOTE — Assessment & Plan Note (Signed)
Started not feeling well--then N/V Not sure if this was the precipitating event---and then led to concerns about posterior circulation stroke All testing benign ?food poisoning Appetite not quite back to normal--but improving

## 2020-03-04 NOTE — Progress Notes (Signed)
Subjective:    Patient ID: Sabrina [name], female    DOB: 1955-12-21, 64 y.o.   MRN: [id number]  HPI  Here for ER follow up This visit occurred during the SARS-CoV-2 public health emergency.  Safety protocols were in place, including screening questions prior to the visit, additional usage of staff PPE, and extensive cleaning of exam room while observing appropriate contact time as indicated for disinfecting solutions.   Reviewed ER notes, angiograms and brain MRI  Was finishing work shift in AM (3rd shift) Has had a lot of stress there---closing a department and giving her more work May have gotten upset---started getting lightheaded Worsened and then passed out--so 911 activated Awoke before rescue came---and was nauseated and threw up (dry heaves) Continued to vomit at ER Some headache--but relates to crying and being upset about not feeling right  No chest pain No SOB Had sense of lump in the back of her throat---this is better but she still notices it some when swallowing.  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 clotrimazole-betamethasone (LOTRISONE) cream Apply 1 application topically 2 (two) times daily. 30 g 0  . Crisaborole (EUCRISA) 2 % OINT Apply to affected area once to twice daily as needed. 60 g 1  . furosemide (LASIX) 80 MG tablet Take 1 tablet (80 mg total) by mouth daily as needed. 30 tablet 11  . naproxen sodium (ANAPROX) 220 MG tablet Take 1 tablet by mouth 2 (two) times daily as needed.    Marland Kitchen omeprazole (PRILOSEC) 20 MG capsule Take 1 capsule (20 mg total) by mouth daily. 90 capsule 3  . triamterene-hydrochlorothiazide (MAXZIDE-25) 37.5-25 MG tablet Take 1 tablet by mouth daily. 90 tablet 3   No  current facility-administered medications on file prior to visit.    No Known Allergies  Past Medical History:  Diagnosis Date  . Allergic rhinitis   . Arthritis    Right knee, right elbow  . Complex tear of medial meniscus of right knee 06/26/2016  . Family history of breast cancer    9/19 and 9/21 genetic testing letter sent  . GERD (gastroesophageal reflux disease)   . Hypertension   . Hypothyroidism 2008  . Obstructive sleep apnea    CPAP   --   NOT ABLE TO USE   . PONV (postoperative nausea and vomiting)    after knee surgery  . Viral meningitis 5/08   [facility]  . Wears dentures    partial upper and lower    Past Surgical History:  Procedure Laterality Date  . ABDOMINAL HYSTERECTOMY  1982   Dr Vernie Ammons assisted by Dr Bary Castilla  . APPENDECTOMY     teenager  . BREAST BIOPSY Left 03/09/2015   benign/ Dr. Bary Castilla done in office NEG  . BREAST EXCISIONAL BIOPSY Left 1999   [facility] NEG  . CHONDROPLASTY Right 06/26/2016   Procedure: CHONDROPLASTY;  Surgeon: Marchia Bond, MD;  Location: [place];  Service: Orthopedics;  Laterality: Right;  . KNEE ARTHROSCOPY W/ LASER  8/07   Left  . KNEE ARTHROSCOPY WITH MEDIAL MENISECTOMY Right 06/26/2016   Procedure: RIGHT KNEE ARTHROSCOPY WITH MEDIAL MENISCECTOMY AND CHONDROPLASTY;  Surgeon: Marchia Bond, MD;  Location: [facility];  Service: Orthopedics;  Laterality: Right;  . MASS EXCISION N/A 02/19/2018   Procedure: EXCISION UPPER LIP MUCOCELE;  Surgeon: Carloyn Manner, MD;  Location: [place] [facility];  Service: ENT;  Laterality: N/A;  sleep apnea  . TONSILLECTOMY     as child  . VESICOVAGINAL FISTULA CLOSURE W/ TAH  1987   Fibroids    Family History  Problem Relation Age of Onset  . Breast cancer Mother 3       BRCA neg  . Hypertension Father   . Cancer Paternal Uncle        pancreatic cancer  . Hypertension Other        both sides  . Cancer Other        maternal side  . Prostate  cancer Maternal Grandfather   . Breast cancer Maternal Aunt 36  . Breast cancer Maternal Aunt 60  . Coronary artery disease Neg Hx   . Diabetes Neg Hx   . Colon cancer Neg Hx   . Liver cancer Neg Hx     Social History   Socioeconomic History  . Marital status: Married    Spouse name: Not on file  . Number of children: 0  . Years of education: Not on file  . Highest education level: Not on file  Occupational History  . Occupation: [employer] in [employer].     Employer: [employer]  . Occupation:      Comment:    Tobacco Use  . Smoking status: Never Smoker  . Smokeless tobacco: Never Used  Vaping Use  . Vaping Use: Never used  Substance and Sexual Activity  . Alcohol use: Yes    Comment: beer on weekends- occasional  . Drug use: No  . Sexual activity: Yes    Birth control/protection: None  Other Topics Concern  . Not on file  Social History Narrative   From [place].   Moved to [place] in high school then back here.     Social Determinants of Health   Financial Resource Strain:   . Difficulty of Paying Living Expenses: Not on file  Food Insecurity:   . Worried About Charity fundraiser in the Last Year: Not on file  . Ran Out of Food in the Last Year: Not on file  Transportation Needs:   . Lack of Transportation (Medical): Not on file  . Lack of Transportation (Non-Medical): Not on file  Physical Activity:   . Days of Exercise per Week: Not on file  . Minutes of Exercise per Session: Not on file  Stress:   . Feeling of Stress : Not on file  Social Connections:   . Frequency of Communication with Friends and Family: Not on file  . Frequency of Social Gatherings with Friends and Family: Not on file  . Attends Religious Services: Not on file  . Active Member of Clubs or Organizations: Not on file  . Attends Archivist Meetings: Not on file  . Marital Status: Not on file  Intimate Partner Violence:   . Fear of Current or Ex-Partner: Not on file  .  Emotionally Abused: Not on file  . Physically Abused: Not on file  . Sexually Abused: Not on file   Review of Systems Does have some pressure in the back of her head N/V have resolved Still feels tired Appetite is still not good Not sleeping well since the ER visit Has itching in low abdomen --got cream from derm but not  helping    Objective:   Physical Exam Constitutional:      Appearance: Normal appearance.  HENT:     Head:     Comments: Mild tenderness at trapezius insertion on right    Mouth/Throat:     Pharynx: No oropharyngeal exudate or posterior oropharyngeal erythema.  Neck:     Comments: Fairly normal ROM Cardiovascular:     Rate and Rhythm: Normal rate and regular rhythm.     Heart sounds: No murmur heard.  No gallop.   Pulmonary:     Effort: Pulmonary effort is normal.     Breath sounds: Normal breath sounds. No wheezing or rales.  Abdominal:     Palpations: Abdomen is soft.     Tenderness: There is no abdominal tenderness.  Musculoskeletal:     Cervical back: Neck supple.  Lymphadenopathy:     Cervical: No cervical adenopathy.  Neurological:     Mental Status: She is alert.  Psychiatric:        Mood and Affect: Mood normal.        Behavior: Behavior normal.            Assessment & Plan:

## 2020-03-04 NOTE — Assessment & Plan Note (Signed)
Will try antifungal powder

## 2020-03-10 ENCOUNTER — Other Ambulatory Visit: Payer: Self-pay

## 2020-03-10 ENCOUNTER — Ambulatory Visit: Payer: No Typology Code available for payment source | Admitting: Dermatology

## 2020-03-10 DIAGNOSIS — L905 Scar conditions and fibrosis of skin: Secondary | ICD-10-CM

## 2020-03-10 DIAGNOSIS — L209 Atopic dermatitis, unspecified: Secondary | ICD-10-CM | POA: Diagnosis not present

## 2020-03-10 DIAGNOSIS — R21 Rash and other nonspecific skin eruption: Secondary | ICD-10-CM | POA: Diagnosis not present

## 2020-03-10 MED ORDER — VALACYCLOVIR HCL 1 G PO TABS: ORAL_TABLET | ORAL | 11 refills | Status: DC

## 2020-03-10 NOTE — Progress Notes (Signed)
   Follow-Up Visit   Subjective  Sabrina [name] is a 64 y.o. female who presents for the following: Eczema (improved on the L forearm - patient currently using Eucrisa 2% ointment to aa's QD-BID PRN). Patient has some sensitivity of the buttocks crease.  She recently had shingles versus HSV of the left buttocks that is now clear after treatment with valacyclovir.  The following portions of the chart were reviewed this encounter and updated as appropriate:  Tobacco  Allergies  Meds  Problems  Med Hx  Surg Hx  Fam Hx     Review of Systems:  No other skin or systemic complaints except as noted in HPI or Assessment and Plan.  Objective  Well appearing patient in no apparent distress; mood and affect are within normal limits.  A focused examination was performed including buttocks, L forearm. Relevant physical exam findings are noted in the Assessment and Plan.  Objective  L forearm: Lichenifications.  Objective  Buttocks crease: Clear to visual exam and palpation.   Objective  L buttocks: Dyspigmented smooth macule or patch.    Assessment & Plan  Atopic dermatitis L forearm Improved but not at goal Continue Eucrisa to aa's QD-BID PRN. Consider Opzelura if patient flares while using Eucrisa.  Atopic dermatitis (eczema) is a chronic, relapsing, pruritic condition that can significantly affect quality of life. It is often associated with allergic rhinitis and/or asthma and can require treatment with topical medications, phototherapy, or in severe cases a biologic medication called Dupixent.   Other Related Medications Crisaborole (EUCRISA) 2 % OINT  Rash Buttocks crease Clear today - RTC PRN recurrence for culture.   valACYclovir (VALTREX) 1000 MG tablet - Buttocks crease  Scar L buttocks From HSV vs shingles - Clear today. May flare with trauma, sunlight exposure, and stress. Restart Valtrex PRN.   Return if symptoms worsen or fail to improve.  Luther Redo,  CMA, am acting as scribe for Sarina Ser, MD .  Documentation: I have reviewed the above documentation for accuracy and completeness, and I agree with the above.  Sarina Ser, MD

## 2020-03-15 ENCOUNTER — Encounter: Payer: Self-pay | Admitting: Dermatology

## 2020-03-15 ENCOUNTER — Telehealth: Payer: Self-pay | Admitting: Obstetrics and Gynecology

## 2020-03-15 NOTE — Telephone Encounter (Signed)
Pt rec'd genetic testing letter and is interested in having it done. She called with her current [employer], [place], on the line. She wanted to make sure the CPT is covered. She also adv that she wants to get this done before the end of the year because her insurance company will be changing.  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  I told her that I would ask Elmo Putt about this and someone would contact her on Monday.

## 2020-03-23 DIAGNOSIS — Z1371 Encounter for nonprocreative screening for genetic disease carrier status: Secondary | ICD-10-CM

## 2020-03-23 DIAGNOSIS — Z9189 Other specified personal risk factors, not elsewhere classified: Secondary | ICD-10-CM

## 2020-03-23 HISTORY — DX: Encounter for nonprocreative screening for genetic disease carrier status: Z13.71

## 2020-03-23 HISTORY — DX: Other specified personal risk factors, not elsewhere classified: Z91.89

## 2020-03-23 NOTE — Telephone Encounter (Signed)
Spoke with pt re; genetic testing. Pt interested and will RTO with CMA for lab draw. [facility] has test but prefer to use [facility] due to VUS/IBIS/Myrisk info. Pt notified she will be made aware of any OOP expense once [facility] receives specimen. Will call pt with results 4-5 wks later.

## 2020-03-28 ENCOUNTER — Other Ambulatory Visit: Payer: Self-pay

## 2020-03-28 ENCOUNTER — Ambulatory Visit: Payer: No Typology Code available for payment source

## 2020-03-30 ENCOUNTER — Ambulatory Visit: Payer: No Typology Code available for payment source | Admitting: Dermatology

## 2020-04-12 ENCOUNTER — Encounter: Payer: Self-pay | Admitting: Obstetrics and Gynecology

## 2020-05-05 ENCOUNTER — Other Ambulatory Visit: Payer: Self-pay | Admitting: Obstetrics and Gynecology

## 2020-05-05 DIAGNOSIS — Z803 Family history of malignant neoplasm of breast: Secondary | ICD-10-CM

## 2020-05-05 NOTE — Progress Notes (Signed)
MyRisk order placed for [facility], done 03/28/20

## 2020-05-11 ENCOUNTER — Telehealth: Payer: Self-pay | Admitting: Obstetrics and Gynecology

## 2020-05-11 NOTE — Telephone Encounter (Signed)
Pt aware of neg MyRisk results. IBIS=21.9%/riskscore=19.2%. Pt aware of recommendations of monthly SBE, yearly CBE and mammos, as well as scr breast MRI. Pt due for mammo, will sched. Will f/u for breast MRI ref if desires.   Patient understands these results only apply to her and her children, and this is not indicative of genetic testing results of her other family members. It is recommended that her other family members have genetic testing done. BRCA testing but not panel testing done in past.   Pt also understands negative genetic testing doesn't mean she will never get any of these cancers.   Hard copy mailed to pt. F/u prn.

## 2020-05-30 ENCOUNTER — Other Ambulatory Visit: Payer: Self-pay

## 2020-05-30 ENCOUNTER — Ambulatory Visit
Admission: RE | Admit: 2020-05-30 | Discharge: 2020-05-30 | Disposition: A | Discharge status: Home / Self Care | Payer: Managed Care, Other (non HMO) | Source: Ambulatory Visit | Attending: Obstetrics & Gynecology | Admitting: Obstetrics & Gynecology

## 2020-05-30 DIAGNOSIS — Z1231 Encounter for screening mammogram for malignant neoplasm of breast: Secondary | ICD-10-CM | POA: Diagnosis present

## 2020-05-31 ENCOUNTER — Other Ambulatory Visit: Payer: Self-pay | Admitting: Obstetrics & Gynecology

## 2020-05-31 DIAGNOSIS — R928 Other abnormal and inconclusive findings on diagnostic imaging of breast: Secondary | ICD-10-CM

## 2020-05-31 DIAGNOSIS — N6320 Unspecified lump in the left breast, unspecified quadrant: Secondary | ICD-10-CM

## 2020-05-31 NOTE — Progress Notes (Signed)
The results of your recent mammogram reveals an area that needs further magnification views to determine if there is any concern or if it is just a false alarm. There is no suggestion of cancer, just a need for further images and evaluation by the radiologist. They should be contacting you, if not already, to schedule these additional mammogram pictures. If you have any questions, or if they have not yet contacted you, then please give [place] a call at [phone], so that we may help in this process. I know this seems worrisome, yet usually additional xrays clear up any suspicion for breast cancer.   Orders placed Message left for patient about this  Barnett Applebaum, MD, [facility], [facility] 05/31/2020  2:51 PM

## 2020-06-09 ENCOUNTER — Ambulatory Visit
Admission: RE | Admit: 2020-06-09 | Discharge: 2020-06-09 | Disposition: A | Discharge status: Home / Self Care | Payer: Managed Care, Other (non HMO) | Source: Ambulatory Visit | Attending: Obstetrics & Gynecology | Admitting: Obstetrics & Gynecology

## 2020-06-09 ENCOUNTER — Other Ambulatory Visit: Payer: Self-pay

## 2020-06-09 DIAGNOSIS — R928 Other abnormal and inconclusive findings on diagnostic imaging of breast: Secondary | ICD-10-CM | POA: Diagnosis present

## 2020-06-10 ENCOUNTER — Other Ambulatory Visit: Payer: Self-pay | Admitting: Obstetrics & Gynecology

## 2020-06-10 DIAGNOSIS — N6320 Unspecified lump in the left breast, unspecified quadrant: Secondary | ICD-10-CM

## 2020-06-10 DIAGNOSIS — R928 Other abnormal and inconclusive findings on diagnostic imaging of breast: Secondary | ICD-10-CM

## 2020-06-10 NOTE — Progress Notes (Signed)
Discussed results with patient Planning to schedule [place] CNB L BREAST soon Offered referral as well for breast surgeon, as alternative prior to biopsy, or afterwards based on results She elects for [place] to cont w plans for biopsy She has anxiety as sister has breast cancer now.  Barnett Applebaum, MD, [facility], [facility] 06/10/2020  8:06 AM

## 2020-06-22 ENCOUNTER — Other Ambulatory Visit: Payer: Self-pay

## 2020-06-22 ENCOUNTER — Ambulatory Visit
Admission: RE | Admit: 2020-06-22 | Discharge: 2020-06-22 | Disposition: A | Discharge status: Home / Self Care | Payer: Managed Care, Other (non HMO) | Source: Ambulatory Visit | Attending: Obstetrics & Gynecology | Admitting: Obstetrics & Gynecology

## 2020-06-22 DIAGNOSIS — R928 Other abnormal and inconclusive findings on diagnostic imaging of breast: Secondary | ICD-10-CM | POA: Diagnosis not present

## 2020-06-22 DIAGNOSIS — N6320 Unspecified lump in the left breast, unspecified quadrant: Secondary | ICD-10-CM | POA: Diagnosis present

## 2020-06-22 HISTORY — PX: BREAST BIOPSY: SHX20

## 2020-06-23 LAB — SURGICAL PATHOLOGY

## 2020-08-30 ENCOUNTER — Ambulatory Visit: Payer: Managed Care, Other (non HMO) | Attending: Internal Medicine

## 2020-08-30 DIAGNOSIS — Z23 Encounter for immunization: Secondary | ICD-10-CM

## 2020-08-30 NOTE — Progress Notes (Signed)
   Covid-19 Vaccination Clinic  Name:  Sabrina [name]    MRN: [id number] DOB: 04/01/1956  08/30/2020  [name] was observed post Covid-19 immunization for 15 minutes without incident. She was provided with Vaccine Information Sheet and instruction to access the V-Safe system.   [name] was instructed to call 911 with any severe reactions post vaccine: Marland Kitchen Difficulty breathing  . Swelling of face and throat  . A fast heartbeat  . A bad rash all over body  . Dizziness and weakness   Immunizations Administered    Name Date Dose VIS Date Route   PFIZER Comrnaty(Gray TOP) Covid-19 Vaccine 08/30/2020  9:00 AM 0.3 mL 03/31/2020 Intramuscular   Manufacturer: [place]   Lot: [id number]   NDC: Antrell Tipler, PharmD, MBA Clinical Acute Care Pharmacist

## 2020-08-31 ENCOUNTER — Other Ambulatory Visit: Payer: Self-pay

## 2020-08-31 ENCOUNTER — Encounter: Payer: Self-pay | Admitting: Internal Medicine

## 2020-08-31 ENCOUNTER — Telehealth (INDEPENDENT_AMBULATORY_CARE_PROVIDER_SITE_OTHER): Payer: Managed Care, Other (non HMO) | Admitting: Internal Medicine

## 2020-08-31 DIAGNOSIS — J0110 Acute frontal sinusitis, unspecified: Secondary | ICD-10-CM

## 2020-08-31 MED ORDER — BENZONATATE 200 MG PO CAPS: 200.0000 mg | ORAL_CAPSULE | Freq: Three times a day (TID) | ORAL | 0 refills | Status: DC | PRN

## 2020-08-31 MED ORDER — PFIZER-BIONT COVID-19 VAC-TRIS 30 MCG/0.3ML IM SUSP
INTRAMUSCULAR | 0 refills | Status: DC
Start: 2020-08-30 — End: 2021-01-02
  Filled 2020-08-31: qty 0.3, 1d supply, fill #0

## 2020-08-31 MED ORDER — AMOXICILLIN-POT CLAVULANATE 875-125 MG PO TABS: 1.0000 | ORAL_TABLET | Freq: Two times a day (BID) | ORAL | 0 refills | Status: DC

## 2020-08-31 NOTE — Progress Notes (Signed)
Subjective:    Patient ID: Sabrina [name], female    DOB: 1955/10/23, 65 y.o.   MRN: [id number]  HPI  Video virtual visit due to persistent respiratory symptoms Identification done Reviewed limitations and billing and she gave consent Participants---patient in her home and I am in my office   For 2 weeks--had cough Thought it was allergies--but it is getting worse Now seems to be in her chest Taking some nyquil--helps her sleep some No fever Voice has been hoarse No SOB Does have frontal pressure/headache---aleve some help No ear pain No clear post nasal drip  Hasn't tested for COVID--but had second booster yesterday No other Rx Current Outpatient Medications on File Prior to Visit  Medication Sig Dispense Refill  . clotrimazole-betamethasone (LOTRISONE) cream Apply 1 application topically 2 (two) times daily. 30 g 0  . Crisaborole (EUCRISA) 2 % OINT Apply to affected area once to twice daily as needed. 60 g 1  . furosemide (LASIX) 80 MG tablet Take 1 tablet (80 mg total) by mouth daily as needed. 30 tablet 11  . naproxen sodium (ANAPROX) 220 MG tablet Take 1 tablet by mouth 2 (two) times daily as needed.    . nystatin (NYSTATIN) powder Apply 1 application topically 3 (three) times daily as needed. 60 g 3  . omeprazole (PRILOSEC) 20 MG capsule Take 1 capsule (20 mg total) by mouth daily. 90 capsule 3  . triamterene-hydrochlorothiazide (MAXZIDE-25) 37.5-25 MG tablet Take 1 tablet by mouth daily. 90 tablet 3  . valACYclovir (VALTREX) 1000 MG tablet Take 2 tabs po at onset of outbreak then 2 more tabs 12h later. 30 tablet 11   No current facility-administered medications on file prior to visit.    No Known Allergies  Past Medical History:  Diagnosis Date  . Allergic rhinitis   . Arthritis    Right knee, right elbow  . BRCA negative 03/2020   MyRisk neg except MSH3 VUS  . Complex tear of medial meniscus of right knee 06/26/2016  . Family history of breast cancer   . GERD  (gastroesophageal reflux disease)   . Hypertension   . Hypothyroidism 2008  . Increased risk of breast cancer 03/2020   IBIS=21.9%/riskscore=19.2%  . Obstructive sleep apnea    CPAP   --   NOT ABLE TO USE   . PONV (postoperative nausea and vomiting)    after knee surgery  . Viral meningitis 5/08   [facility]  . Wears dentures    partial upper and lower    Past Surgical History:  Procedure Laterality Date  . ABDOMINAL HYSTERECTOMY  1982   Dr Vernie Ammons assisted by Dr Bary Castilla  . APPENDECTOMY     teenager  . BREAST BIOPSY Left 03/09/2015   benign/ Dr. Bary Castilla done in office NEG  . BREAST BIOPSY Left 06/22/2020   [place] bx/ vision clip/ path pending  . BREAST EXCISIONAL BIOPSY Left 1999   [facility] NEG  . CHONDROPLASTY Right 06/26/2016   Procedure: CHONDROPLASTY;  Surgeon: Marchia Bond, MD;  Location: [facility];  Service: Orthopedics;  Laterality: Right;  . KNEE ARTHROSCOPY W/ LASER  8/07   Left  . KNEE ARTHROSCOPY WITH MEDIAL MENISECTOMY Right 06/26/2016   Procedure: RIGHT KNEE ARTHROSCOPY WITH MEDIAL MENISCECTOMY AND CHONDROPLASTY;  Surgeon: Marchia Bond, MD;  Location: [facility];  Service: Orthopedics;  Laterality: Right;  . MASS EXCISION N/A 02/19/2018   Procedure: EXCISION UPPER LIP MUCOCELE;  Surgeon: Carloyn Manner, MD;  Location: [facility];  Service: ENT;  Laterality: N/A;  sleep apnea  . TONSILLECTOMY     as child  . VESICOVAGINAL FISTULA CLOSURE W/ TAH  1987   Fibroids    Family History  Problem Relation Age of Onset  . Breast cancer Mother 53       BRCA neg  . Hypertension Father   . Cancer Paternal Uncle        pancreatic cancer  . Hypertension Other        both sides  . Cancer Other        maternal side  . Prostate cancer Maternal Grandfather   . Breast cancer Maternal Aunt 28  . Breast cancer Maternal Aunt 60  . Coronary artery disease Neg Hx   . Diabetes Neg Hx   . Colon cancer Neg Hx   . Liver cancer Neg Hx      Social History   Socioeconomic History  . Marital status: Married    Spouse name: Not on file  . Number of children: 0  . Years of education: Not on file  . Highest education level: Not on file  Occupational History  . Occupation: [employer] in [employer].     Employer: [employer]  . Occupation:      Comment:    Tobacco Use  . Smoking status: Never Smoker  . Smokeless tobacco: Never Used  Vaping Use  . Vaping Use: Never used  Substance and Sexual Activity  . Alcohol use: Yes    Comment: beer on weekends- occasional  . Drug use: No  . Sexual activity: Yes    Birth control/protection: None  Other Topics Concern  . Not on file  Social History Narrative   From [place].   Moved to [place] in high school then back here.     Social Determinants of Health   Financial Resource Strain: Not on file  Food Insecurity: Not on file  Transportation Needs: Not on file  Physical Activity: Not on file  Stress: Not on file  Social Connections: Not on file  Intimate Partner Violence: Not on file   Review of Systems No loss of smell or taste No N/V Eating okay    Objective:   Physical Exam Constitutional:      Appearance: Normal appearance.  Pulmonary:     Effort: Pulmonary effort is normal. No respiratory distress.  Neurological:     Mental Status: She is alert.            Assessment & Plan:

## 2020-08-31 NOTE — Assessment & Plan Note (Signed)
Has been sick for 2 weeks with ongoing cough, throat symptoms, frontal pressure Will try empiric augmentin Tessalon for cough Still able to work per their regulations (doubt she has COVID and is too far out to even consider Rx for that) Okay nyquil at night prn She will let me know if not improving or head to an urgent care for in person evaluation

## 2020-10-20 ENCOUNTER — Ambulatory Visit: Payer: Managed Care, Other (non HMO) | Admitting: Dermatology

## 2020-10-20 ENCOUNTER — Other Ambulatory Visit: Payer: Self-pay

## 2020-10-20 DIAGNOSIS — D485 Neoplasm of uncertain behavior of skin: Secondary | ICD-10-CM

## 2020-10-20 DIAGNOSIS — L980 Pyogenic granuloma: Secondary | ICD-10-CM

## 2020-10-20 NOTE — Patient Instructions (Signed)

## 2020-10-20 NOTE — Progress Notes (Signed)
   Follow-Up Visit   Subjective  Sabrina [name] is a 65 y.o. female who presents for the following: Spot Check (Pt has a sore spot on her left index finger that she would like looked at).  The following portions of the chart were reviewed this encounter and updated as appropriate:  Tobacco  Allergies  Meds  Problems  Med Hx  Surg Hx  Fam Hx      Review of Systems: No other skin or systemic complaints except as noted in HPI or Assessment and Plan.  Objective  Well appearing patient in no apparent distress; mood and affect are within normal limits.  A focused examination was performed including left hand. Relevant physical exam findings are noted in the Assessment and Plan.  Left index finger 0.6 keratotic papule Wart vs pyogenic granuloma  Assessment & Plan  Neoplasm of uncertain behavior of skin -wart versus pyogenic granuloma versus other Left index finger  Epidermal / dermal shaving  Lesion diameter (cm):  0.6 Informed consent: discussed and consent obtained   Timeout: patient name, date of birth, surgical site, and procedure verified   Procedure prep:  Patient was prepped and draped in usual sterile fashion Prep type:  Isopropyl alcohol Anesthesia: the lesion was anesthetized in a standard fashion   Anesthetic:  1% lidocaine w/ epinephrine 1-100,000 buffered w/ 8.4% NaHCO3 Instrument used: flexible razor blade   Hemostasis achieved with: pressure, aluminum chloride and electrodesiccation   Outcome: patient tolerated procedure well   Post-procedure details: sterile dressing applied and wound care instructions given   Dressing type: bandage and petrolatum    Sending biopsy to [facility]  2 pieces  Related Procedures Pathology ([facility])  Return if symptoms worsen or fail to improve.  IHarriett Sine, CMA, am acting as scribe for Sarina Ser, MD.  Documentation: I have reviewed the above documentation for accuracy and completeness, and I agree with the  above.  Sarina Ser, MD

## 2020-10-29 ENCOUNTER — Encounter: Payer: Self-pay | Admitting: Dermatology

## 2020-11-02 LAB — ANATOMIC PATHOLOGY REPORT

## 2020-11-07 ENCOUNTER — Telehealth: Payer: Self-pay

## 2020-11-07 NOTE — Telephone Encounter (Signed)
Discussed biopsy results with pt  °

## 2020-11-07 NOTE — Telephone Encounter (Signed)
-----   Message from Ralene Bathe, MD sent at 11/07/2020 11:55 AM EDT ----- Diagnosis synopsis: Comment  Comment: Specimen 1-Skin Biopsy, Left Index Finger: SUPERFICIAL  PORTION OF LOBULAR CAPILLARY HEMANGIOMA (PYOGENIC  GRANULOMA), ULCERATED  Benign Pyogenic Granuloma = exuberant healing tissue May recur Return to clinic if recurs

## 2020-12-13 ENCOUNTER — Other Ambulatory Visit: Payer: Self-pay | Admitting: Internal Medicine

## 2020-12-23 ENCOUNTER — Encounter: Payer: No Typology Code available for payment source | Admitting: Internal Medicine

## 2020-12-30 ENCOUNTER — Encounter: Payer: No Typology Code available for payment source | Admitting: Internal Medicine

## 2021-01-02 ENCOUNTER — Encounter: Payer: Managed Care, Other (non HMO) | Admitting: Internal Medicine

## 2021-01-02 ENCOUNTER — Other Ambulatory Visit: Payer: Self-pay

## 2021-01-02 ENCOUNTER — Encounter: Payer: Self-pay | Admitting: Internal Medicine

## 2021-01-02 ENCOUNTER — Ambulatory Visit (INDEPENDENT_AMBULATORY_CARE_PROVIDER_SITE_OTHER): Payer: Managed Care, Other (non HMO) | Admitting: Internal Medicine

## 2021-01-02 VITALS — BP 128/80 | HR 73 | Temp 97.0°F | Ht 64.0 in | Wt 253.0 lb

## 2021-01-02 DIAGNOSIS — Z23 Encounter for immunization: Secondary | ICD-10-CM

## 2021-01-02 DIAGNOSIS — I10 Essential (primary) hypertension: Secondary | ICD-10-CM

## 2021-01-02 DIAGNOSIS — I872 Venous insufficiency (chronic) (peripheral): Secondary | ICD-10-CM

## 2021-01-02 DIAGNOSIS — E039 Hypothyroidism, unspecified: Secondary | ICD-10-CM

## 2021-01-02 DIAGNOSIS — Z0000 Encounter for general adult medical examination without abnormal findings: Secondary | ICD-10-CM | POA: Diagnosis not present

## 2021-01-02 DIAGNOSIS — R7303 Prediabetes: Secondary | ICD-10-CM

## 2021-01-02 NOTE — Assessment & Plan Note (Signed)
Will recheck Will start metformin if close to 126

## 2021-01-02 NOTE — Assessment & Plan Note (Signed)
Healthy Will give Td She is reluctant for flu vaccine---discussed reconsidering Bivalent COVID vaccine when available Colonoscopy due 2024 Mammogram again in January She will confirm with gyn that she doesn't have a cervix Does some exercise

## 2021-01-02 NOTE — Assessment & Plan Note (Signed)
Still seems euthyroid off Rx

## 2021-01-02 NOTE — Assessment & Plan Note (Signed)
Uses furosemide prn Discussed potassium or magnesium for cramps

## 2021-01-02 NOTE — Patient Instructions (Signed)
Consider trying over the counter magnesium &/or potassium at bedtime to help with the cramps

## 2021-01-02 NOTE — Addendum Note (Signed)
Addended by: Pilar Grammes on: 01/02/2021 04:18 PM   Modules accepted: Orders

## 2021-01-02 NOTE — Progress Notes (Signed)
Subjective:    Patient ID: Sabrina [name], female    DOB: 1955-09-29, 65 y.o.   MRN: [id number]  HPI Here for physical This visit occurred during the SARS-CoV-2 public health emergency.  Safety protocols were in place, including screening questions prior to the visit, additional usage of staff PPE, and extensive cleaning of exam room while observing appropriate contact time as indicated for disinfecting solutions.   Doing well in general "Could be doing better"----gets cramps in left leg at night Mustard helps Goes back about 3 weeks Still tries to walk at work--nothing new Has been using the furosemide more lately---could be related  Stress with family illnesses--brother (leukemia), aunt, etc  Still works nights at [employer]  Current Outpatient Medications on File Prior to Visit  Medication Sig Dispense Refill   clotrimazole-betamethasone (LOTRISONE) cream Apply 1 application topically 2 (two) times daily. 30 g 0   Crisaborole (EUCRISA) 2 % OINT Apply to affected area once to twice daily as needed. 60 g 1   furosemide (LASIX) 80 MG tablet TAKE 1 TABLET BY MOUTH DAILY AS NEEDED 30 tablet 11   naproxen sodium (ANAPROX) 220 MG tablet Take 1 tablet by mouth 2 (two) times daily as needed.     nystatin (NYSTATIN) powder Apply 1 application topically 3 (three) times daily as needed. 60 g 3   omeprazole (PRILOSEC) 20 MG capsule Take 1 capsule (20 mg total) by mouth daily. 90 capsule 3   triamterene-hydrochlorothiazide (MAXZIDE-25) 37.5-25 MG tablet TAKE 1 TABLET BY MOUTH DAILY 90 tablet 3   valACYclovir (VALTREX) 1000 MG tablet Take 2 tabs po at onset of outbreak then 2 more tabs 12h later. 30 tablet 11   No current facility-administered medications on file prior to visit.    Not on File  Past Medical History:  Diagnosis Date   Allergic rhinitis    Arthritis    Right knee, right elbow   BRCA negative 03/2020   MyRisk neg except MSH3 VUS   Complex tear of medial meniscus of right  knee 06/26/2016   Family history of breast cancer    GERD (gastroesophageal reflux disease)    Hypertension    Hypothyroidism 2008   Increased risk of breast cancer 03/2020   IBIS=21.9%/riskscore=19.2%   Obstructive sleep apnea    CPAP   --   NOT ABLE TO USE    PONV (postoperative nausea and vomiting)    after knee surgery   Viral meningitis 5/08   [facility]   Wears dentures    partial upper and lower    Past Surgical History:  Procedure Laterality Date   ABDOMINAL HYSTERECTOMY  1982   Dr Haskel Khan assisted by Dr Lemar Livings   APPENDECTOMY     teenager   BREAST BIOPSY Left 03/09/2015   benign/ Dr. Lemar Livings done in office NEG   BREAST BIOPSY Left 06/22/2020   [place] bx/ vision clip/ path pending   BREAST EXCISIONAL BIOPSY Left 1999   [facility] NEG   CHONDROPLASTY Right 06/26/2016   Procedure: CHONDROPLASTY;  Surgeon: Teryl Lucy, MD;  Location: [facility];  Service: Orthopedics;  Laterality: Right;   KNEE ARTHROSCOPY W/ LASER  8/07   Left   KNEE ARTHROSCOPY WITH MEDIAL MENISECTOMY Right 06/26/2016   Procedure: RIGHT KNEE ARTHROSCOPY WITH MEDIAL MENISCECTOMY AND CHONDROPLASTY;  Surgeon: Teryl Lucy, MD;  Location: [facility];  Service: Orthopedics;  Laterality: Right;   MASS EXCISION N/A 02/19/2018   Procedure: EXCISION UPPER LIP MUCOCELE;  Surgeon: Andee Poles,  Jeannie Fend, MD;  Location: [facility];  Service: ENT;  Laterality: N/A;  sleep apnea   TONSILLECTOMY     as child   VESICOVAGINAL FISTULA CLOSURE W/ TAH  1987   Fibroids    Family History  Problem Relation Age of Onset   Breast cancer Mother 57       BRCA neg   Hypertension Father    Cancer Paternal Uncle        pancreatic cancer   Hypertension Other        both sides   Cancer Other        maternal side   Prostate cancer Maternal Grandfather    Breast cancer Maternal Aunt 40   Breast cancer Maternal Aunt 60   Coronary artery disease Neg Hx    Diabetes Neg Hx    Colon cancer Neg Hx     Liver cancer Neg Hx     Social History   Socioeconomic History   Marital status: Married    Spouse name: Not on file   Number of children: 0   Years of education: Not on file   Highest education level: Not on file  Occupational History   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     Employer: [employer]   Occupation:      Comment:    Tobacco Use   Smoking status: Never   Smokeless tobacco: Never  Vaping Use   Vaping Use: Never used  Substance and Sexual Activity   Alcohol use: Yes    Comment: beer on weekends- occasional   Drug use: No   Sexual activity: Yes    Birth control/protection: None  Other Topics Concern   Not on file  Social History Narrative   From [place].   Moved to [place] in high school then back here.     Social Determinants of Health   Financial Resource Strain: Not on file  Food Insecurity: Not on file  Transportation Needs: Not on file  Physical Activity: Not on file  Stress: Not on file  Social Connections: Not on file  Intimate Partner Violence: Not on file   Review of Systems  Constitutional:  Negative for fatigue and unexpected weight change.       Wears seat belt  HENT:  Negative for hearing loss, tinnitus and trouble swallowing.        Keeps up with dentist  Eyes:  Negative for visual disturbance.       No diplopia or unilateral vision loss  Respiratory:  Negative for cough, chest tightness and shortness of breath.   Cardiovascular:  Positive for leg swelling. Negative for chest pain and palpitations.  Gastrointestinal:  Negative for blood in stool and constipation.       Occasional heartburn----uses omeprazole prn  Endocrine: Negative for polydipsia and polyuria.  Genitourinary:  Negative for dysuria and hematuria.  Musculoskeletal:  Negative for back pain and joint swelling.       Ongoing knee pain---getting cortisone shots. 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  Allergic/Immunologic: Positive for  environmental allergies. Negative for immunocompromised state.       OTC loratadine does help  Neurological:  Negative for dizziness, syncope, light-headedness and headaches.  Hematological:  Negative for adenopathy. Does not bruise/bleed easily.  Psychiatric/Behavioral:  Negative for dysphoric mood and self-injury.        Sleeps first when coming home      Objective:   Physical Exam  Constitutional:      Appearance: Normal appearance.  HENT:     Right Ear: Tympanic membrane and ear canal normal.     Left Ear: Tympanic membrane and ear canal normal.     Mouth/Throat:     Pharynx: No oropharyngeal exudate or posterior oropharyngeal erythema.  Eyes:     Conjunctiva/sclera: Conjunctivae normal.     Pupils: Pupils are equal, round, and reactive to light.  Cardiovascular:     Rate and Rhythm: Normal rate and regular rhythm.     Pulses: Normal pulses.     Heart sounds: No murmur heard.   No gallop.  Pulmonary:     Effort: Pulmonary effort is normal.     Breath sounds: Normal breath sounds. No wheezing or rales.  Abdominal:     Palpations: Abdomen is soft.     Tenderness: There is no abdominal tenderness.  Musculoskeletal:     Cervical back: Neck supple.     Comments: 2+ non pitting edema in calves---not feet  Lymphadenopathy:     Cervical: No cervical adenopathy.  Skin:    General: Skin is warm.     Findings: No rash.  Neurological:     General: No focal deficit present.     Mental Status: She is alert and oriented to person, place, and time.  Psychiatric:        Mood and Affect: Mood normal.        Behavior: Behavior normal.           Assessment & Plan:

## 2021-01-02 NOTE — Assessment & Plan Note (Signed)
BP Readings from Last 3 Encounters:  01/02/21 128/80  03/04/20 130/88  02/27/20 110/72   Good control on HCTZ/triamterene

## 2021-01-03 LAB — HEMOGLOBIN A1C
Est. average glucose Bld gHb Est-mCnc: 131 mg/dL
Hgb A1c MFr Bld: 6.2 % — ABNORMAL HIGH (ref 4.8–5.6)

## 2021-01-03 LAB — COMPREHENSIVE METABOLIC PANEL
ALT: 15 IU/L (ref 0–32)
AST: 12 IU/L (ref 0–40)
Albumin/Globulin Ratio: 1.6 (ref 1.2–2.2)
Albumin: 4.2 g/dL (ref 3.8–4.8)
Alkaline Phosphatase: 78 IU/L (ref 44–121)
BUN/Creatinine Ratio: 18 (ref 12–28)
BUN: 15 mg/dL (ref 8–27)
Bilirubin Total: 0.5 mg/dL (ref 0.0–1.2)
CO2: 26 mmol/L (ref 20–29)
Calcium: 8.9 mg/dL (ref 8.7–10.3)
Chloride: 100 mmol/L (ref 96–106)
Creatinine, Ser: 0.85 mg/dL (ref 0.57–1.00)
Globulin, Total: 2.6 g/dL (ref 1.5–4.5)
Glucose: 103 mg/dL — ABNORMAL HIGH (ref 65–99)
Potassium: 3.8 mmol/L (ref 3.5–5.2)
Sodium: 138 mmol/L (ref 134–144)
Total Protein: 6.8 g/dL (ref 6.0–8.5)
eGFR: 77 mL/min/{1.73_m2} (ref >59)

## 2021-01-03 LAB — TSH: TSH: 3.35 u[IU]/mL (ref 0.450–4.500)

## 2021-01-03 LAB — LIPID PANEL
Chol/HDL Ratio: 3.5 ratio (ref 0.0–4.4)
Cholesterol, Total: 214 mg/dL — ABNORMAL HIGH (ref 100–199)
HDL: 61 mg/dL (ref >39)
LDL Chol Calc (NIH): 142 mg/dL — ABNORMAL HIGH (ref 0–99)
Triglycerides: 63 mg/dL (ref 0–149)
VLDL Cholesterol Cal: 11 mg/dL (ref 5–40)

## 2021-01-03 LAB — T4, FREE: Free T4: 1.06 ng/dL (ref 0.82–1.77)

## 2021-01-03 LAB — CBC
Hematocrit: 38.4 % (ref 34.0–46.6)
Hemoglobin: 13.4 g/dL (ref 11.1–15.9)
MCH: 30.7 pg (ref 26.6–33.0)
MCHC: 34.9 g/dL (ref 31.5–35.7)
MCV: 88 fL (ref 79–97)
Platelets: 291 10*3/uL (ref 150–450)
RBC: 4.36 x10E6/uL (ref 3.77–5.28)
RDW: 12.6 % (ref 11.7–15.4)
WBC: 11 10*3/uL — ABNORMAL HIGH (ref 3.4–10.8)

## 2021-01-03 LAB — MAGNESIUM: Magnesium: 2.2 mg/dL (ref 1.6–2.3)

## 2021-01-06 ENCOUNTER — Telehealth: Payer: Self-pay

## 2021-01-06 NOTE — Telephone Encounter (Signed)
Left message to call office to see if she wants to start metformin as per Dr Alla German note in the labs.

## 2021-01-11 MED ORDER — METFORMIN HCL ER 500 MG PO TB24: 500.0000 mg | ORAL_TABLET | Freq: Every day | ORAL | 3 refills | Status: DC

## 2021-01-11 NOTE — Telephone Encounter (Signed)
Spoke to pt

## 2021-01-11 NOTE — Telephone Encounter (Signed)
Pt called stating that she do wants to start metformin and to be called in [place].

## 2021-01-11 NOTE — Telephone Encounter (Signed)
Rx sent in

## 2021-01-11 NOTE — Addendum Note (Signed)
Addended by: Pilar Grammes on: 01/11/2021 03:27 PM   Modules accepted: Orders

## 2021-01-16 ENCOUNTER — Encounter: Payer: Self-pay | Admitting: Obstetrics & Gynecology

## 2021-01-16 ENCOUNTER — Other Ambulatory Visit: Payer: Self-pay

## 2021-01-16 ENCOUNTER — Ambulatory Visit (INDEPENDENT_AMBULATORY_CARE_PROVIDER_SITE_OTHER): Payer: Managed Care, Other (non HMO) | Admitting: Obstetrics & Gynecology

## 2021-01-16 VITALS — BP 130/90 | Ht 64.0 in | Wt 255.0 lb

## 2021-01-16 DIAGNOSIS — Z1211 Encounter for screening for malignant neoplasm of colon: Secondary | ICD-10-CM | POA: Diagnosis not present

## 2021-01-16 DIAGNOSIS — Z01419 Encounter for gynecological examination (general) (routine) without abnormal findings: Secondary | ICD-10-CM

## 2021-01-16 DIAGNOSIS — Z1231 Encounter for screening mammogram for malignant neoplasm of breast: Secondary | ICD-10-CM

## 2021-01-16 MED ORDER — CLOTRIMAZOLE-BETAMETHASONE 1-0.05 % EX CREA: 1.0000 "application " | TOPICAL_CREAM | Freq: Two times a day (BID) | CUTANEOUS | 0 refills | Status: DC

## 2021-01-16 NOTE — Patient Instructions (Signed)
PAP every 5 years Mammogram every year    Call [phone] to schedule at [facility] Colonoscopy every 10 years    Colon cards yearly Labs yearly (with PCP)  Thank you for choosing [facility]. As part of our ongoing efforts to improve patient experience, we would appreciate your feedback. Please fill out the short survey that you will receive by mail or MyChart. Your opinion is important to [place]! - Dr. Kenton Kingfisher

## 2021-01-16 NOTE — Progress Notes (Signed)
HPI:      [name] is a 65 y.o. G0P0000 who LMP was in the past, she presents today for her annual examination.  The patient has no complaints today. The patient is sexually active. Herlast pap: approximate date 2020 and was normal and last mammogram: approximate date 05/2020 and was abnormal: followed by images and biopsy that was normal .  The patient does perform self breast exams.  There is notable family history of breast or ovarian cancer in her family. The patient is not taking hormone replacement therapy. Patient denies post-menopausal vaginal bleeding.   The patient has regular exercise: yes. The patient denies current symptoms of depression.    GYN Hx: Last Colonoscopy:4 years ago. Normal.   PMHx: Past Medical History:  Diagnosis Date   Allergic rhinitis    Arthritis    Right knee, right elbow   BRCA negative 03/2020   MyRisk neg except MSH3 VUS   Complex tear of medial meniscus of right knee 06/26/2016   Family history of breast cancer    GERD (gastroesophageal reflux disease)    Hypertension    Hypothyroidism 2008   Increased risk of breast cancer 03/2020   IBIS=21.9%/riskscore=19.2%   Obstructive sleep apnea    CPAP   --   NOT ABLE TO USE    PONV (postoperative nausea and vomiting)    after knee surgery   Viral meningitis 5/08   [facility]   Wears dentures    partial upper and lower   Past Surgical History:  Procedure Laterality Date   ABDOMINAL HYSTERECTOMY  1982   Dr Vernie Ammons assisted by Dr Bary Castilla   APPENDECTOMY     teenager   BREAST BIOPSY Left 03/09/2015   benign/ Dr. Bary Castilla done in office NEG   BREAST BIOPSY Left 06/22/2020   [place] bx/ vision clip/ path pending   BREAST EXCISIONAL BIOPSY Left 1999   [facility] NEG   CHONDROPLASTY Right 06/26/2016   Procedure: CHONDROPLASTY;  Surgeon: Marchia Bond, MD;  Location: [place] [facility];  Service: Orthopedics;  Laterality: Right;   KNEE ARTHROSCOPY W/ LASER  8/07   Left   KNEE ARTHROSCOPY WITH  MEDIAL MENISECTOMY Right 06/26/2016   Procedure: RIGHT KNEE ARTHROSCOPY WITH MEDIAL MENISCECTOMY AND CHONDROPLASTY;  Surgeon: Marchia Bond, MD;  Location: [facility];  Service: Orthopedics;  Laterality: Right;   MASS EXCISION N/A 02/19/2018   Procedure: EXCISION UPPER LIP MUCOCELE;  Surgeon: Carloyn Manner, MD;  Location: [place];  Service: ENT;  Laterality: N/A;  sleep apnea   TONSILLECTOMY     as child   VESICOVAGINAL FISTULA CLOSURE W/ TAH  1987   Fibroids   Family History  Problem Relation Age of Onset   Breast cancer Mother 31       BRCA neg   Hypertension Father    Cancer Paternal Uncle        pancreatic cancer   Hypertension Other        both sides   Cancer Other        maternal side   Prostate cancer Maternal Grandfather    Breast cancer Maternal Aunt 63   Breast cancer Maternal Aunt 60   Coronary artery disease Neg Hx    Diabetes Neg Hx    Colon cancer Neg Hx    Liver cancer Neg Hx    Social History   Tobacco Use   Smoking status: Never   Smokeless tobacco: Never  Vaping Use   Vaping  Use: Never used  Substance Use Topics   Alcohol use: Yes    Comment: beer on weekends- occasional   Drug use: No    Current Outpatient Medications:    clotrimazole-betamethasone (LOTRISONE) cream, Apply 1 application topically 2 (two) times daily., Disp: 30 g, Rfl: 0   Crisaborole (EUCRISA) 2 % OINT, Apply to affected area once to twice daily as needed., Disp: 60 g, Rfl: 1   furosemide (LASIX) 80 MG tablet, TAKE 1 TABLET BY MOUTH DAILY AS NEEDED, Disp: 30 tablet, Rfl: 11   metFORMIN (GLUCOPHAGE-XR) 500 MG 24 hr tablet, Take 1 tablet (500 mg total) by mouth daily with breakfast., Disp: 90 tablet, Rfl: 3   naproxen sodium (ANAPROX) 220 MG tablet, Take 1 tablet by mouth 2 (two) times daily as needed., Disp: , Rfl:    nystatin (NYSTATIN) powder, Apply 1 application topically 3 (three) times daily as needed., Disp: 60 g, Rfl: 3   omeprazole (PRILOSEC) 20 MG  capsule, Take 1 capsule (20 mg total) by mouth daily., Disp: 90 capsule, Rfl: 3   triamterene-hydrochlorothiazide (MAXZIDE-25) 37.5-25 MG tablet, TAKE 1 TABLET BY MOUTH DAILY, Disp: 90 tablet, Rfl: 3   valACYclovir (VALTREX) 1000 MG tablet, Take 2 tabs po at onset of outbreak then 2 more tabs 12h later., Disp: 30 tablet, Rfl: 11 Allergies: Patient has no allergy information on record.  Review of Systems  Constitutional:  Negative for chills, fever and malaise/fatigue.  HENT:  Negative for congestion, sinus pain and sore throat.   Eyes:  Negative for blurred vision and pain.  Respiratory:  Negative for cough and wheezing.   Cardiovascular:  Negative for chest pain and leg swelling.  Gastrointestinal:  Negative for abdominal pain, constipation, diarrhea, heartburn, nausea and vomiting.  Genitourinary:  Negative for dysuria, frequency, hematuria and urgency.  Musculoskeletal:  Negative for back pain, joint pain, myalgias and neck pain.  Skin:  Negative for itching and rash.  Neurological:  Negative for dizziness, tremors and weakness.  Endo/Heme/Allergies:  Does not bruise/bleed easily.  Psychiatric/Behavioral:  Negative for depression. The patient is not nervous/anxious and does not have insomnia.    Objective: BP 130/90   Ht _0  (1.626 m)   Wt 255 lb (115.7 kg)   BMI 43.77 kg/m   Filed Weights   01/16/21 1006  Weight: 255 lb (115.7 kg)   Body mass index is 43.77 kg/m. Physical Exam Constitutional:      General: She is not in acute distress.    Appearance: She is well-developed.  Genitourinary:     Vulva, bladder, rectum and urethral meatus normal.     No lesions in the vagina.     Genitourinary Comments: Vaginal cuff well healed     Right Labia: No rash, tenderness or lesions.    Left Labia: No tenderness, lesions or rash.    No vaginal bleeding.      Right Adnexa: not tender and no mass present.    Left Adnexa: not tender and no mass present.    Cervix is absent.      Uterus is absent.     Pelvic exam was performed with patient in the lithotomy position.  Breasts:    Right: No mass, skin change or tenderness.     Left: No mass, skin change or tenderness.  HENT:     Head: Normocephalic and atraumatic. No laceration.     Right Ear: Hearing normal.     Left Ear: Hearing normal.     Mouth/Throat:  Pharynx: Uvula midline.  Eyes:     Pupils: Pupils are equal, round, and reactive to light.  Neck:     Thyroid: No thyromegaly.  Cardiovascular:     Rate and Rhythm: Normal rate and regular rhythm.     Heart sounds: No murmur heard.   No friction rub. No gallop.  Pulmonary:     Effort: Pulmonary effort is normal. No respiratory distress.     Breath sounds: Normal breath sounds. No wheezing.  Abdominal:     General: Bowel sounds are normal. There is no distension.     Palpations: Abdomen is soft.     Tenderness: There is no abdominal tenderness. There is no rebound.  Musculoskeletal:        General: Normal range of motion.     Cervical back: Normal range of motion and neck supple.  Neurological:     Mental Status: She is alert and oriented to person, place, and time.     Cranial Nerves: No cranial nerve deficit.  Skin:    General: Skin is warm and dry.  Psychiatric:        Judgment: Judgment normal.  Vitals reviewed.    Assessment: Annual Exam 1. Women's annual routine gynecological examination   2. Screen for colon cancer   3. Encounter for screening mammogram for malignant neoplasm of breast     Plan:            1.  Vaginal Screening-  Pap smear schedule reviewed with patient  2. Breast screening- Exam annually and mammogram scheduled  3. Colonoscopy every 10 years, Hemoccult testing after age 21  4. Labs managed by PCP  5. Counseling for hormonal therapy: none              6. FRAX - FRAX score for assessing the 10 year probability for fracture calculated and discussed today.  Based on age and score today, DEXA is not currently  scheduled.   7. Lotrisone for skin rash/ rxn    F/U  Return in about 1 year (around 01/16/2022) for Annual.  Barnett Applebaum, MD, [facility], [facility] 01/16/2021  10:45 AM

## 2021-03-13 ENCOUNTER — Other Ambulatory Visit: Payer: Self-pay

## 2021-03-13 ENCOUNTER — Ambulatory Visit: Payer: Managed Care, Other (non HMO) | Admitting: Dermatology

## 2021-03-13 DIAGNOSIS — L209 Atopic dermatitis, unspecified: Secondary | ICD-10-CM

## 2021-03-13 DIAGNOSIS — L819 Disorder of pigmentation, unspecified: Secondary | ICD-10-CM

## 2021-03-13 DIAGNOSIS — L304 Erythema intertrigo: Secondary | ICD-10-CM

## 2021-03-13 MED ORDER — EUCRISA 2 % EX OINT: TOPICAL_OINTMENT | CUTANEOUS | 6 refills | Status: DC

## 2021-03-13 NOTE — Progress Notes (Signed)
   Follow-Up Visit   Subjective  Sabrina [name] is a 65 y.o. female who presents for the following: Follow-up (Patient here today for 1 year follow up for atopic dermatitis on elbows. She currently uses eucrisa as needed to affected areas.  Her eczema is doing better but not clear.  She also has a rash under her abdomen she would like evaluated and treated.  Its been present for a long time and waxes and wanes.  The following portions of the chart were reviewed this encounter and updated as appropriate:  Tobacco  Allergies  Meds  Problems  Med Hx  Surg Hx  Fam Hx      Review of Systems: No other skin or systemic complaints except as noted in HPI or Assessment and Plan.  Objective  Well appearing patient in no apparent distress; mood and affect are within normal limits.  A focused examination was performed including elbows, back, arms and left index finger. Relevant physical exam findings are noted in the Assessment and Plan.  b/l elbows and back Patch of hyperpigmentation and excoriation at back      lower waist Erythematous plaque with scale  Assessment & Plan  Atopic dermatitis, with dyschromia and excoriation -flared b/l elbows and back Atopic dermatitis (eczema) is a chronic, relapsing, pruritic condition that can significantly affect quality of life. It is often associated with allergic rhinitis and/or asthma and can require treatment with topical medications, phototherapy, or in severe cases biologic injectable medication (Dupixent; Adbry) or Oral JAK inhibitors.  Improved but persistent and not to goal  Continue Crisaborole 2 % ointment - apply to affected areas once to twice daily as needed.   Related Medications Crisaborole (EUCRISA) 2 % OINT Apply to affected area once to twice daily as needed.  Erythema intertrigo lower waist Intertrigo is a chronic recurrent rash that occurs in skin fold areas that may be associated with friction; heat; moisture; yeast;  fungus; and bacteria.  It is exacerbated by increased movement / activity; sweating; and higher atmospheric temperature.  Start Skin Medicinals Iodoquinol 1%, Hydrocortisone 2.5%, Niacinamide 2% Cream twice a day to affected areas for up to two weeks.  The patient was advised this is not covered by insurance since it is made by a compounding pharmacy. They will receive an email to check out and the medication will be mailed to their home.  6 rfls   Return for 6 months follow up on atopic dermatitis . IRuthell Rummage, CMA, am acting as scribe for Sarina Ser, MD. Documentation: I have reviewed the above documentation for accuracy and completeness, and I agree with the above.  Sarina Ser, MD

## 2021-03-13 NOTE — Patient Instructions (Addendum)
Instructions for [facility] Medications  One or more of your medications was sent to the [facility] mail order compounding pharmacy. You will receive an email from them and can purchase the medicine through that link. It will then be mailed to your home at the address you confirmed. If for any reason you do not receive an email from them, please check your spam folder. If you still do not find the email, please let us know. [facility] phone number is [phone].    If You Need Anything After Your Visit  If you have any questions or concerns for your doctor, please call our main line at [phone] and press option 4 to reach your doctor's medical assistant. If no one answers, please leave a voicemail as directed and we will return your call as soon as possible. Messages left after 4 pm will be answered the following business day.   You may also send [place] a message via [place]. We typically respond to MyChart messages within 1-2 business days.  For prescription refills, please ask your pharmacy to contact our office. Our fax number is [phone].  If you have an urgent issue when the clinic is closed that cannot wait until the next business day, you can page your doctor at the number below.    Please note that while we do our best to be available for urgent issues outside of office hours, we are not available 24/7.   If you have an urgent issue and are unable to reach [place], you may choose to seek medical care at your doctor's office, retail clinic, urgent care center, or emergency room.  If you have a medical emergency, please immediately call 911 or go to the emergency department.  Pager Numbers  - Dr. Nehemiah Massed: [phone]  - Dr. Laurence Ferrari: [phone]  - Dr. Nicole Kindred: [phone]  In the event of inclement weather, please call our main line at [phone] for an update on the status of any delays or closures.  Dermatology Medication Tips: Please keep the boxes that  topical medications come in in order to help keep track of the instructions about where and how to use these. Pharmacies typically print the medication instructions only on the boxes and not directly on the medication tubes.   If your medication is too expensive, please contact our office at [phone] option 4 or send [place] a message through [place].   We are unable to tell what your co-pay for medications will be in advance as this is different depending on your insurance coverage. However, we may be able to find a substitute medication at lower cost or fill out paperwork to get insurance to cover a needed medication.   If a prior authorization is required to get your medication covered by your insurance company, please allow [place] 1-2 business days to complete this process.  Drug prices often vary depending on where the prescription is filled and some pharmacies may offer cheaper prices.  The website www.goodrx.com contains coupons for medications through different pharmacies. The prices here do not account for what the cost may be with help from insurance (it may be cheaper with your insurance), but the website can give you the price if you did not use any insurance.  - You can print the associated coupon and take it with your prescription to the pharmacy.  - You may also stop by our office during regular business hours and pick up a GoodRx coupon card.  - If you need your prescription  sent electronically to a different pharmacy, notify our office through [facility] or by phone at [phone] option 4.     Si Usted Necesita Algo Despus de Su Visita  Tambin puede enviarnos un mensaje a travs de Pharmacist, community. Por lo general respondemos a los mensajes de MyChart en el transcurso de 1 a 2 das hbiles.  Para renovar recetas, por favor pida a su farmacia que se ponga en contacto con nuestra oficina. Harland Dingwall de fax es [place] [phone].  Si tiene un asunto urgente cuando la clnica  est cerrada y que no puede esperar hasta el siguiente da hbil, puede llamar/localizar a su doctor(a) al nmero que aparece a continuacin.   Por favor, tenga en cuenta que aunque hacemos todo lo posible para estar disponibles para asuntos urgentes fuera del horario de [place], no estamos disponibles las 24 horas del da, los 7 das de la [place].   Si tiene un problema urgente y no puede comunicarse con nosotros, puede optar por buscar atencin mdica  en el consultorio de su doctor(a), en una clnica privada, en un centro de atencin urgente o en una sala de emergencias.  Si tiene Engineering geologist, por favor llame inmediatamente al 911 o vaya a la sala de emergencias.  Nmeros de bper  - Dr. Nehemiah Massed: [phone]  - Dra. Moye: [phone]  - Dra. Nicole Kindred: [phone]  En caso de inclemencias del [place], por favor llame a Johnsie Kindred principal al [phone] para una actualizacin sobre el [place] de cualquier retraso o cierre.  Consejos para la medicacin en dermatologa: Por favor, guarde las cajas en las que vienen los medicamentos de uso tpico para ayudarle a seguir las instrucciones sobre dnde y cmo usarlos. Las farmacias generalmente imprimen las instrucciones del medicamento slo en las cajas y no directamente en los tubos del [place].   Si su medicamento es muy caro, por favor, pngase en contacto con Zigmund Daniel llamando al [phone] y presione la opcin 4 o envenos un mensaje a travs de Pharmacist, community.   No podemos decirle cul ser su copago por los medicamentos por adelantado ya que esto es diferente dependiendo de la cobertura de su seguro. Sin embargo, es posible que podamos encontrar un medicamento sustituto a Electrical engineer un formulario para que el seguro cubra el medicamento que se considera necesario.   Si se requiere una autorizacin previa para que su compaa de seguros [place] su medicamento, por favor permtanos de 1 a 2 das hbiles para  completar este proceso.  Los precios de los medicamentos varan con frecuencia dependiendo del Environmental consultant de dnde se surte la receta y alguna farmacias pueden ofrecer precios ms baratos.  El sitio web www.goodrx.com tiene cupones para medicamentos de Airline pilot. Los precios aqu no tienen en cuenta lo que podra costar con la ayuda del seguro (puede ser ms barato con su seguro), pero el sitio web puede darle el precio si no utiliz Research scientist (physical sciences).  - Puede imprimir el cupn correspondiente y llevarlo con su receta a la farmacia.  - Tambin puede pasar por nuestra oficina durante el horario de atencin regular y Charity fundraiser una tarjeta de cupones de GoodRx.  - Si necesita que su receta se enve electrnicamente a una farmacia diferente, informe a nuestra oficina a travs de MyChart de [place] o por telfono llamando al [phone] y presione la opcin 4.

## 2021-03-26 ENCOUNTER — Encounter: Payer: Self-pay | Admitting: Dermatology

## 2021-04-28 ENCOUNTER — Other Ambulatory Visit: Payer: Self-pay

## 2021-04-28 ENCOUNTER — Ambulatory Visit: Payer: Managed Care, Other (non HMO) | Attending: Internal Medicine

## 2021-04-28 DIAGNOSIS — Z23 Encounter for immunization: Secondary | ICD-10-CM

## 2021-04-28 MED ORDER — PFIZER COVID-19 VAC BIVALENT 30 MCG/0.3ML IM SUSP
INTRAMUSCULAR | 0 refills | Status: DC
  Filled 2021-04-28: qty 0.3, 1d supply, fill #0

## 2021-04-28 NOTE — Progress Notes (Signed)
° °  Covid-19 Vaccination Clinic  Name:  DHALIA ZINGARO    MRN: [id number] DOB: 07-07-1955  04/28/2021  Ms. Moffitt was observed post Covid-19 immunization for 15 minutes without incident. She was provided with Vaccine Information Sheet and instruction to access the V-Safe system.   Ms. Fitzsimmons was instructed to call 911 with any severe reactions post vaccine: Difficulty breathing  Swelling of face and throat  A fast heartbeat  A bad rash all over body  Dizziness and weakness   Immunizations Administered     Name Date Dose VIS Date Route   Pfizer Covid-19 Vaccine Bivalent Booster 04/28/2021  9:11 AM 0.3 mL 12/21/2020 Intramuscular   Manufacturer: [place]   Lot: [id number]   [place]: [place], PharmD, MBA Clinical Acute Care Pharmacist

## 2021-06-05 ENCOUNTER — Other Ambulatory Visit: Payer: Self-pay | Admitting: Obstetrics & Gynecology

## 2021-06-05 ENCOUNTER — Other Ambulatory Visit: Payer: Self-pay

## 2021-06-05 ENCOUNTER — Ambulatory Visit
Admission: RE | Admit: 2021-06-05 | Discharge: 2021-06-05 | Disposition: A | Discharge status: Home / Self Care | Payer: Managed Care, Other (non HMO) | Source: Ambulatory Visit | Attending: Obstetrics & Gynecology | Admitting: Obstetrics & Gynecology

## 2021-06-05 DIAGNOSIS — Z1231 Encounter for screening mammogram for malignant neoplasm of breast: Secondary | ICD-10-CM

## 2021-06-05 DIAGNOSIS — R928 Other abnormal and inconclusive findings on diagnostic imaging of breast: Secondary | ICD-10-CM

## 2021-06-05 DIAGNOSIS — R921 Mammographic calcification found on diagnostic imaging of breast: Secondary | ICD-10-CM

## 2021-06-05 NOTE — Progress Notes (Signed)
The results of your recent mammogram reveals an area that needs further magnification views to determine if there is any concern or if it is just a false alarm. There is no suggestion of cancer, just a need for further images and evaluation by the radiologist. They should be contacting you, if not already, to schedule these additional mammogram pictures. If you have any questions, or if they have not yet contacted you, then please give [place] a call at [phone], so that we may help in this process. I know this seems worrisome, yet usually additional xrays clear up any suspicion for breast cancer.   I called and d/w pt  Sabrina Applebaum, MD, [facility], [facility] 06/05/2021  4:18 PM

## 2021-06-14 ENCOUNTER — Other Ambulatory Visit: Payer: Self-pay

## 2021-06-14 ENCOUNTER — Telehealth: Payer: Self-pay

## 2021-06-14 ENCOUNTER — Ambulatory Visit
Admission: RE | Admit: 2021-06-14 | Discharge: 2021-06-14 | Disposition: A | Discharge status: Home / Self Care | Payer: Managed Care, Other (non HMO) | Source: Ambulatory Visit | Attending: Obstetrics & Gynecology | Admitting: Obstetrics & Gynecology

## 2021-06-14 DIAGNOSIS — R928 Other abnormal and inconclusive findings on diagnostic imaging of breast: Secondary | ICD-10-CM | POA: Insufficient documentation

## 2021-06-14 DIAGNOSIS — R921 Mammographic calcification found on diagnostic imaging of breast: Secondary | ICD-10-CM | POA: Diagnosis present

## 2021-06-14 NOTE — Telephone Encounter (Signed)
Pt calling; had retake of mammogram today; needs bx; as soon as PH can release results they can do bx.  [phone]

## 2021-06-15 ENCOUNTER — Other Ambulatory Visit: Payer: Self-pay | Admitting: Obstetrics & Gynecology

## 2021-06-15 DIAGNOSIS — R921 Mammographic calcification found on diagnostic imaging of breast: Secondary | ICD-10-CM

## 2021-06-15 DIAGNOSIS — R928 Other abnormal and inconclusive findings on diagnostic imaging of breast: Secondary | ICD-10-CM

## 2021-06-29 ENCOUNTER — Other Ambulatory Visit: Payer: Self-pay

## 2021-06-29 ENCOUNTER — Ambulatory Visit
Admission: RE | Admit: 2021-06-29 | Discharge: 2021-06-29 | Disposition: A | Discharge status: Home / Self Care | Payer: Managed Care, Other (non HMO) | Source: Ambulatory Visit | Attending: Obstetrics & Gynecology | Admitting: Obstetrics & Gynecology

## 2021-06-29 ENCOUNTER — Inpatient Hospital Stay: Admission: RE | Admit: 2021-06-29 | Payer: Managed Care, Other (non HMO) | Source: Ambulatory Visit

## 2021-06-29 DIAGNOSIS — R921 Mammographic calcification found on diagnostic imaging of breast: Secondary | ICD-10-CM

## 2021-06-29 DIAGNOSIS — R928 Other abnormal and inconclusive findings on diagnostic imaging of breast: Secondary | ICD-10-CM | POA: Diagnosis present

## 2021-06-29 HISTORY — PX: BREAST BIOPSY: SHX20

## 2021-06-30 LAB — SURGICAL PATHOLOGY

## 2021-09-06 ENCOUNTER — Encounter: Payer: Self-pay | Admitting: Internal Medicine

## 2021-09-06 ENCOUNTER — Ambulatory Visit: Payer: Managed Care, Other (non HMO) | Admitting: Internal Medicine

## 2021-09-06 VITALS — BP 130/82 | HR 69 | Temp 98.7°F | Ht 64.0 in | Wt 259.0 lb

## 2021-09-06 DIAGNOSIS — U071 COVID-19: Secondary | ICD-10-CM | POA: Diagnosis not present

## 2021-09-06 DIAGNOSIS — R6889 Other general symptoms and signs: Secondary | ICD-10-CM | POA: Diagnosis not present

## 2021-09-06 LAB — POC COVID19 BINAXNOW: SARS Coronavirus 2 Ag: POSITIVE — AB

## 2021-09-06 MED ORDER — HYDROCODONE BIT-HOMATROP MBR 5-1.5 MG/5ML PO SOLN: 5.0000 mL | Freq: Every evening | ORAL | 0 refills | Status: DC | PRN

## 2021-09-06 NOTE — Progress Notes (Signed)
? ?Subjective:  ? ? Patient ID: Sabrina [name], female    DOB: 1955/07/24, 66 y.o.   MRN: [id number] ? ?HPI ?Here due to respiratory illness ? ?Symptoms started 6 days ago ?Worked 3rd shift and exposed to someone who just came back from a cruise ?Wasn't told at work that she tested positive for COVID then ? ?Started with "razor blade" feeling in her throat ?Went to walk in clinic the next day--COVID negative there ?Tested again and was negative ?Went back to work last night---but still not right ? ?Some cough--"getting on my nerves" ?No SOB ?No fever, chills or sweats ?Bad nasal congestion --affecting her breathing ? ?Taking aleve and drinking tea with honey ? ?Current Outpatient Medications on File Prior to Visit  ?Medication Sig Dispense Refill  ? clotrimazole-betamethasone (LOTRISONE) cream Apply 1 application topically 2 (two) times daily. 30 g 0  ? Crisaborole (EUCRISA) 2 % OINT Apply to affected area once to twice daily as needed. 60 g 6  ? furosemide (LASIX) 80 MG tablet TAKE 1 TABLET BY MOUTH DAILY AS NEEDED 30 tablet 11  ? metFORMIN (GLUCOPHAGE-XR) 500 MG 24 hr tablet Take 1 tablet (500 mg total) by mouth daily with breakfast. 90 tablet 3  ? naproxen sodium (ANAPROX) 220 MG tablet Take 1 tablet by mouth 2 (two) times daily as needed.    ? nystatin (NYSTATIN) powder Apply 1 application topically 3 (three) times daily as needed. 60 g 3  ? omeprazole (PRILOSEC) 20 MG capsule Take 1 capsule (20 mg total) by mouth daily. 90 capsule 3  ? triamterene-hydrochlorothiazide (MAXZIDE-25) 37.5-25 MG tablet TAKE 1 TABLET BY MOUTH DAILY 90 tablet 3  ? valACYclovir (VALTREX) 1000 MG tablet Take 2 tabs po at onset of outbreak then 2 more tabs 12h later. 30 tablet 11  ? ?No current facility-administered medications on file prior to visit.  ? ? ?No Known Allergies ? ?Past Medical History:  ?Diagnosis Date  ? Allergic rhinitis   ? Arthritis   ? Right knee, right elbow  ? BRCA negative 03/2020  ? MyRisk neg except MSH3 VUS  ?  Complex tear of medial meniscus of right knee 06/26/2016  ? Family history of breast cancer   ? GERD (gastroesophageal reflux disease)   ? Hypertension   ? Hypothyroidism 2008  ? Increased risk of breast cancer 03/2020  ? IBIS=21.9%/riskscore=19.2%  ? Obstructive sleep apnea   ? CPAP   --   NOT ABLE TO USE   ? PONV (postoperative nausea and vomiting)   ? after knee surgery  ? Viral meningitis 5/08  ? [place]  ? Wears dentures   ? partial upper and lower  ? ? ?Past Surgical History:  ?Procedure Laterality Date  ? ABDOMINAL HYSTERECTOMY  1982  ? Dr Vernie Ammons assisted by Dr Bary Castilla  ? APPENDECTOMY    ? teenager  ? BREAST BIOPSY Left 03/09/2015  ? benign/ Dr. Bary Castilla done in office NEG  ? BREAST BIOPSY Left 06/22/2020  ? [place] bx/ vision clip/ benign  ? BREAST BIOPSY Right 06/29/2021  ? affirm bx, coil mrker, path pending  ? BREAST EXCISIONAL BIOPSY Left 1999  ? [facility] NEG  ? CHONDROPLASTY Right 06/26/2016  ? Procedure: CHONDROPLASTY;  Surgeon: Marchia Bond, MD;  Location: [facility];  Service: Orthopedics;  Laterality: Right;  ? KNEE ARTHROSCOPY W/ LASER  11/2005  ? Left  ? KNEE ARTHROSCOPY WITH MEDIAL MENISECTOMY Right 06/26/2016  ? Procedure: RIGHT KNEE ARTHROSCOPY WITH MEDIAL MENISCECTOMY AND CHONDROPLASTY;  Surgeon: Marchia Bond, MD;  Location: [place];  Service: Orthopedics;  Laterality: Right;  ? MASS EXCISION N/A 02/19/2018  ? Procedure: EXCISION UPPER LIP MUCOCELE;  Surgeon: Carloyn Manner, MD;  Location: [facility];  Service: ENT;  Laterality: N/A;  sleep apnea  ? TONSILLECTOMY    ? as child  ? VESICOVAGINAL FISTULA CLOSURE W/ TAH  1987  ? Fibroids  ? ? ?Family History  ?Problem Relation Age of Onset  ? Breast cancer Mother 1  ?     BRCA neg  ? Hypertension Father   ? Breast cancer Maternal Aunt 34  ? Breast cancer Maternal Aunt 60  ? Breast cancer Maternal Aunt   ? Cancer Paternal Uncle   ?     pancreatic cancer  ? Prostate cancer Maternal Grandfather   ?  Hypertension Other   ?     both sides  ? Cancer Other   ?     maternal side  ? Coronary artery disease Neg Hx   ? Diabetes Neg Hx   ? Colon cancer Neg Hx   ? Liver cancer Neg Hx   ? ? ?Social History  ? ?Socioeconomic History  ? Marital status: Married  ?  Spouse name: Not on file  ? Number of children: 0  ? Years of education: Not on file  ? Highest education level: Not on file  ?Occupational History  ? Occupation: [employer] in [employer].   ?  Employer: [employer]  ? Occupation:    ?  Comment:    ?Tobacco Use  ? Smoking status: Never  ? Smokeless tobacco: Never  ?Vaping Use  ? Vaping Use: Never used  ?Substance and Sexual Activity  ? Alcohol use: Yes  ?  Comment: beer on weekends- occasional  ? Drug use: No  ? Sexual activity: Yes  ?  Birth control/protection: None  ?Other Topics Concern  ? Not on file  ?Social History Narrative  ? From [place].  ? Moved to [place] in high school then back here.    ? ?Social Determinants of Health  ? ?Financial Resource Strain: Not on file  ?Food Insecurity: Not on file  ?Transportation Needs: Not on file  ?Physical Activity: Not on file  ?Stress: Not on file  ?Social Connections: Not on file  ?Intimate Partner Violence: Not on file  ? ?Review of Systems ?Able to sleep ?No N/V ?Taste is off some--seems okay now ?Smell is off due to congestion ?No diarrhea ? ?   ?Objective:  ? Physical Exam ?Constitutional:   ?   Comments: Hoarse voice ?NAD  ?HENT:  ?   Head:  ?   Comments: No sinus tenderness ?   Right Ear: Tympanic membrane and ear canal normal.  ?   Left Ear: Tympanic membrane and ear canal normal.  ?   Nose:  ?   Comments: Mild congestion ?   Mouth/Throat:  ?   Comments: No sig injection ?Pulmonary:  ?   Effort: Pulmonary effort is normal.  ?   Breath sounds: Normal breath sounds. No wheezing or rales.  ?Musculoskeletal:  ?   Cervical back: Neck supple.  ?Lymphadenopathy:  ?   Cervical: No cervical adenopathy.  ?Neurological:  ?   Mental Status: She is alert.  ?  ? ? ? ? ?    ?Assessment & Plan:  ? ?

## 2021-09-06 NOTE — Assessment & Plan Note (Addendum)
Rapid test quickly/strongly positive here ?6 days out from symptoms---too late for antivirals ?Discussed analgesics ?Hydrocodone cough syrup to help sleep ?Out of work till next week ?

## 2021-09-11 ENCOUNTER — Ambulatory Visit: Payer: Managed Care, Other (non HMO) | Admitting: Internal Medicine

## 2021-09-11 ENCOUNTER — Encounter: Payer: Self-pay | Admitting: Internal Medicine

## 2021-09-11 ENCOUNTER — Telehealth: Payer: Self-pay | Admitting: Internal Medicine

## 2021-09-11 DIAGNOSIS — U071 COVID-19: Secondary | ICD-10-CM | POA: Diagnosis not present

## 2021-09-11 NOTE — Telephone Encounter (Signed)
Per appt notes pt already has appt with Dr Silvio Pate on 09/11/21 at 4 PM. Sending to Dr Silvio Pate and Larene Beach CMA.

## 2021-09-11 NOTE — Telephone Encounter (Signed)
We will probably need to send her to check for a DVT

## 2021-09-11 NOTE — Telephone Encounter (Signed)
Patient testing positive for covid on 5.17.23, still feels bad, no voice, lungs feel tight, her legs hurt, concerned about blood clot  Transferred call to Access Nurse

## 2021-09-11 NOTE — Progress Notes (Signed)
Subjective:    Patient ID: Sabrina [name], female    DOB: [birth year], 66 y.o.   MRN: [id number]  HPI Here due to ongoing COVID issues  Legs are sore--but no swelling Still coughing--though the syrup does help. No sputum Worried about her lungs No SOB No fevers No sore throat now---voice still off No chest pain  Just lost brother---leukemia (it was a surprise) This has added to her stress and not feeling well  Current Outpatient Medications on File Prior to Visit  Medication Sig Dispense Refill   clotrimazole-betamethasone (LOTRISONE) cream Apply 1 application topically 2 (two) times daily. 30 g 0   Crisaborole (EUCRISA) 2 % OINT Apply to affected area once to twice daily as needed. 60 g 6   furosemide (LASIX) 80 MG tablet TAKE 1 TABLET BY MOUTH DAILY AS NEEDED 30 tablet 11   HYDROcodone bit-homatropine (HYCODAN) 5-1.5 MG/5ML syrup Take 5 mLs by mouth at bedtime as needed for cough. 120 mL 0   metFORMIN (GLUCOPHAGE-XR) 500 MG 24 hr tablet Take 1 tablet (500 mg total) by mouth daily with breakfast. 90 tablet 3   naproxen sodium (ANAPROX) 220 MG tablet Take 1 tablet by mouth 2 (two) times daily as needed.     nystatin (NYSTATIN) powder Apply 1 application topically 3 (three) times daily as needed. 60 g 3   omeprazole (PRILOSEC) 20 MG capsule Take 1 capsule (20 mg total) by mouth daily. 90 capsule 3   triamterene-hydrochlorothiazide (MAXZIDE-25) 37.5-25 MG tablet TAKE 1 TABLET BY MOUTH DAILY 90 tablet 3   valACYclovir (VALTREX) 1000 MG tablet Take 2 tabs po at onset of outbreak then 2 more tabs 12h later. 30 tablet 11   No current facility-administered medications on file prior to visit.    No Known Allergies  Past Medical History:  Diagnosis Date   Allergic rhinitis    Arthritis    Right knee, right elbow   BRCA negative 03/2020   MyRisk neg except MSH3 VUS   Complex tear of medial meniscus of right knee 06/26/2016   Family history of breast cancer    GERD (gastroesophageal  reflux disease)    Hypertension    Hypothyroidism 2008   Increased risk of breast cancer 03/2020   IBIS=21.9%/riskscore=19.2%   Obstructive sleep apnea    CPAP   --   NOT ABLE TO USE    PONV (postoperative nausea and vomiting)    after knee surgery   Viral meningitis 5/08   [facility]   Wears dentures    partial upper and lower    Past Surgical History:  Procedure Laterality Date   ABDOMINAL HYSTERECTOMY  1982   Dr Vernie Ammons assisted by Dr Bary Castilla   APPENDECTOMY     teenager   BREAST BIOPSY Left 03/09/2015   benign/ Dr. Bary Castilla done in office NEG   BREAST BIOPSY Left 06/22/2020   [place] bx/ vision clip/ benign   BREAST BIOPSY Right 06/29/2021   affirm bx, coil mrker, path pending   BREAST EXCISIONAL BIOPSY Left 1999   [facility] NEG   CHONDROPLASTY Right 06/26/2016   Procedure: CHONDROPLASTY;  Surgeon: Marchia Bond, MD;  Location: [facility];  Service: Orthopedics;  Laterality: Right;   KNEE ARTHROSCOPY W/ LASER  11/2005   Left   KNEE ARTHROSCOPY WITH MEDIAL MENISECTOMY Right 06/26/2016   Procedure: RIGHT KNEE ARTHROSCOPY WITH MEDIAL MENISCECTOMY AND CHONDROPLASTY;  Surgeon: Marchia Bond, MD;  Location: [facility];  Service: Orthopedics;  Laterality: Right;  MASS EXCISION N/A 02/19/2018   Procedure: EXCISION UPPER LIP MUCOCELE;  Surgeon: Carloyn Manner, MD;  Location: [facility];  Service: ENT;  Laterality: N/A;  sleep apnea   TONSILLECTOMY     as child   VESICOVAGINAL FISTULA CLOSURE W/ TAH  1987   Fibroids    Family History  Problem Relation Age of Onset   Breast cancer Mother 95       BRCA neg   Hypertension Father    Breast cancer Maternal Aunt 77   Breast cancer Maternal Aunt 60   Breast cancer Maternal Aunt    Cancer Paternal Uncle        pancreatic cancer   Prostate cancer Maternal Grandfather    Hypertension Other        both sides   Cancer Other        maternal side   Coronary artery disease Neg Hx    Diabetes  Neg Hx    Colon cancer Neg Hx    Liver cancer Neg Hx     Social History   Socioeconomic History   Marital status: Married    Spouse name: Not on file   Number of children: 0   Years of education: Not on file   Highest education level: Not on file  Occupational History   Occupation: Ward in Psychologist, counselling.     Employer: [employer]   Occupation:      Comment:    Tobacco Use   Smoking status: Never    Passive exposure: Past   Smokeless tobacco: Never  Vaping Use   Vaping Use: Never used  Substance and Sexual Activity   Alcohol use: Yes    Comment: beer on weekends- occasional   Drug use: No   Sexual activity: Yes    Birth control/protection: None  Other Topics Concern   Not on file  Social History Narrative   From [place].   Moved to [place] in high school then back here.     Social Determinants of Health   Financial Resource Strain: Not on file  Food Insecurity: Not on file  Transportation Needs: Not on file  Physical Activity: Not on file  Stress: Not on file  Social Connections: Not on file  Intimate Partner Violence: Not on file   Review of Systems No N/V---some vague abdominal pain yesterday Eating okay---appetite is off Is able to rest at night    Objective:   Physical Exam Constitutional:      General: She is not in acute distress.    Comments: Still hoarse   HENT:     Mouth/Throat:     Pharynx: No oropharyngeal exudate or posterior oropharyngeal erythema.  Pulmonary:     Effort: Pulmonary effort is normal.     Breath sounds: Normal breath sounds. No wheezing or rales.  Musculoskeletal:     Cervical back: Neck supple.     Comments: Thick calves without pitting Slight tenderness but no asymmetry  Lymphadenopathy:     Cervical: No cervical adenopathy.  Neurological:     Mental Status: She is alert.           Assessment & Plan:

## 2021-09-11 NOTE — Assessment & Plan Note (Signed)
Doing better but still doesn't feel well Will give out of work note till 5/29  No evidence of secondary pneumonia, sinusitis or DVT Reassured Still needs to rest to get back to full health

## 2021-09-11 NOTE — Telephone Encounter (Signed)
Sabrina [name] - Client TELEPHONE ADVICE RECORD [facility] Patient Name: Sabrina [name] Gender: Female DOB: 1956/03/13 Age: 66 Y 9 M 21 D Return Phone Number: [phone] (Primary), [phone] (Secondary) Address: City/ State/ Zip: [place]  [address] Client [name] - Client Client Site [name] Contact Type Call Who Is Calling Patient / Member / Family / Caregiver Call Type Triage / Clinical Relationship To Patient Self Return Phone Number [phone] (Primary) Chief Complaint CHEST PAIN - pain, pressure, heaviness or tightness Reason for Call Symptomatic / Request for [place] states that she has Covid. She has chest tightness, not much of a voice, and leg pain. Translation No Nurse Assessment Nurse: Windle Guard, RN, Lesa Date/Time (Eastern Time): 09/11/2021 8:34:27 AM Confirm and document reason for call. If symptomatic, describe symptoms. ---Caller states she has bilateral leg pain and she is hoarse. She tested positive on 09/06/21. Denies chest pain and tightness in her chest Does the patient have any new or worsening symptoms? ---Yes Will a triage be completed? ---Yes Related visit to physician within the last 2 weeks? ---Yes Does the PT have any chronic conditions? (i.e. diabetes, asthma, this includes High risk factors for pregnancy, etc.) ---No Is this a behavioral health or substance abuse call? ---No Guidelines Guideline Title Affirmed Question Affirmed Notes Nurse Date/Time (Eastern Time) COVID-19 - Diagnosed or Suspected [1] HIGH RISK for severe COVID complications (e.g., weak immune system, age > 29 years, obesity with BMI 30 or higher, pregnant, chronic lung disease or other chronic medical condition) AND [2] COVID symptoms (e.g., cough, fever) Conner, RN, Lesa 09/11/2021 8:37:24 AM PLEASE NOTE: All timestamps contained  within this report are represented as [place] Standard Time. CONFIDENTIALTY NOTICE: This fax transmission is intended only for the addressee. It contains information that is legally privileged, confidential or otherwise protected from use or disclosure. If you are not the intended recipient, you are strictly prohibited from reviewing, disclosing, copying using or disseminating any of this information or taking any action in reliance on or regarding this information. If you have received this fax in error, please notify us immediately by telephone so that we can arrange for its return to [place]. Phone: [phone], Toll-Free: [phone], Fax: [phone] Page: 2 of 2 Call Id: [id number] Guidelines Guideline Title Affirmed Question Affirmed Notes Nurse Date/Time Eilene Ghazi Time) (Exceptions: Already seen by PCP and no new or worsening symptoms.) Disp. Time Eilene Ghazi Time) Disposition Final User 09/11/2021 8:33:19 AM Send to Urgent Queue Windy Canny 09/11/2021 8:43:33 AM Call PCP within 24 Hours Yes Conner, RN, Emmaline Kluver Caller Disagree/Comply Comply Caller Understands Yes PreDisposition [place] Advice Given Per Guideline CALL PCP WITHIN 24 HOURS: * You need to discuss this with your doctor (or NP/PA) within the next 24 hours. GENERAL CARE ADVICE FOR COVID-19 SYMPTOMS: * The symptoms are generally treated the same whether you have COVID-19, influenza or some other respiratory virus. * Cough: Use cough drops. * Feeling dehydrated: Drink extra liquids. If the air in your home is dry, use a humidifier. * Fever: For fever over 101 F (38.3 C), take acetaminophen every 4 to 6 hours (Adults 650 mg) OR ibuprofen every 6 to 8 hours (Adults 400 mg). Before taking any medicine, read all the instructions on the package. Do not take aspirin unless your doctor has prescribed it for you. * Muscle aches, headache, and other pains: Often this comes and goes with the fever. Take  acetaminophen every 4 to 6 hours  (Adults 650 mg) OR ibuprofen every 6 to 8 hours (Adults 400 mg). Before taking any medicine, read all the instructions on the package. * HOME REMEDY - HARD CANDY: Hard candy works just as well as overthe-counter cough drops. People who have diabetes should use sugar-free candy. * HOME REMEDY - HONEY: This old home remedy has been shown to help decrease coughing at night. The adult dosage is 2 teaspoons (10 ml) at bedtime. COUGH SYRUP WITH DEXTROMETHORPHAN - EXTRA NOTES AND WARNINGS: * Do not try to completely stop coughs that produce mucus and phlegm. * Coughing is helpful. It brings up the mucus from the lungs and helps prevent pneumonia. * STAY HOME A MINIMUM OF 5 DAYS: People with MILD COVID-19 can STOP HOME ISOLATION AFTER 5 DAYS if (1) fever has been gone for 24 hours (without using fever medicine) AND (2) symptoms are better. Continue to wear a well-fitted mask for a full 10 days when around others. COVID-19 - HOW TO PROTECT OTHERS - WHEN YOU ARE SICK WITH COVID-19: * WEAR A MASK FOR 10 DAYS: Wear a well-fitted mask for 10 full days any time you are around others inside your home or in public. Do not go to places where you are unable to wear a mask. * AVOID TRAVEL: Avoid travel for 10 days after you tested positive for COVID-19. * [place] HANDS OFTEN: Wash hands often with soap and water. After coughing or sneezing are important times. If soap and water are not available, use an alcohol-based hand sanitizer with at least 60% alcohol, covering all surfaces of your hands and rubbing them together until they feel dry. Avoid touching your eyes, nose, and mouth with unwashed hands. CALL BACK IF: * You become worse CARE ADVICE given per COVID-19 - DIAGNOSED OR SUSPECTED (Adult) guideline

## 2021-09-25 ENCOUNTER — Ambulatory Visit: Payer: Managed Care, Other (non HMO) | Admitting: Dermatology

## 2021-10-04 ENCOUNTER — Ambulatory Visit: Payer: Managed Care, Other (non HMO) | Admitting: Dermatology

## 2021-10-04 DIAGNOSIS — B009 Herpesviral infection, unspecified: Secondary | ICD-10-CM

## 2021-10-04 DIAGNOSIS — L2081 Atopic neurodermatitis: Secondary | ICD-10-CM | POA: Diagnosis not present

## 2021-10-04 DIAGNOSIS — L980 Pyogenic granuloma: Secondary | ICD-10-CM

## 2021-10-04 MED ORDER — VALACYCLOVIR HCL 1 G PO TABS: 1000.0000 mg | ORAL_TABLET | ORAL | 11 refills | Status: DC

## 2021-10-04 MED ORDER — EUCRISA 2 % EX OINT: 1.0000 | TOPICAL_OINTMENT | CUTANEOUS | 11 refills | Status: AC

## 2021-10-04 NOTE — Progress Notes (Signed)
   Follow-Up Visit   Subjective  Sabrina [name] is a 66 y.o. female who presents for the following: Eczema (Bil elbows, back, Eucrisa oint bid prn flares) and check spot (Buttocks, 5 days, pt has taken Valtrex in past but does not have any).  The following portions of the chart were reviewed this encounter and updated as appropriate:   Tobacco  Allergies  Meds  Problems  Med Hx  Surg Hx  Fam Hx     Review of Systems:  No other skin or systemic complaints except as noted in HPI or Assessment and Plan.  Objective  Well appearing patient in no apparent distress; mood and affect are within normal limits.  A focused examination was performed including bil elbows, back. Relevant physical exam findings are noted in the Assessment and Plan.  R buttocks crease No exam today  L index finger Finger clear   Assessment & Plan  Atopic neurodermatitis bil elbows, back Chronic and persistent condition with duration or expected duration over one year. Condition is symptomatic / bothersome to patient. Not to goal, but improved. Atopic dermatitis (eczema) is a chronic, relapsing, pruritic condition that can significantly affect quality of life. It is often associated with allergic rhinitis and/or asthma and can require treatment with topical medications, phototherapy, or in severe cases biologic injectable medication (Dupixent; Adbry) or Oral JAK inhibitors.   Crisaborole (EUCRISA) 2 % OINT - bil elbows, back Apply 1 Application topically as directed. Qd to bid to aa eczema on body prn flares  HSV (herpes simplex virus) infection R buttocks crease Recurrent and flared today Herpes Simplex Virus = Cold Sores = Fever Blisters is a chronic recurring blistering; scabbing sore-producing viral infection that is recurrent usually in the same area triggered by stress, sun/UV exposure and trauma.  It is infectious and can be spread from person to person by direct contact.  It is not curable, but is  treatable with topical and oral medication.  Start Valtrex 1gr 2 po bid  for 1 week, then on future onset of symptoms take 2 po at onset of symptoms and 2 po 12 hours later  valACYclovir (VALTREX) 1000 MG tablet - R buttocks crease Take 1 tablet (1,000 mg total) by mouth as directed. Take 2 po at onset of symptoms then take 2 po 12 hours later  Pyogenic granuloma L index finger Bx proven 09/2020 Resolved  Return in about 1 year (around 10/05/2022) for Atopic Derm.  I, Othelia Pulling, RMA, am acting as scribe for Sarina Ser, MD . Documentation: I have reviewed the above documentation for accuracy and completeness, and I agree with the above.  Sarina Ser, MD

## 2021-10-04 NOTE — Patient Instructions (Addendum)
For this episode of sore on buttocks Take Valtrex 1 gram 2 pills now and then 2 pills 12 hours later, then tomorrow take 1 pill 2 times a day for 7 days   Due to recent changes in healthcare laws, you may see results of your pathology and/or laboratory studies on MyChart before the doctors have had a chance to review them. We understand that in some cases there may be results that are confusing or concerning to you. Please understand that not all results are received at the same time and often the doctors may need to interpret multiple results in order to provide you with the best plan of care or course of treatment. Therefore, we ask that you please give [place] 2 business days to thoroughly review all your results before contacting the office for clarification. Should we see a critical lab result, you will be contacted sooner.   If You Need Anything After Your Visit  If you have any questions or concerns for your doctor, please call our main line at [phone] and press option 4 to reach your doctor's medical assistant. If no one answers, please leave a voicemail as directed and we will return your call as soon as possible. Messages left after 4 pm will be answered the following business day.   You may also send [place] a message via [place]. We typically respond to MyChart messages within 1-2 business days.  For prescription refills, please ask your pharmacy to contact our office. Our fax number is [phone].  If you have an urgent issue when the clinic is closed that cannot wait until the next business day, you can page your doctor at the number below.    Please note that while we do our best to be available for urgent issues outside of office hours, we are not available 24/7.   If you have an urgent issue and are unable to reach [place], you may choose to seek medical care at your doctor's office, retail clinic, urgent care center, or emergency room.  If you have a medical emergency, please  immediately call 911 or go to the emergency department.  Pager Numbers  - Dr. Nehemiah Massed: [phone]  - Dr. Laurence Ferrari: [phone]  - Dr. Nicole Kindred: [phone]  In the event of inclement weather, please call our main line at [phone] for an update on the status of any delays or closures.  Dermatology Medication Tips: Please keep the boxes that topical medications come in in order to help keep track of the instructions about where and how to use these. Pharmacies typically print the medication instructions only on the boxes and not directly on the medication tubes.   If your medication is too expensive, please contact our office at [phone] option 4 or send [place] a message through [place].   We are unable to tell what your co-pay for medications will be in advance as this is different depending on your insurance coverage. However, we may be able to find a substitute medication at lower cost or fill out paperwork to get insurance to cover a needed medication.   If a prior authorization is required to get your medication covered by your insurance company, please allow [place] 1-2 business days to complete this process.  Drug prices often vary depending on where the prescription is filled and some pharmacies may offer cheaper prices.  The website www.goodrx.com contains coupons for medications through different pharmacies. The prices here do not account for what the cost may be with help from  insurance (it may be cheaper with your insurance), but the website can give you the price if you did not use any insurance.  - You can print the associated coupon and take it with your prescription to the pharmacy.  - You may also stop by our office during regular business hours and pick up a GoodRx coupon card.  - If you need your prescription sent electronically to a different pharmacy, notify our office through [facility] or by phone at [phone] option 4.     Si Usted Necesita Algo Despus  de Su Visita  Tambin puede enviarnos un mensaje a travs de Pharmacist, community. Por lo general respondemos a los mensajes de MyChart en el transcurso de 1 a 2 das hbiles.  Para renovar recetas, por favor pida a su farmacia que se ponga en contacto con nuestra oficina. Harland Dingwall de fax es [place] [phone].  Si tiene un asunto urgente cuando la clnica est cerrada y que no puede esperar hasta el siguiente da hbil, puede llamar/localizar a su doctor(a) al nmero que aparece a continuacin.   Por favor, tenga en cuenta que aunque hacemos todo lo posible para estar disponibles para asuntos urgentes fuera del horario de [place], no estamos disponibles las 24 horas del da, los 7 das de la [place].   Si tiene un problema urgente y no puede comunicarse con nosotros, puede optar por buscar atencin mdica  en el consultorio de su doctor(a), en una clnica privada, en un centro de atencin urgente o en una sala de emergencias.  Si tiene Engineering geologist, por favor llame inmediatamente al 911 o vaya a la sala de emergencias.  Nmeros de bper  - Dr. Nehemiah Massed: [phone]  - Dra. Moye: [phone]  - Dra. Nicole Kindred: [phone]  En caso de inclemencias del [place], por favor llame a Johnsie Kindred principal al [phone] para una actualizacin sobre el [place] de cualquier retraso o cierre.  Consejos para la medicacin en dermatologa: Por favor, guarde las cajas en las que vienen los medicamentos de uso tpico para ayudarle a seguir las instrucciones sobre dnde y cmo usarlos. Las farmacias generalmente imprimen las instrucciones del medicamento slo en las cajas y no directamente en los tubos del [place].   Si su medicamento es muy caro, por favor, pngase en contacto con Zigmund Daniel llamando al [phone] y presione la opcin 4 o envenos un mensaje a travs de Pharmacist, community.   No podemos decirle cul ser su copago por los medicamentos por adelantado ya que esto es diferente dependiendo  de la cobertura de su seguro. Sin embargo, es posible que podamos encontrar un medicamento sustituto a Electrical engineer un formulario para que el seguro cubra el medicamento que se considera necesario.   Si se requiere una autorizacin previa para que su compaa de seguros [place] su medicamento, por favor permtanos de 1 a 2 das hbiles para completar este proceso.  Los precios de los medicamentos varan con frecuencia dependiendo del Environmental consultant de dnde se surte la receta y alguna farmacias pueden ofrecer precios ms baratos.  El sitio web www.goodrx.com tiene cupones para medicamentos de Airline pilot. Los precios aqu no tienen en cuenta lo que podra costar con la ayuda del seguro (puede ser ms barato con su seguro), pero el sitio web puede darle el precio si no utiliz Research scientist (physical sciences).  - Puede imprimir el cupn correspondiente y llevarlo con su receta a la farmacia.  - Tambin puede pasar por nuestra oficina durante el horario de  atencin regular y recoger una tarjeta de cupones de GoodRx.  - Si necesita que su receta se enve electrnicamente a una farmacia diferente, informe a nuestra oficina a travs de MyChart de [place] o por telfono llamando al [phone] y presione la opcin 4.  

## 2021-10-05 ENCOUNTER — Encounter: Payer: Self-pay | Admitting: Dermatology

## 2021-10-31 ENCOUNTER — Encounter: Payer: Self-pay | Admitting: Internal Medicine

## 2021-10-31 ENCOUNTER — Ambulatory Visit: Payer: Managed Care, Other (non HMO) | Admitting: Internal Medicine

## 2021-10-31 DIAGNOSIS — M542 Cervicalgia: Secondary | ICD-10-CM

## 2021-10-31 NOTE — Progress Notes (Signed)
Subjective:    Patient ID: Sabrina [name], female    DOB: Oct 13, 1955, 66 y.o.   MRN: [id number]  HPI Here due to head pressure  She does think she recovered from the [place] to ENT after for persistent throat symptoms and got steroid course and increase in omeprazole for a while  Started with discomfort on the top of her head around 9 days ago Then moved to the side of her right neck Got worse--couldn't even rotate her head 2 aleve did help Then recurred a couple of days ago--but just on the top of her head Head "felt like a ton----so heavy"--better after a nap  Doesn't remember any new tasks or strains  Current Outpatient Medications on File Prior to Visit  Medication Sig Dispense Refill   clotrimazole-betamethasone (LOTRISONE) cream Apply 1 application topically 2 (two) times daily. 30 g 0   Crisaborole (EUCRISA) 2 % OINT Apply 1 Application topically as directed. Qd to bid to aa eczema on body prn flares 100 g 11   furosemide (LASIX) 80 MG tablet TAKE 1 TABLET BY MOUTH DAILY AS NEEDED 30 tablet 11   HYDROcodone bit-homatropine (HYCODAN) 5-1.5 MG/5ML syrup Take 5 mLs by mouth at bedtime as needed for cough. 120 mL 0   metFORMIN (GLUCOPHAGE-XR) 500 MG 24 hr tablet Take 1 tablet (500 mg total) by mouth daily with breakfast. 90 tablet 3   naproxen sodium (ANAPROX) 220 MG tablet Take 1 tablet by mouth 2 (two) times daily as needed.     nystatin (NYSTATIN) powder Apply 1 application topically 3 (three) times daily as needed. 60 g 3   omeprazole (PRILOSEC) 20 MG capsule Take 1 capsule (20 mg total) by mouth daily. 90 capsule 3   triamterene-hydrochlorothiazide (MAXZIDE-25) 37.5-25 MG tablet TAKE 1 TABLET BY MOUTH DAILY 90 tablet 3   valACYclovir (VALTREX) 1000 MG tablet Take 1 tablet (1,000 mg total) by mouth as directed. Take 2 po at onset of symptoms then take 2 po 12 hours later 30 tablet 11   No current facility-administered medications on file prior to visit.    No Known  Allergies  Past Medical History:  Diagnosis Date   Allergic rhinitis    Arthritis    Right knee, right elbow   BRCA negative 03/2020   MyRisk neg except MSH3 VUS   Complex tear of medial meniscus of right knee 06/26/2016   Family history of breast cancer    GERD (gastroesophageal reflux disease)    Hypertension    Hypothyroidism 2008   Increased risk of breast cancer 03/2020   IBIS=21.9%/riskscore=19.2%   Obstructive sleep apnea    CPAP   --   NOT ABLE TO USE    PONV (postoperative nausea and vomiting)    after knee surgery   Viral meningitis 5/08   [facility]   Wears dentures    partial upper and lower    Past Surgical History:  Procedure Laterality Date   ABDOMINAL HYSTERECTOMY  1982   Dr Vernie Ammons assisted by Dr Bary Castilla   APPENDECTOMY     teenager   BREAST BIOPSY Left 03/09/2015   benign/ Dr. Bary Castilla done in office NEG   BREAST BIOPSY Left 06/22/2020   [place] bx/ vision clip/ benign   BREAST BIOPSY Right 06/29/2021   affirm bx, coil mrker, path pending   BREAST EXCISIONAL BIOPSY Left 1999   [facility] NEG   CHONDROPLASTY Right 06/26/2016   Procedure: CHONDROPLASTY;  Surgeon: Marchia Bond, MD;  Location: [facility];  Service: Orthopedics;  Laterality: Right;   KNEE ARTHROSCOPY W/ LASER  11/2005   Left   KNEE ARTHROSCOPY WITH MEDIAL MENISECTOMY Right 06/26/2016   Procedure: RIGHT KNEE ARTHROSCOPY WITH MEDIAL MENISCECTOMY AND CHONDROPLASTY;  Surgeon: Marchia Bond, MD;  Location: [facility];  Service: Orthopedics;  Laterality: Right;   MASS EXCISION N/A 02/19/2018   Procedure: EXCISION UPPER LIP MUCOCELE;  Surgeon: Carloyn Manner, MD;  Location: [facility];  Service: ENT;  Laterality: N/A;  sleep apnea   TONSILLECTOMY     as child   VESICOVAGINAL FISTULA CLOSURE W/ TAH  1987   Fibroids    Family History  Problem Relation Age of Onset   Breast cancer Mother 60       BRCA neg   Hypertension Father    Leukemia Brother     Prostate cancer Maternal Grandfather    Breast cancer Maternal Aunt 61   Breast cancer Maternal Aunt 60   Breast cancer Maternal Aunt    Cancer Paternal Uncle        pancreatic cancer   Hypertension Other        both sides   Cancer Other        maternal side   Coronary artery disease Neg Hx    Diabetes Neg Hx    Colon cancer Neg Hx    Liver cancer Neg Hx     Social History   Socioeconomic History   Marital status: Married    Spouse name: Not on file   Number of children: 0   Years of education: Not on file   Highest education level: Not on file  Occupational History   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     Employer: [employer]   Occupation:      Comment:    Tobacco Use   Smoking status: Never    Passive exposure: Past   Smokeless tobacco: Never  Vaping Use   Vaping Use: Never used  Substance and Sexual Activity   Alcohol use: Yes    Comment: beer on weekends- occasional   Drug use: No   Sexual activity: Yes    Birth control/protection: None  Other Topics Concern   Not on file  Social History Narrative   From [place].   Moved to [place] in high school then back here.     Social Determinants of Health   Financial Resource Strain: Not on file  Food Insecurity: Not on file  Transportation Needs: Not on file  Physical Activity: Not on file  Stress: Not on file  Social Connections: Not on file  Intimate Partner Violence: Not on file   Review of Systems No vision changes No fever    Objective:   Physical Exam Constitutional:      Appearance: Normal appearance.  HENT:     Right Ear: Tympanic membrane and ear canal normal.     Left Ear: Tympanic membrane and ear canal normal.  Musculoskeletal:     Cervical back: Normal range of motion and neck supple. No tenderness.  Lymphadenopathy:     Cervical: No cervical adenopathy.  Neurological:     Mental Status: She is alert.            Assessment & Plan:

## 2021-10-31 NOTE — Assessment & Plan Note (Signed)
And on top of head Clearly seems muscular No temporal symptoms, systemic symptoms Reassured--doesn't seem to be anything serious Can try aleve or heat prn if recurs

## 2022-01-02 ENCOUNTER — Ambulatory Visit: Payer: Managed Care, Other (non HMO) | Admitting: Dermatology

## 2022-01-02 ENCOUNTER — Encounter: Payer: Self-pay | Admitting: Dermatology

## 2022-01-02 DIAGNOSIS — B351 Tinea unguium: Secondary | ICD-10-CM | POA: Diagnosis not present

## 2022-01-02 DIAGNOSIS — B353 Tinea pedis: Secondary | ICD-10-CM

## 2022-01-02 MED ORDER — CICLOPIROX OLAMINE 0.77 % EX CREA: TOPICAL_CREAM | CUTANEOUS | 3 refills | Status: DC

## 2022-01-02 NOTE — Progress Notes (Signed)
   Follow-Up Visit   Subjective  Sabrina [name] is a 66 y.o. female who presents for the following: Rash (Feet. Dur: 1 week. Broke out in a rash, peeling. Itching, but has improved. Using Vaseline Jelly. Improved since went for pedicure recently ).   The following portions of the chart were reviewed this encounter and updated as appropriate:  Tobacco  Allergies  Meds  Problems  Med Hx  Surg Hx  Fam Hx      Review of Systems: No other skin or systemic complaints except as noted in HPI or Assessment and Plan.   Objective  Well appearing patient in no apparent distress; mood and affect are within normal limits.  A focused examination was performed including feet. Relevant physical exam findings are noted in the Assessment and Plan.  feet Scaling and maceration web spaces and over distal and lateral soles.   Right Hallux Toe Nail Plate Thickening and subungual debris   Assessment & Plan  Tinea pedis of right foot feet  Exam most c/w tinea pedis, though she has history of eczema  Start Ciclopirox cream Apply twice daily until 1 week post clearance. Continue to use once a week for maintenance/prevention of recurrence.   If not improving by next Tuesday call office. Will add prescription topical corticosteroid.   ciclopirox (LOPROX) 0.77 % cream - feet Apply twice daily until 1 week post clearance, then once a week  Tinea unguium Right Hallux Toe Nail Plate  Defer treatment. Use ciclopirox cream weekly for prevention once rash clear.   Return if symptoms worsen or fail to improve.  I, Sabrina [name], CMA, am acting as scribe for Forest Gleason, MD.  Documentation: I have reviewed the above documentation for accuracy and completeness, and I agree with the above.  Forest Gleason, MD

## 2022-01-02 NOTE — Patient Instructions (Addendum)
Ciclopirox cream Apply twice daily until 1 week post clearance. Continue to use once a week for maintenance.   If not improving by next Tuesday call office. Will add prescription topical corticosteroid.    Due to recent changes in healthcare laws, you may see results of your pathology and/or laboratory studies on MyChart before the doctors have had a chance to review them. We understand that in some cases there may be results that are confusing or concerning to you. Please understand that not all results are received at the same time and often the doctors may need to interpret multiple results in order to provide you with the best plan of care or course of treatment. Therefore, we ask that you please give [place] 2 business days to thoroughly review all your results before contacting the office for clarification. Should we see a critical lab result, you will be contacted sooner.   If You Need Anything After Your Visit  If you have any questions or concerns for your doctor, please call our main line at [phone] and press option 4 to reach your doctor's medical assistant. If no one answers, please leave a voicemail as directed and we will return your call as soon as possible. Messages left after 4 pm will be answered the following business day.   You may also send [place] a message via [place]. We typically respond to MyChart messages within 1-2 business days.  For prescription refills, please ask your pharmacy to contact our office. Our fax number is [phone].  If you have an urgent issue when the clinic is closed that cannot wait until the next business day, you can page your doctor at the number below.    Please note that while we do our best to be available for urgent issues outside of office hours, we are not available 24/7.   If you have an urgent issue and are unable to reach [place], you may choose to seek medical care at your doctor's office, retail clinic, urgent care center, or emergency  room.  If you have a medical emergency, please immediately call 911 or go to the emergency department.  Pager Numbers  - Dr. Nehemiah Massed: [phone]  - Dr. Laurence Ferrari: [phone]  - Dr. Nicole Kindred: [phone]  In the event of inclement weather, please call our main line at [phone] for an update on the status of any delays or closures.  Dermatology Medication Tips: Please keep the boxes that topical medications come in in order to help keep track of the instructions about where and how to use these. Pharmacies typically print the medication instructions only on the boxes and not directly on the medication tubes.   If your medication is too expensive, please contact our office at [phone] option 4 or send [place] a message through [employer].   We are unable to tell what your co-pay for medications will be in advance as this is different depending on your insurance coverage. However, we may be able to find a substitute medication at lower cost or fill out paperwork to get insurance to cover a needed medication.   If a prior authorization is required to get your medication covered by your insurance company, please allow [place] 1-2 business days to complete this process.  Drug prices often vary depending on where the prescription is filled and some pharmacies may offer cheaper prices.  The website www.goodrx.com contains coupons for medications through different pharmacies. The prices here do not account for what the cost may be with help  from insurance (it may be cheaper with your insurance), but the website can give you the price if you did not use any insurance.  - You can print the associated coupon and take it with your prescription to the pharmacy.  - You may also stop by our office during regular business hours and pick up a GoodRx coupon card.  - If you need your prescription sent electronically to a different pharmacy, notify our office through [facility] or by phone at [phone]  option 4.     Si Usted Necesita Algo Despus de Su Visita  Tambin puede enviarnos un mensaje a travs de Pharmacist, community. Por lo general respondemos a los mensajes de MyChart en el transcurso de 1 a 2 das hbiles.  Para renovar recetas, por favor pida a su farmacia que se ponga en contacto con nuestra oficina. Harland Dingwall de fax es [place] [phone].  Si tiene un asunto urgente cuando la clnica est cerrada y que no puede esperar hasta el siguiente da hbil, puede llamar/localizar a su doctor(a) al nmero que aparece a continuacin.   Por favor, tenga en cuenta que aunque hacemos todo lo posible para estar disponibles para asuntos urgentes fuera del horario de [place], no estamos disponibles las 24 horas del da, los 7 das de la [place].   Si tiene un problema urgente y no puede comunicarse con nosotros, puede optar por buscar atencin mdica  en el consultorio de su doctor(a), en una clnica privada, en un centro de atencin urgente o en una sala de emergencias.  Si tiene Engineering geologist, por favor llame inmediatamente al 911 o vaya a la sala de emergencias.  Nmeros de bper  - Dr. Nehemiah Massed: [phone]  - Dra. Moye: [phone]  - Dra. Nicole Kindred: [phone]  En caso de inclemencias del [place], por favor llame a Johnsie Kindred principal al [phone] para una actualizacin sobre el [place] de cualquier retraso o cierre.  Consejos para la medicacin en dermatologa: Por favor, guarde las cajas en las que vienen los medicamentos de uso tpico para ayudarle a seguir las instrucciones sobre dnde y cmo usarlos. Las farmacias generalmente imprimen las instrucciones del medicamento slo en las cajas y no directamente en los tubos del [place].   Si su medicamento es muy caro, por favor, pngase en contacto con Zigmund Daniel llamando al [phone] y presione la opcin 4 o envenos un mensaje a travs de Pharmacist, community.   No podemos decirle cul ser su copago por los medicamentos  por adelantado ya que esto es diferente dependiendo de la cobertura de su seguro. Sin embargo, es posible que podamos encontrar un medicamento sustituto a Electrical engineer un formulario para que el seguro cubra el medicamento que se considera necesario.   Si se requiere una autorizacin previa para que su compaa de seguros [place] su medicamento, por favor permtanos de 1 a 2 das hbiles para completar este proceso.  Los precios de los medicamentos varan con frecuencia dependiendo del Environmental consultant de dnde se surte la receta y alguna farmacias pueden ofrecer precios ms baratos.  El sitio web www.goodrx.com tiene cupones para medicamentos de Airline pilot. Los precios aqu no tienen en cuenta lo que podra costar con la ayuda del seguro (puede ser ms barato con su seguro), pero el sitio web puede darle el precio si no utiliz Research scientist (physical sciences).  - Puede imprimir el cupn correspondiente y llevarlo con su receta a la farmacia.  - Tambin puede pasar por nuestra oficina durante el horario  de atencin regular y Charity fundraiser una tarjeta de cupones de GoodRx.  - Si necesita que su receta se enve electrnicamente a una farmacia diferente, informe a nuestra oficina a travs de MyChart de  o por telfono llamando al [phone] y presione la opcin 4.

## 2022-01-08 ENCOUNTER — Ambulatory Visit (INDEPENDENT_AMBULATORY_CARE_PROVIDER_SITE_OTHER): Payer: Managed Care, Other (non HMO) | Admitting: Internal Medicine

## 2022-01-08 ENCOUNTER — Encounter: Payer: Self-pay | Admitting: Internal Medicine

## 2022-01-08 ENCOUNTER — Encounter: Payer: Self-pay | Admitting: Dermatology

## 2022-01-08 VITALS — BP 130/86 | HR 64 | Temp 97.6°F | Ht 63.5 in | Wt 263.0 lb

## 2022-01-08 DIAGNOSIS — Z23 Encounter for immunization: Secondary | ICD-10-CM

## 2022-01-08 DIAGNOSIS — Z0000 Encounter for general adult medical examination without abnormal findings: Secondary | ICD-10-CM

## 2022-01-08 DIAGNOSIS — I10 Essential (primary) hypertension: Secondary | ICD-10-CM | POA: Diagnosis not present

## 2022-01-08 DIAGNOSIS — E039 Hypothyroidism, unspecified: Secondary | ICD-10-CM

## 2022-01-08 DIAGNOSIS — I872 Venous insufficiency (chronic) (peripheral): Secondary | ICD-10-CM

## 2022-01-08 DIAGNOSIS — R7303 Prediabetes: Secondary | ICD-10-CM | POA: Diagnosis not present

## 2022-01-08 NOTE — Assessment & Plan Note (Signed)
Off meds Will check labs

## 2022-01-08 NOTE — Progress Notes (Signed)
Subjective:    Patient ID: Sabrina [name], female    DOB: 05-Jun-1955, 66 y.o.   MRN: [id number]  HPI Here for physical  Doing well overall Some trouble with left knee Had injections--but still painful Is trying to walk on break from work--has been limited at times Using arthritis tylenol--slight help Has tried OTC gel----diclofenac? ---has helped some  Had lost some weight --but gained back more Eats out a fair bit--discussed  Doesn't check sugars Couldn't tolerate the metformin---diarrhea/nausea  Uses the furosemide 3 days a week--when she is home  Planning to retire within the year  Current Outpatient Medications on File Prior to Visit  Medication Sig Dispense Refill   ciclopirox (LOPROX) 0.77 % cream Apply twice daily until 1 week post clearance, then once a week 90 g 3   clotrimazole-betamethasone (LOTRISONE) cream Apply 1 application topically 2 (two) times daily. 30 g 0   Crisaborole (EUCRISA) 2 % OINT Apply 1 Application topically as directed. Qd to bid to aa eczema on body prn flares 100 g 11   furosemide (LASIX) 80 MG tablet TAKE 1 TABLET BY MOUTH DAILY AS NEEDED 30 tablet 11   naproxen sodium (ANAPROX) 220 MG tablet Take 1 tablet by mouth 2 (two) times daily as needed.     nystatin (NYSTATIN) powder Apply 1 application topically 3 (three) times daily as needed. 60 g 3   omeprazole (PRILOSEC) 20 MG capsule Take 1 capsule (20 mg total) by mouth daily. 90 capsule 3   triamterene-hydrochlorothiazide (MAXZIDE-25) 37.5-25 MG tablet TAKE 1 TABLET BY MOUTH DAILY 90 tablet 3   valACYclovir (VALTREX) 1000 MG tablet Take 1 tablet (1,000 mg total) by mouth as directed. Take 2 po at onset of symptoms then take 2 po 12 hours later 30 tablet 11   No current facility-administered medications on file prior to visit.    No Known Allergies  Past Medical History:  Diagnosis Date   Allergic rhinitis    Arthritis    Right knee, right elbow   BRCA negative 03/2020   MyRisk neg  except MSH3 VUS   Complex tear of medial meniscus of right knee 06/26/2016   Family history of breast cancer    GERD (gastroesophageal reflux disease)    Hypertension    Hypothyroidism 2008   Increased risk of breast cancer 03/2020   IBIS=21.9%/riskscore=19.2%   Obstructive sleep apnea    CPAP   --   NOT ABLE TO USE    PONV (postoperative nausea and vomiting)    after knee surgery   Viral meningitis 5/08   [facility]   Wears dentures    partial upper and lower    Past Surgical History:  Procedure Laterality Date   ABDOMINAL HYSTERECTOMY  1982   Dr Vernie Ammons assisted by Dr Bary Castilla   APPENDECTOMY     teenager   BREAST BIOPSY Left 03/09/2015   benign/ Dr. Bary Castilla done in office NEG   BREAST BIOPSY Left 06/22/2020   [place] bx/ vision clip/ benign   BREAST BIOPSY Right 06/29/2021   affirm bx, coil mrker, path pending   BREAST EXCISIONAL BIOPSY Left 1999   [facility] NEG   CHONDROPLASTY Right 06/26/2016   Procedure: CHONDROPLASTY;  Surgeon: Marchia Bond, MD;  Location: [facility];  Service: Orthopedics;  Laterality: Right;   KNEE ARTHROSCOPY W/ LASER  11/2005   Left   KNEE ARTHROSCOPY WITH MEDIAL MENISECTOMY Right 06/26/2016   Procedure: RIGHT KNEE ARTHROSCOPY WITH MEDIAL MENISCECTOMY AND CHONDROPLASTY;  Surgeon:  Marchia Bond, MD;  Location: [facility];  Service: Orthopedics;  Laterality: Right;   MASS EXCISION N/A 02/19/2018   Procedure: EXCISION UPPER LIP MUCOCELE;  Surgeon: Carloyn Manner, MD;  Location: [facility];  Service: ENT;  Laterality: N/A;  sleep apnea   TONSILLECTOMY     as child   VESICOVAGINAL FISTULA CLOSURE W/ TAH  1987   Fibroids    Family History  Problem Relation Age of Onset   Breast cancer Mother 75       BRCA neg   Hypertension Father    Leukemia Brother    Prostate cancer Maternal Grandfather    Breast cancer Maternal Aunt 69   Breast cancer Maternal Aunt 60   Breast cancer Maternal Aunt    Cancer Paternal  Uncle        pancreatic cancer   Hypertension Other        both sides   Cancer Other        maternal side   Coronary artery disease Neg Hx    Diabetes Neg Hx    Colon cancer Neg Hx    Liver cancer Neg Hx     Social History   Socioeconomic History   Marital status: Married    Spouse name: Not on file   Number of children: 0   Years of education: Not on file   Highest education level: Not on file  Occupational History   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     Employer: [employer]   Occupation:      Comment:    Tobacco Use   Smoking status: Never    Passive exposure: Past   Smokeless tobacco: Never  Vaping Use   Vaping Use: Never used  Substance and Sexual Activity   Alcohol use: Yes    Comment: beer on weekends- occasional   Drug use: No   Sexual activity: Yes    Birth control/protection: None  Other Topics Concern   Not on file  Social History Narrative   From [place].   Moved to [place] in high school then back here.     Social Determinants of Health   Financial Resource Strain: Not on file  Food Insecurity: Not on file  Transportation Needs: Not on file  Physical Activity: Not on file  Stress: Not on file  Social Connections: Not on file  Intimate Partner Violence: Not on file   Review of Systems  Constitutional:  Negative for fatigue.       Wears seat belt  HENT:  Negative for dental problem, hearing loss, tinnitus and trouble swallowing.        Keeps up with dentist  Eyes:  Negative for visual disturbance.       No diplopia or unilateral vision loss  Respiratory:  Negative for cough, chest tightness and shortness of breath.   Cardiovascular:  Negative for chest pain and palpitations.  Gastrointestinal:  Negative for blood in stool and constipation.       Rare heartburn on omeprazole sporadically  Endocrine: Negative for polydipsia and polyuria.  Genitourinary:  Negative for dyspareunia, dysuria and hematuria.  Musculoskeletal:  Positive for  arthralgias. Negative for back pain and joint swelling.  Skin:  Negative for rash.       No suspicious lesions  Allergic/Immunologic: Negative for environmental allergies and immunocompromised state.  Neurological:  Negative for dizziness, syncope, light-headedness and headaches.  Hematological:  Negative for adenopathy. Bruises/bleeds easily.  Psychiatric/Behavioral:  Negative for dysphoric  mood and sleep disturbance. The patient is not nervous/anxious.        Objective:   Physical Exam Constitutional:      Appearance: Normal appearance.  HENT:     Mouth/Throat:     Pharynx: No oropharyngeal exudate or posterior oropharyngeal erythema.  Eyes:     Conjunctiva/sclera: Conjunctivae normal.     Pupils: Pupils are equal, round, and reactive to light.  Cardiovascular:     Rate and Rhythm: Normal rate and regular rhythm.     Pulses: Normal pulses.     Heart sounds: No murmur heard.    No gallop.  Pulmonary:     Effort: Pulmonary effort is normal.     Breath sounds: Normal breath sounds. No wheezing or rales.  Abdominal:     Palpations: Abdomen is soft.     Tenderness: There is no abdominal tenderness.  Musculoskeletal:     Cervical back: Neck supple.     Comments: 2-3+ tense calf swelling--no pitting  Lymphadenopathy:     Cervical: No cervical adenopathy.  Skin:    Findings: No lesion or rash.  Neurological:     General: No focal deficit present.     Mental Status: She is alert and oriented to person, place, and time.  Psychiatric:        Mood and Affect: Mood normal.        Behavior: Behavior normal.            Assessment & Plan:

## 2022-01-08 NOTE — Assessment & Plan Note (Signed)
Healthy Discussed better eating--_DASH info Needs to exercise Mammogram due again in March COlon next year No pap due to hyster

## 2022-01-08 NOTE — Assessment & Plan Note (Signed)
Uses the furosemide when she will be home

## 2022-01-08 NOTE — Addendum Note (Signed)
Addended by: Pilar Grammes on: 01/08/2022 04:36 PM   Modules accepted: Orders

## 2022-01-08 NOTE — Patient Instructions (Signed)

## 2022-01-08 NOTE — Assessment & Plan Note (Signed)
BP Readings from Last 3 Encounters:  01/08/22 130/86  10/31/21 122/70  09/11/21 138/80   Good control on triamterene/HCTZ

## 2022-01-08 NOTE — Assessment & Plan Note (Signed)
Didn't tolerate the metformin Won't do shots If diabetic, will consider semaglutide oral Consider glipizide

## 2022-01-09 LAB — COMPREHENSIVE METABOLIC PANEL
ALT: 20 IU/L (ref 0–32)
AST: 18 IU/L (ref 0–40)
Albumin/Globulin Ratio: 1.7 (ref 1.2–2.2)
Albumin: 4.3 g/dL (ref 3.9–4.9)
Alkaline Phosphatase: 71 IU/L (ref 44–121)
BUN/Creatinine Ratio: 12 (ref 12–28)
BUN: 10 mg/dL (ref 8–27)
Bilirubin Total: 0.6 mg/dL (ref 0.0–1.2)
CO2: 26 mmol/L (ref 20–29)
Calcium: 9.2 mg/dL (ref 8.7–10.3)
Chloride: 105 mmol/L (ref 96–106)
Creatinine, Ser: 0.83 mg/dL (ref 0.57–1.00)
Globulin, Total: 2.6 g/dL (ref 1.5–4.5)
Glucose: 93 mg/dL (ref 70–99)
Potassium: 4.2 mmol/L (ref 3.5–5.2)
Sodium: 142 mmol/L (ref 134–144)
Total Protein: 6.9 g/dL (ref 6.0–8.5)
eGFR: 79 mL/min/{1.73_m2} (ref >59)

## 2022-01-09 LAB — LIPID PANEL
Chol/HDL Ratio: 3.5 ratio (ref 0.0–4.4)
Cholesterol, Total: 191 mg/dL (ref 100–199)
HDL: 54 mg/dL (ref >39)
LDL Chol Calc (NIH): 123 mg/dL — ABNORMAL HIGH (ref 0–99)
Triglycerides: 74 mg/dL (ref 0–149)
VLDL Cholesterol Cal: 14 mg/dL (ref 5–40)

## 2022-01-09 LAB — HEMOGLOBIN A1C
Est. average glucose Bld gHb Est-mCnc: 126 mg/dL
Hgb A1c MFr Bld: 6 % — ABNORMAL HIGH (ref 4.8–5.6)

## 2022-01-09 LAB — T4, FREE: Free T4: 1.03 ng/dL (ref 0.82–1.77)

## 2022-01-09 LAB — CBC
Hematocrit: 37.6 % (ref 34.0–46.6)
Hemoglobin: 12.8 g/dL (ref 11.1–15.9)
MCH: 30.9 pg (ref 26.6–33.0)
MCHC: 34 g/dL (ref 31.5–35.7)
MCV: 91 fL (ref 79–97)
Platelets: 255 10*3/uL (ref 150–450)
RBC: 4.14 x10E6/uL (ref 3.77–5.28)
RDW: 12.6 % (ref 11.7–15.4)
WBC: 9.2 10*3/uL (ref 3.4–10.8)

## 2022-01-09 LAB — TSH: TSH: 3.18 u[IU]/mL (ref 0.450–4.500)

## 2022-02-02 ENCOUNTER — Other Ambulatory Visit: Payer: Self-pay

## 2022-02-02 MED ORDER — COMIRNATY 30 MCG/0.3ML IM SUSP
INTRAMUSCULAR | 0 refills | Status: DC
  Filled 2022-02-02 – 2022-02-05 (×2): qty 0.3, 1d supply, fill #0

## 2022-02-05 ENCOUNTER — Other Ambulatory Visit: Payer: Self-pay

## 2022-04-09 ENCOUNTER — Ambulatory Visit: Payer: Managed Care, Other (non HMO) | Admitting: Dermatology

## 2022-06-26 IMAGING — DX DG CHEST 1V
1 series · 1 of 1 positions shown · non-contrast
Comparison: No priors.

CLINICAL DATA: 64-year-old female with history of dizziness and
vomiting.

EXAM:
CHEST  1 VIEW

[chest ap]
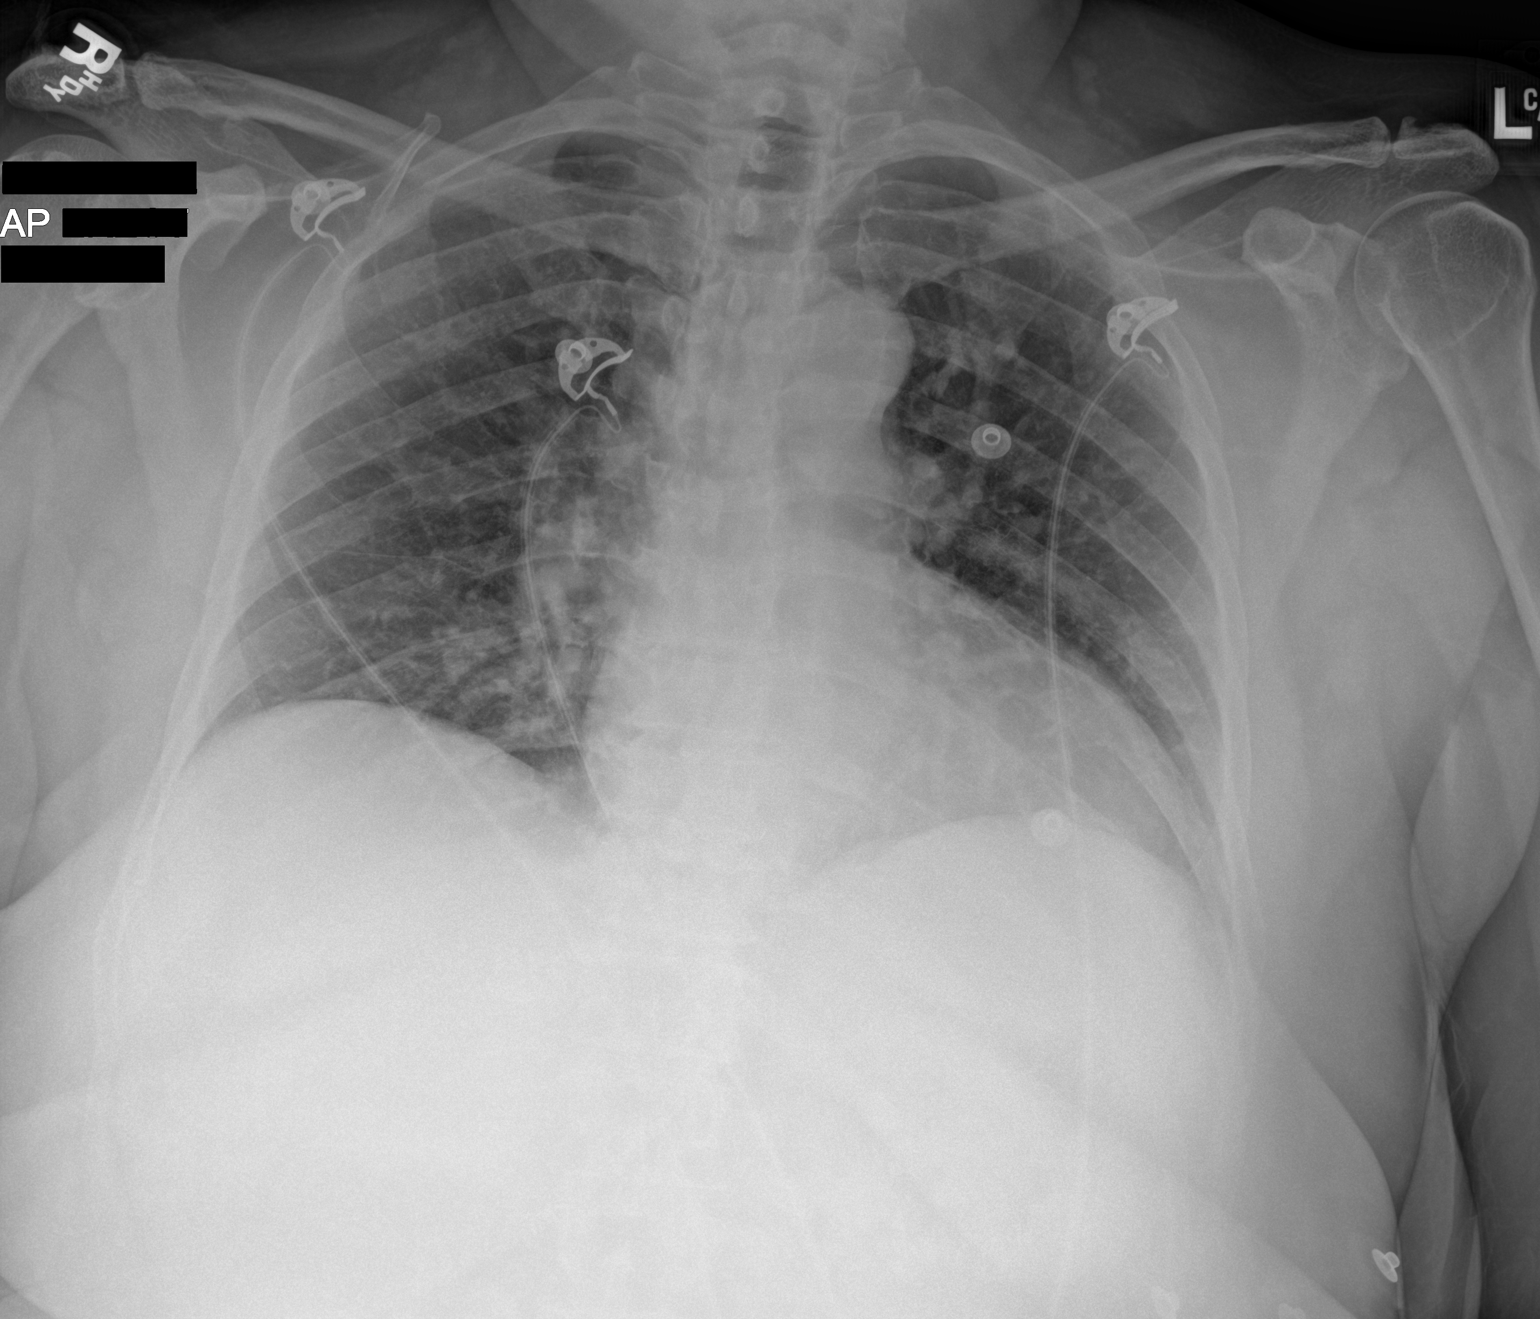

[1 of 1 positions shown; findings below may reference images not displayed]

FINDINGS: Lung volumes are low. No consolidative airspace disease. No pleural
effusions. No pneumothorax. Cephalization of the pulmonary
vasculature, without frank pulmonary edema. Heart size is borderline
enlarged. Upper mediastinal contours are within normal limits.
IMPRESSION: 1. Borderline cardiomegaly with cephalization of the pulmonary
vasculature, but no frank pulmonary edema.

## 2022-06-26 IMAGING — CT CT HEAD CODE STROKE
3 series · 15 of 47 positions shown, 18 images · non-contrast
Comparison: Noncontrast head CT 10/10/2009.

CLINICAL DATA: Code stroke. Neuro deficit, acute, stroke suspected.
Additional provided: Headache, feeling "woozy" in head, nausea and
vomiting.

EXAM:
CT HEAD WITHOUT CONTRAST
TECHNIQUE: Contiguous axial images were obtained from the base of the skull
through the vertex without intravenous contrast.

[Series 2: head wo · axial · 0.41mm/px · z∈[-167,-42]mm · 9 of 31 slices shown, 12 images]
[im 3/31  brain]
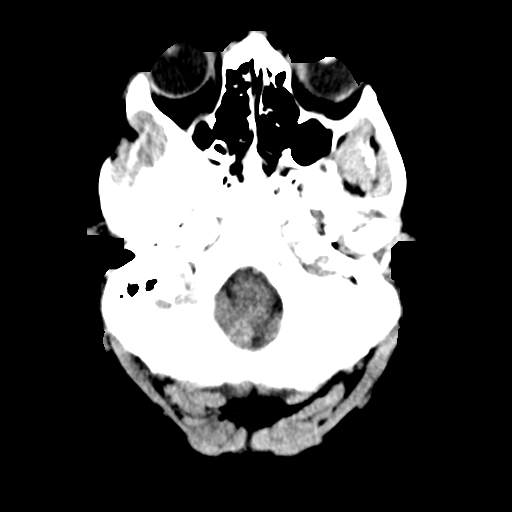
[im 3/31  bone]
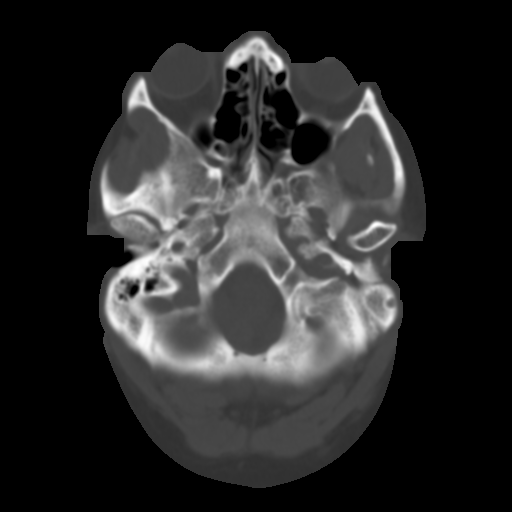
[im 6/31  brain]
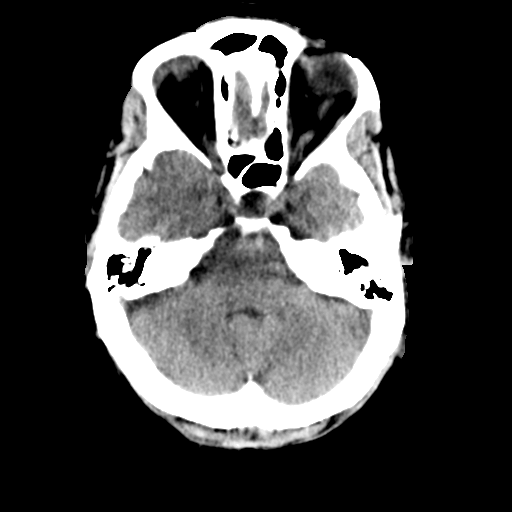
[im 9/31  brain]
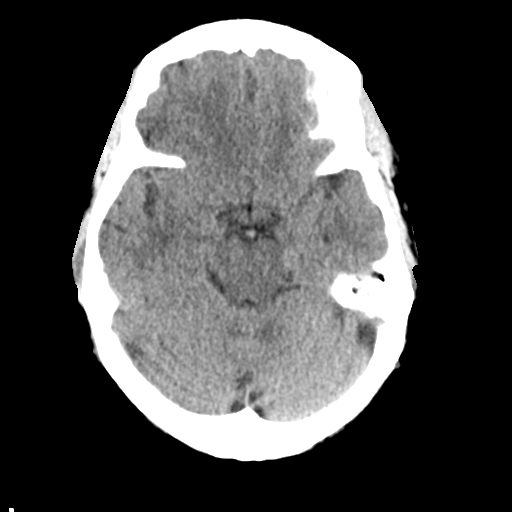
[im 12/31  brain]
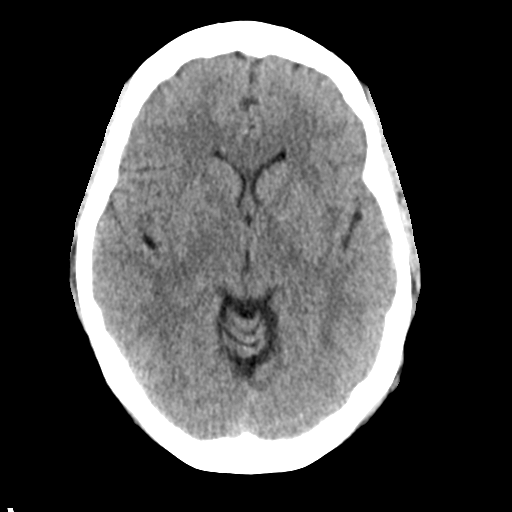
[im 16/31  brain]
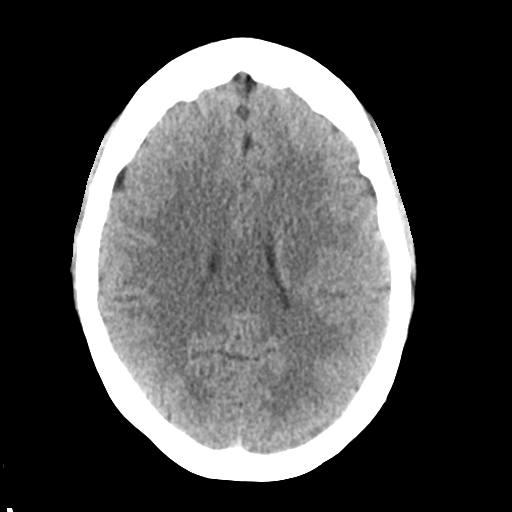
[im 16/31  bone]
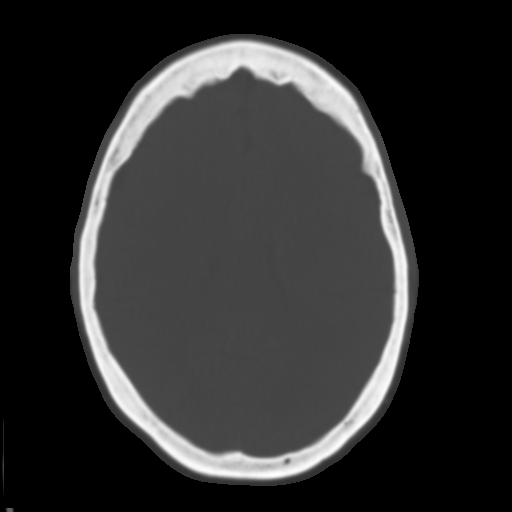
[im 19/31  brain]
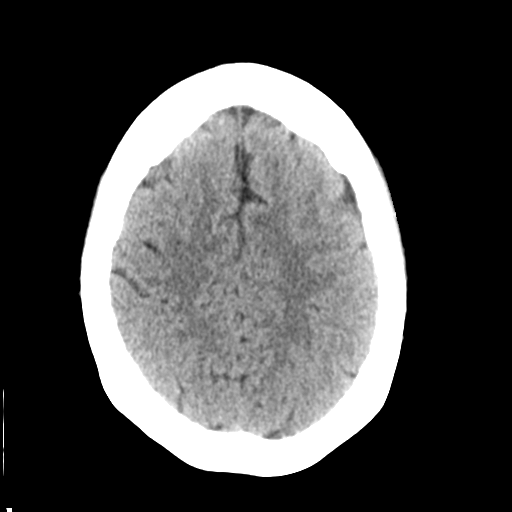
[im 22/31  brain]
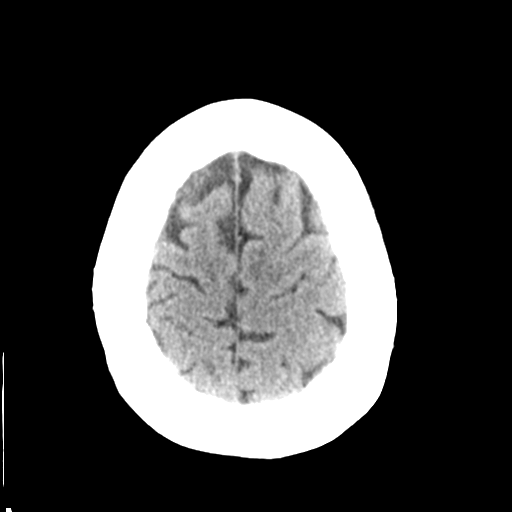
[im 25/31  brain]
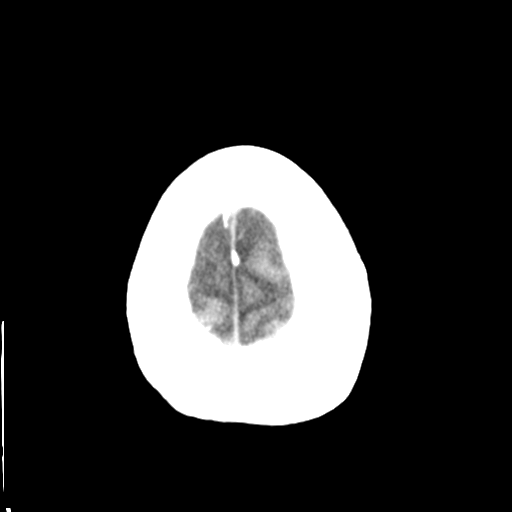
[im 28/31  brain]
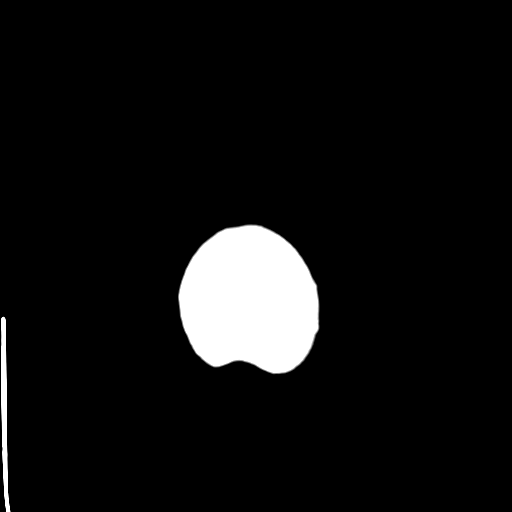
[im 28/31  bone]
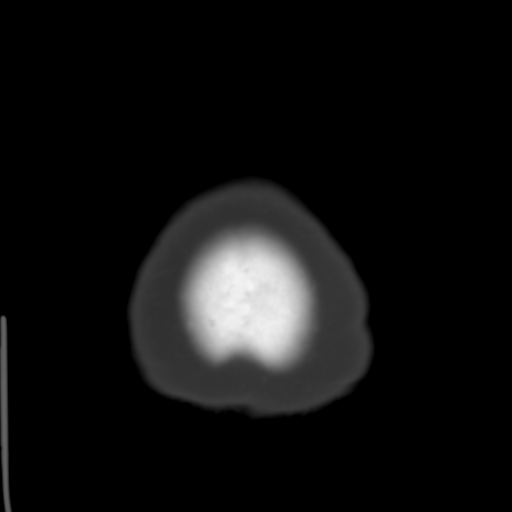

[Series 4: coronal soft tissue · coronal · 0.29mm/px · 3 of 65 slices shown]
[im 22/65  brain]
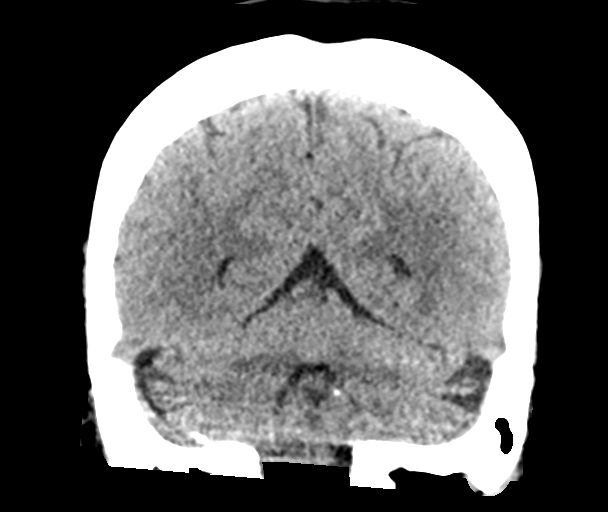
[im 29/65  brain]
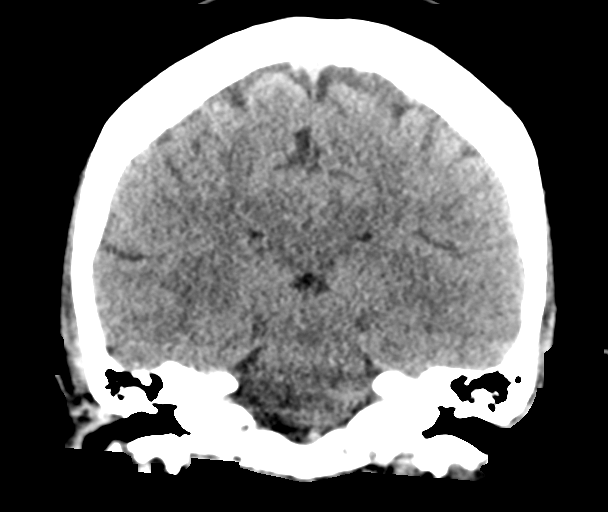
[im 36/65  brain]
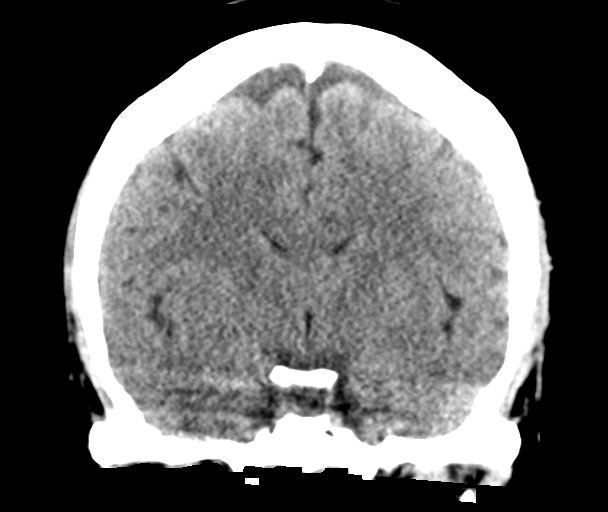

[Series 5: sagittal soft tissue · sagittal · 0.31mm/px · 3 of 53 slices shown]
[im 18/53  brain]
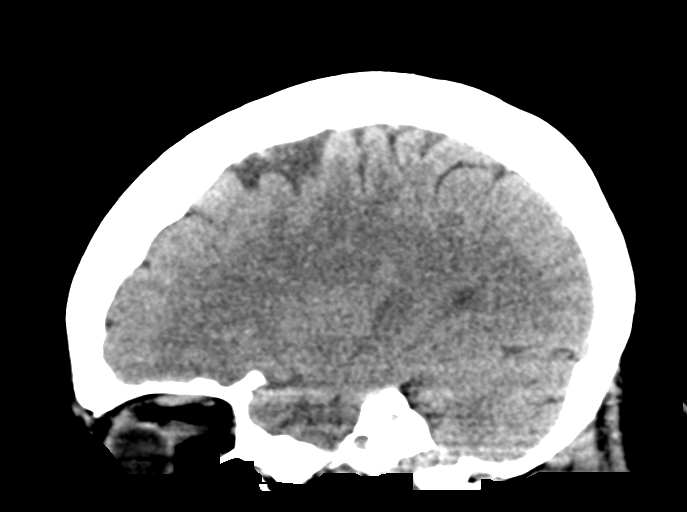
[im 27/53  brain]
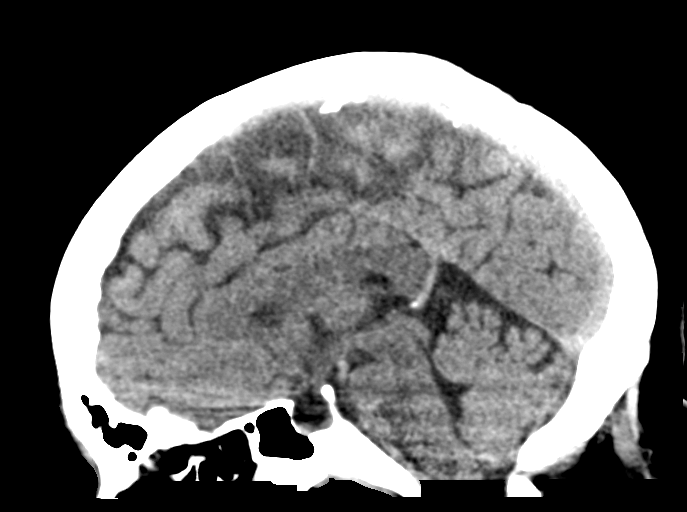
[im 35/53  brain]
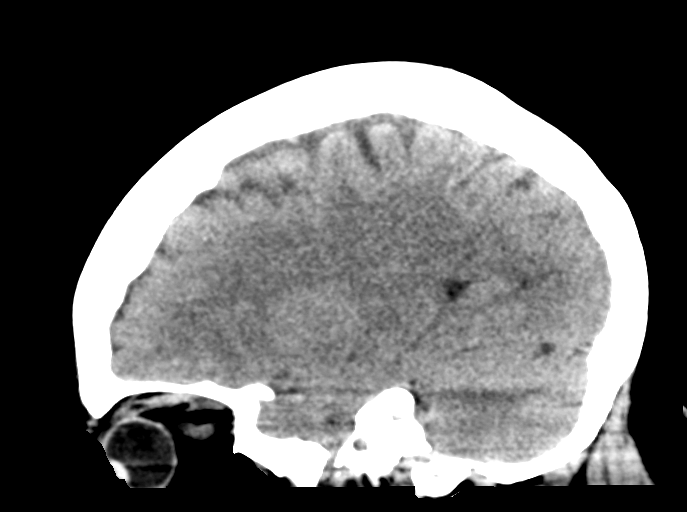

[15 of 47 positions shown; findings below may reference images not displayed]

FINDINGS: Brain:

Cerebral volume is normal.

There is no acute intracranial hemorrhage.

No demarcated cortical infarct.

No extra-axial fluid collection.

No evidence of intracranial mass.

No midline shift.

Partially empty sella turcica.

Vascular: No definite hyperdense vessel.

Skull: Normal. Negative for fracture or focal lesion.

Sinuses/Orbits: Visualized orbits show no acute finding. No
significant paranasal sinus disease at the imaged levels.

ASPECTS (Alberta Stroke Program Early CT Score)

- Ganglionic level infarction (caudate, lentiform nuclei, internal
capsule, insula, M1-M3 cortex): 7

- Supraganglionic infarction (M4-M6 cortex): 3

Total score (0-10 with 10 being normal): 10

These results were called by telephone at the time of interpretation
on 02/27/2020 at [DATE] to provider DALOULET OMHA SWID , who verbally
acknowledged these results.
IMPRESSION: No evidence of acute intracranial abnormality.

Partially empty sella turcica. This finding is very commonly
incidental, but can be associated with idiopathic intracranial
hypertension.

Otherwise unremarkable noncontrast CT appearance of the brain.

## 2022-07-03 ENCOUNTER — Other Ambulatory Visit: Payer: Self-pay | Admitting: Internal Medicine

## 2022-07-03 DIAGNOSIS — Z1231 Encounter for screening mammogram for malignant neoplasm of breast: Secondary | ICD-10-CM

## 2022-07-05 ENCOUNTER — Emergency Department
Admission: EM | Admit: 2022-07-05 | Discharge: 2022-07-05 | Disposition: A | Discharge status: Home / Self Care | Payer: Managed Care, Other (non HMO) | Attending: Emergency Medicine | Admitting: Emergency Medicine

## 2022-07-05 ENCOUNTER — Other Ambulatory Visit: Payer: Self-pay

## 2022-07-05 ENCOUNTER — Encounter: Payer: Self-pay | Admitting: Emergency Medicine

## 2022-07-05 DIAGNOSIS — I10 Essential (primary) hypertension: Secondary | ICD-10-CM | POA: Diagnosis not present

## 2022-07-05 DIAGNOSIS — E039 Hypothyroidism, unspecified: Secondary | ICD-10-CM | POA: Diagnosis not present

## 2022-07-05 DIAGNOSIS — R42 Dizziness and giddiness: Secondary | ICD-10-CM | POA: Insufficient documentation

## 2022-07-05 DIAGNOSIS — Z8616 Personal history of COVID-19: Secondary | ICD-10-CM | POA: Diagnosis not present

## 2022-07-05 LAB — CBC WITH DIFFERENTIAL/PLATELET
Abs Immature Granulocytes: 0.05 10*3/uL (ref 0.00–0.07)
Basophils Absolute: 0.1 10*3/uL (ref 0.0–0.1)
Basophils Relative: 1 %
Eosinophils Absolute: 0.1 10*3/uL (ref 0.0–0.5)
Eosinophils Relative: 1 %
HCT: 39.2 % (ref 36.0–46.0)
Hemoglobin: 12.4 g/dL (ref 12.0–15.0)
Immature Granulocytes: 0 %
Lymphocytes Relative: 30 %
Lymphs Abs: 3.4 10*3/uL (ref 0.7–4.0)
MCH: 30.3 pg (ref 26.0–34.0)
MCHC: 31.6 g/dL (ref 30.0–36.0)
MCV: 95.8 fL (ref 80.0–100.0)
Monocytes Absolute: 0.6 10*3/uL (ref 0.1–1.0)
Monocytes Relative: 5 %
Neutro Abs: 7 10*3/uL (ref 1.7–7.7)
Neutrophils Relative %: 63 %
Platelets: 273 10*3/uL (ref 150–400)
RBC: 4.09 MIL/uL (ref 3.87–5.11)
RDW: 13 % (ref 11.5–15.5)
WBC: 11.3 10*3/uL — ABNORMAL HIGH (ref 4.0–10.5)
nRBC: 0 % (ref 0.0–0.2)

## 2022-07-05 LAB — COMPREHENSIVE METABOLIC PANEL
ALT: 14 U/L (ref 0–44)
AST: 15 U/L (ref 15–41)
Albumin: 3.7 g/dL (ref 3.5–5.0)
Alkaline Phosphatase: 60 U/L (ref 38–126)
Anion gap: 5 (ref 5–15)
BUN: 17 mg/dL (ref 8–23)
CO2: 24 mmol/L (ref 22–32)
Calcium: 8.6 mg/dL — ABNORMAL LOW (ref 8.9–10.3)
Chloride: 108 mmol/L (ref 98–111)
Creatinine, Ser: 0.76 mg/dL (ref 0.44–1.00)
GFR, Estimated: >60 mL/min (ref >60)
Glucose, Bld: 138 mg/dL — ABNORMAL HIGH (ref 70–99)
Potassium: 3.6 mmol/L (ref 3.5–5.1)
Sodium: 137 mmol/L (ref 135–145)
Total Bilirubin: 0.7 mg/dL (ref 0.3–1.2)
Total Protein: 6.9 g/dL (ref 6.5–8.1)

## 2022-07-05 NOTE — ED Triage Notes (Signed)
Pt presents ambulatory to triage via POV with complaints of dizziness that started tonight while at work. Pt endorses N/V associated with her dizziness. A&Ox4 at this time. Denies CP or SOB.

## 2022-07-05 NOTE — ED Notes (Signed)
Patient discharged at this time. Wheeled to lobby per request. Breathing unlabored speaking in full sentences. Verbalized understanding of all discharge, follow up, and medication teaching. Discharged homed with all belongings.   

## 2022-07-05 NOTE — ED Notes (Signed)
ED Provider at bedside. 

## 2022-07-05 NOTE — ED Triage Notes (Signed)
EMS brings pt in from her job for onset dizziness while working accomp by N/V; st recent stressors

## 2022-07-05 NOTE — ED Provider Notes (Signed)
[facility] Provider Note    Event Date/Time   First MD Initiated Contact with Patient 07/05/22 0404     (approximate)   History   Dizziness   HPI  Sabrina [name] is a 67 y.o. female past medical history of OSA, hypertension, hypothyroidism chronic lower extremity swelling who presents after an episode of dizziness.  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  She felt she had to keep her head down because if she lifted her head or sat back this felt worse.  Denies vision change diplopia numbness tingling weakness no chest pain or dyspnea.  Lasted for about 20 minutes and has since resolved.  She also felt nauseous during the episode.  She feels somewhat weak and tired with mild frontal headache but feels much better than she did before no ongoing dizziness     Past Medical History:  Diagnosis Date   Allergic rhinitis    Arthritis    Right knee, right elbow   BRCA negative 03/2020   MyRisk neg except MSH3 VUS   Complex tear of medial meniscus of right knee 06/26/2016   Family history of breast cancer    GERD (gastroesophageal reflux disease)    Hypertension    Hypothyroidism 2008   Increased risk of breast cancer 03/2020   IBIS=21.9%/riskscore=19.2%   Obstructive sleep apnea    CPAP   --   NOT ABLE TO USE    PONV (postoperative nausea and vomiting)    after knee surgery   Viral meningitis 5/08   [facility]   Wears dentures    partial upper and lower    Patient Active Problem List   Diagnosis Date Noted   Neck pain 10/31/2021   COVID-19 virus infection 09/06/2021   Essential hypertension, benign 10/20/2019   Prediabetes 10/20/2019   History of vaginal dysplasia 11/29/2016   Osteoarthritis of knee 09/23/2013   Routine general medical examination at a health care facility 02/27/2011   Chronic venous insufficiency 11/14/2009   ALLERGIC RHINITIS 09/30/2009    OBSTRUCTIVE SLEEP APNEA 10/13/2008   Hypothyroidism 11/14/2006   GERD 11/14/2006     Physical Exam  Triage Vital Signs: ED Triage Vitals  Enc Vitals Group     BP 07/05/22 0043 (!) 147/80     Pulse Rate 07/05/22 0043 70     Resp 07/05/22 0043 18     Temp 07/05/22 0043 97.7 F (36.5 C)     Temp Source 07/05/22 0043 Oral     SpO2 07/05/22 0021 100 %     Weight 07/05/22 0042 252 lb (114.3 kg)     Height 07/05/22 0042 '5\' 4"'$  (1.626 m)     Head Circumference --      Peak Flow --      Pain Score 07/05/22 0041 0     Pain Loc --      Pain Edu? --      Excl. in [place]? --     Most recent vital signs: Vitals:   07/05/22 0043 07/05/22 0430  BP: (!) 147/80 (!) 141/69  Pulse: 70 67  Resp: 18 14  Temp: 97.7 F (36.5 C)   SpO2: 100% 99%     General: Awake, no distress.  CV:  Good peripheral perfusion.  Resp:  Normal effort.  Abd:  No distention.  Neuro:             Awake, Alert, Oriented  x 3  Other:  Aox3, nml speech  PERRL, EOMI, face symmetric, nml tongue movement  Left beating horizontal nystagmus, no vertical nystagmus 5/5 strength in the BL upper and lower extremities  Sensation grossly intact in the BL upper and lower extremities  Finger-nose-finger intact BL Normal gait no ataxia   ED Results / Procedures / Treatments  Labs (all labs ordered are listed, but only abnormal results are displayed) Labs Reviewed  CBC WITH DIFFERENTIAL/PLATELET - Abnormal; Notable for the following components:      Result Value   WBC 11.3 (*)    All other components within normal limits  COMPREHENSIVE METABOLIC PANEL - Abnormal; Notable for the following components:   Glucose, Bld 138 (*)    Calcium 8.6 (*)    All other components within normal limits     EKG  EKG interpretation performed by myself: NSR, nml axis, nml intervals, no acute ischemic changes    RADIOLOGY    PROCEDURES:  Critical Care performed: No  .1-3 Lead EKG Interpretation  Performed by: Rada Hay, MD Authorized by: Rada Hay, MD     Interpretation: normal     ECG rate assessment: normal     Rhythm: sinus rhythm     Ectopy: none     Conduction: normal     The patient is on the cardiac monitor to evaluate for evidence of arrhythmia and/or significant heart rate changes.   MEDICATIONS ORDERED IN ED: Medications - No data to display   IMPRESSION / MDM / Merna / ED COURSE  I reviewed the triage vital signs and the nursing notes.                              Patient's presentation is most consistent with acute complicated illness / injury requiring diagnostic workup.  Differential diagnosis includes, but is not limited to, peripheral vertigo including BPPV, vestibular neuritis, less likely posterior TIA, posterior stroke, vasovagal episode  Patient is a 67 year old female with self resolving episode of dizziness.  Was acute in onset vertiginous in quality and worse with head movement and is now resolved.  Does continue to feel somewhat tired and weak but her family member notes she has been under quite a bit of stress and not sleeping much.  Vital signs overall reassuring.  Exam also reassuring she has some left beating horizontal nystagmus but otherwise neurologic exam is normal no vertical nystagmus no ataxia normal finger-nose-finger and normal gait.  Suspect BPPV.  Given she continues to feel somewhat weak will check CBC and CMP.  No indication for meclizine or other treatment at this time given dizziness has resolved.  CBC and CMP are within normal limits.  Patient continues to feel well with no recurrent dizziness.  Will discharge.       FINAL CLINICAL IMPRESSION(S) / ED DIAGNOSES   Final diagnoses:  Vertigo     Rx / DC Orders   ED Discharge Orders     None        Note:  This document was prepared using Dragon voice recognition software and may include unintentional dictation errors.   Rada Hay, MD 07/05/22 [phone]

## 2022-07-05 NOTE — ED Notes (Signed)
RN called lab to add on ordered blood work. Lab staff states they will be put into process at this time.

## 2022-07-09 ENCOUNTER — Telehealth: Payer: Self-pay

## 2022-07-09 NOTE — Transitions of Care (Post Inpatient/ED Visit) (Signed)
   07/09/2022  Name: Sabrina [name] MRN: [id number] DOB: [birth year]  Today's TOC FU Call Status: TOC FU Call Complete Date: 07/09/22  Transition Care Management Follow-up Telephone Call Date of Discharge: 07/05/22 Discharge Facility: [facility]) Type of Discharge: Emergency Department Reason for ED Visit: Other: (Vertigo) How have you been since you were released from the hospital?: Better Any questions or concerns?: No  Items Reviewed: Did you receive and understand the discharge instructions provided?: Yes Medications obtained and verified?: Yes (Medications Reviewed) Any new allergies since your discharge?: No Dietary orders reviewed?: Yes Do you have support at home?: Yes  Home Care and Equipment/Supplies: [facility] Ordered?: NA Any new equipment or medical supplies ordered?: NA  Functional Questionnaire: Do you need assistance with bathing/showering or dressing?: No Do you need assistance with meal preparation?: No Do you need assistance with eating?: No Do you have difficulty maintaining continence: No Do you need assistance with getting out of bed/getting out of a chair/moving?: No Do you have difficulty managing or taking your medications?: No  Follow up appointments reviewed: PCP Follow-up appointment confirmed?: Yes Date of PCP follow-up appointment?: 07/18/22 Follow-up Provider: Dr Silvio Pate [facility] Follow-up appointment confirmed?: NA Do you need transportation to your follow-up appointment?: No Do you understand care options if your condition(s) worsen?: Yes-patient verbalized understanding    [place] LPN [place] [phone]

## 2022-07-18 ENCOUNTER — Ambulatory Visit: Payer: Managed Care, Other (non HMO) | Admitting: Internal Medicine

## 2022-07-18 ENCOUNTER — Encounter: Payer: Self-pay | Admitting: Internal Medicine

## 2022-07-18 VITALS — BP 138/88 | HR 63 | Temp 97.5°F | Ht 63.5 in | Wt 255.0 lb

## 2022-07-18 DIAGNOSIS — R42 Dizziness and giddiness: Secondary | ICD-10-CM | POA: Diagnosis not present

## 2022-07-18 NOTE — Progress Notes (Signed)
Subjective:    Patient ID: Sabrina [name], female    DOB: 05/12/1955, 67 y.o.   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 Did have nystagmus. Exam benign so no CT scan. Labs fine  Felt drained the next day--stayed out of work No recurrence of the vertigo  No headache No vision change or diplopia  Current Outpatient Medications on File Prior to Visit  Medication Sig Dispense Refill   ciclopirox (LOPROX) 0.77 % cream Apply twice daily until 1 week post clearance, then once a week 90 g 3   clotrimazole-betamethasone (LOTRISONE) cream Apply 1 application topically 2 (two) times daily. 30 g 0   Crisaborole (EUCRISA) 2 % OINT Apply 1 Application topically as directed. Qd to bid to aa eczema on body prn flares 100 g 11   furosemide (LASIX) 80 MG tablet TAKE 1 TABLET BY MOUTH DAILY AS NEEDED 30 tablet 11   naproxen sodium (ANAPROX) 220 MG tablet Take 1 tablet by mouth 2 (two) times daily as needed.     nystatin (NYSTATIN) powder Apply 1 application topically 3 (three) times daily as needed. 60 g 3   omeprazole (PRILOSEC) 20 MG capsule Take 1 capsule (20 mg total) by mouth daily. 90 capsule 3   triamterene-hydrochlorothiazide (MAXZIDE-25) 37.5-25 MG tablet TAKE 1 TABLET BY MOUTH DAILY 90 tablet 3   valACYclovir (VALTREX) 1000 MG tablet Take 1 tablet (1,000 mg total) by mouth as directed. Take 2 po at onset of symptoms then take 2 po 12 hours later 30 tablet 11   No current facility-administered medications on file prior to visit.    No Known Allergies  Past Medical History:  Diagnosis Date   Allergic rhinitis    Arthritis    Right knee, right elbow   BRCA negative 03/2020   MyRisk neg  except MSH3 VUS   Complex tear of medial meniscus of right knee 06/26/2016   Family history of breast cancer    GERD (gastroesophageal reflux disease)    Hypertension    Hypothyroidism 2008   Increased risk of breast cancer 03/2020   IBIS=21.9%/riskscore=19.2%   Obstructive sleep apnea    CPAP   --   NOT ABLE TO USE    PONV (postoperative nausea and vomiting)    after knee surgery   Viral meningitis 5/08   [facility]   Wears dentures    partial upper and lower    Past Surgical History:  Procedure Laterality Date   ABDOMINAL HYSTERECTOMY  1982   Dr Vernie Ammons assisted by Dr Bary Castilla   APPENDECTOMY     teenager   BREAST BIOPSY Left 03/09/2015   benign/ Dr. Bary Castilla done in office NEG   BREAST BIOPSY Left 06/22/2020   [place] bx/ vision clip/ benign   BREAST BIOPSY Right 06/29/2021   affirm bx, coil mrker, path pending   BREAST EXCISIONAL BIOPSY Left 1999   [facility] NEG   CHONDROPLASTY Right 06/26/2016   Procedure: CHONDROPLASTY;  Surgeon: Marchia Bond, MD;  Location: [facility];  Service: Orthopedics;  Laterality: Right;   KNEE ARTHROSCOPY W/ LASER  11/2005   Left   KNEE ARTHROSCOPY WITH MEDIAL MENISECTOMY Right 06/26/2016  Procedure: RIGHT KNEE ARTHROSCOPY WITH MEDIAL MENISCECTOMY AND CHONDROPLASTY;  Surgeon: Marchia Bond, MD;  Location: [facility];  Service: Orthopedics;  Laterality: Right;   MASS EXCISION N/A 02/19/2018   Procedure: EXCISION UPPER LIP MUCOCELE;  Surgeon: Carloyn Manner, MD;  Location: [facility];  Service: ENT;  Laterality: N/A;  sleep apnea   TONSILLECTOMY     as child   VESICOVAGINAL FISTULA CLOSURE W/ TAH  1987   Fibroids    Family History  Problem Relation Age of Onset   Breast cancer Mother 38       BRCA neg   Hypertension Father    Leukemia Brother    Prostate cancer Maternal Grandfather    Breast cancer Maternal Aunt 21   Breast cancer Maternal Aunt 60   Breast cancer Maternal Aunt    Cancer Paternal  Uncle        pancreatic cancer   Hypertension Other        both sides   Cancer Other        maternal side   Coronary artery disease Neg Hx    Diabetes Neg Hx    Colon cancer Neg Hx    Liver cancer Neg Hx     Social History   Socioeconomic History   Marital status: Married    Spouse name: Not on file   Number of children: 0   Years of education: Not on file   Highest education level: Not on file  Occupational History   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     Employer: [employer]   Occupation:      Comment:    Tobacco Use   Smoking status: Never    Passive exposure: Past   Smokeless tobacco: Never  Vaping Use   Vaping Use: Never used  Substance and Sexual Activity   Alcohol use: Yes    Comment: beer on weekends- occasional   Drug use: No   Sexual activity: Yes    Birth control/protection: None  Other Topics Concern   Not on file  Social History Narrative   From [place].   Moved to [place] in high school then back here.     Social Determinants of Health   Financial Resource Strain: Not on file  Food Insecurity: Not on file  Transportation Needs: Not on file  Physical Activity: Not on file  Stress: Not on file  Social Connections: Not on file  Intimate Partner Violence: Not on file   Review of Systems No tinnitus or change in hearing No trouble walking since the ER Stress with husband just needing AAA repair (stent)    Objective:   Physical Exam Constitutional:      Appearance: Normal appearance.  HENT:     Mouth/Throat:     Comments: No nystagmus Neurological:     Mental Status: She is alert and oriented to person, place, and time.     Cranial Nerves: Cranial nerves 2-12 are intact.     Motor: No weakness.     Coordination: Romberg sign negative. Coordination normal. Finger-Nose-Finger Test normal.     Gait: Gait normal.            Assessment & Plan:

## 2022-07-18 NOTE — Assessment & Plan Note (Signed)
Symptoms sounded like near syncope (vagal) but did have true vertigo (and nystagmus documented in the ER) No recurrence Tired now--but with stress (husband's condition) No further action needed Is considering retirement soon--but looking at her options

## 2022-08-06 ENCOUNTER — Ambulatory Visit
Admission: RE | Admit: 2022-08-06 | Discharge: 2022-08-06 | Disposition: A | Discharge status: Home / Self Care | Payer: Managed Care, Other (non HMO) | Source: Ambulatory Visit | Attending: Internal Medicine | Admitting: Internal Medicine

## 2022-08-06 DIAGNOSIS — Z1231 Encounter for screening mammogram for malignant neoplasm of breast: Secondary | ICD-10-CM | POA: Diagnosis present

## 2022-08-19 NOTE — Progress Notes (Unsigned)
PCP: Karie Schwalbe, MD   No chief complaint on file.   HPI:      [name] is a 67 y.o. G0P0000 whose LMP was No LMP recorded. Patient has had a hysterectomy., presents today for her annual examination.  Her menses are absent due to hyst She {does:18564} have vasomotor sx.   Sex activity: {sex active: 315163}. She {does:18564} have vaginal dryness.  Last Pap: 12/22/18 Results were: no abnormalities  Hx of STDs: {STD hx:14358}  Last mammogram: 08/06/22 Results were: normal--routine follow-up in 12 months There is no FH of breast cancer. There is no FH of ovarian cancer. The patient {does:18564} do self-breast exams. Pt is MyRisk neg 12/21; IBIS****  Colonoscopy: {hx:15363}  Repeat due after 10*** years.   Tobacco use: {tob:20664} Alcohol use: {Alcohol:11675} No drug use Exercise: {exercise:31265}  She {does:18564} get adequate calcium and Vitamin D in her diet.  Labs with PCP.   Patient Active Problem List   Diagnosis Date Noted   Vertigo 07/18/2022   Neck pain 10/31/2021   COVID-19 virus infection 09/06/2021   Essential hypertension, benign 10/20/2019   Prediabetes 10/20/2019   History of vaginal dysplasia 11/29/2016   Osteoarthritis of knee 09/23/2013   Routine general medical examination at a health care facility 02/27/2011   Chronic venous insufficiency 11/14/2009   ALLERGIC RHINITIS 09/30/2009   OBSTRUCTIVE SLEEP APNEA 10/13/2008   Hypothyroidism 11/14/2006   GERD 11/14/2006    Past Surgical History:  Procedure Laterality Date   ABDOMINAL HYSTERECTOMY  1982   Dr Haskel Khan assisted by Dr Lemar Livings   APPENDECTOMY     teenager   BREAST BIOPSY Left 03/09/2015   benign/ Dr. Lemar Livings done in office NEG   BREAST BIOPSY Left 06/22/2020   [place] bx/ vision clip/ benign   BREAST BIOPSY Right 06/29/2021   affirm bx, coil mrker, benign   BREAST EXCISIONAL BIOPSY Left 1999   [facility] NEG   CHONDROPLASTY Right 06/26/2016   Procedure: CHONDROPLASTY;   Surgeon: Teryl Lucy, MD;  Location: [facility];  Service: Orthopedics;  Laterality: Right;   KNEE ARTHROSCOPY W/ LASER  11/2005   Left   KNEE ARTHROSCOPY WITH MEDIAL MENISECTOMY Right 06/26/2016   Procedure: RIGHT KNEE ARTHROSCOPY WITH MEDIAL MENISCECTOMY AND CHONDROPLASTY;  Surgeon: Teryl Lucy, MD;  [facility];  Service: Orthopedics;  Laterality: Right;   MASS EXCISION N/A 02/19/2018   Procedure: EXCISION UPPER LIP MUCOCELE;  Surgeon: Bud Face, MD;  Location: [facility];  Service: ENT;  Laterality: N/A;  sleep apnea   TONSILLECTOMY     as child   VESICOVAGINAL FISTULA CLOSURE W/ TAH  1987   Fibroids    Family History  Problem Relation Age of Onset   Breast cancer Mother 25       BRCA neg   Hypertension Father    Leukemia Brother    Prostate cancer Maternal Grandfather    Breast cancer Maternal Aunt 40   Breast cancer Maternal Aunt 60   Breast cancer Maternal Aunt    Cancer Paternal Uncle        pancreatic cancer   Hypertension Other        both sides   Cancer Other        maternal side   Coronary artery disease Neg Hx    Diabetes Neg Hx    Colon cancer Neg Hx    Liver cancer Neg Hx     Social History   Socioeconomic History  Marital status: Married    Spouse name: Not on file   Number of children: 0   Years of education: Not on file   Highest education level: Not on file  Occupational History   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     Employer: [employer]   Occupation:      Comment:    Tobacco Use   Smoking status: Never    Passive exposure: Past   Smokeless tobacco: Never  Vaping Use   Vaping Use: Never used  Substance and Sexual Activity   Alcohol use: Yes    Comment: beer on weekends- occasional   Drug use: No   Sexual activity: Yes    Birth control/protection: None  Other Topics Concern   Not on file  Social History Narrative   From [place].   Moved to [place] in high school then back  here.     Social Determinants of Health   Financial Resource Strain: Not on file  Food Insecurity: Not on file  Transportation Needs: Not on file  Physical Activity: Not on file  Stress: Not on file  Social Connections: Not on file  Intimate Partner Violence: Not on file     Current Outpatient Medications:    ciclopirox (LOPROX) 0.77 % cream, Apply twice daily until 1 week post clearance, then once a week, Disp: 90 g, Rfl: 3   clotrimazole-betamethasone (LOTRISONE) cream, Apply 1 application topically 2 (two) times daily., Disp: 30 g, Rfl: 0   Crisaborole (EUCRISA) 2 % OINT, Apply 1 Application topically as directed. Qd to bid to aa eczema on body prn flares, Disp: 100 g, Rfl: 11   furosemide (LASIX) 80 MG tablet, TAKE 1 TABLET BY MOUTH DAILY AS NEEDED, Disp: 30 tablet, Rfl: 11   naproxen sodium (ANAPROX) 220 MG tablet, Take 1 tablet by mouth 2 (two) times daily as needed., Disp: , Rfl:    nystatin (NYSTATIN) powder, Apply 1 application topically 3 (three) times daily as needed., Disp: 60 g, Rfl: 3   omeprazole (PRILOSEC) 20 MG capsule, Take 1 capsule (20 mg total) by mouth daily., Disp: 90 capsule, Rfl: 3   triamterene-hydrochlorothiazide (MAXZIDE-25) 37.5-25 MG tablet, TAKE 1 TABLET BY MOUTH DAILY, Disp: 90 tablet, Rfl: 3   valACYclovir (VALTREX) 1000 MG tablet, Take 1 tablet (1,000 mg total) by mouth as directed. Take 2 po at onset of symptoms then take 2 po 12 hours later, Disp: 30 tablet, Rfl: 11     ROS:  Review of Systems BREAST: No symptoms    Objective: There were no vitals taken for this visit.   OBGyn Exam  Results: No results found for this or any previous visit (from the past 24 hour(s)).  Assessment/Plan:  No diagnosis found.   No orders of the defined types were placed in this encounter.           GYN counsel {counseling: 16159}    F/U  No follow-ups on file.  [name] B. Teana Lindahl, PA-C 08/19/2022 9:12 PM

## 2022-08-20 ENCOUNTER — Encounter: Payer: Self-pay | Admitting: Obstetrics and Gynecology

## 2022-08-20 ENCOUNTER — Ambulatory Visit (INDEPENDENT_AMBULATORY_CARE_PROVIDER_SITE_OTHER): Payer: Managed Care, Other (non HMO) | Admitting: Obstetrics and Gynecology

## 2022-08-20 VITALS — BP 140/90 | Ht 64.0 in | Wt 252.0 lb

## 2022-08-20 DIAGNOSIS — Z01419 Encounter for gynecological examination (general) (routine) without abnormal findings: Secondary | ICD-10-CM

## 2022-08-20 DIAGNOSIS — Z1211 Encounter for screening for malignant neoplasm of colon: Secondary | ICD-10-CM

## 2022-08-20 DIAGNOSIS — Z124 Encounter for screening for malignant neoplasm of cervix: Secondary | ICD-10-CM

## 2022-08-20 DIAGNOSIS — Z1231 Encounter for screening mammogram for malignant neoplasm of breast: Secondary | ICD-10-CM

## 2022-08-20 DIAGNOSIS — Z803 Family history of malignant neoplasm of breast: Secondary | ICD-10-CM | POA: Insufficient documentation

## 2022-08-20 DIAGNOSIS — Z9189 Other specified personal risk factors, not elsewhere classified: Secondary | ICD-10-CM | POA: Insufficient documentation

## 2022-08-20 NOTE — Patient Instructions (Signed)
I value your feedback and you entrusting us with your care. If you get a [facility] patient survey, I would appreciate you taking the time to let us know about your experience today. Thank you! ? ? ?

## 2022-08-22 ENCOUNTER — Encounter: Payer: Self-pay | Admitting: Gastroenterology

## 2022-09-03 ENCOUNTER — Encounter: Payer: Self-pay | Admitting: *Deleted

## 2022-10-10 ENCOUNTER — Ambulatory Visit: Payer: Managed Care, Other (non HMO) | Admitting: Dermatology

## 2022-10-10 VITALS — BP 166/93 | HR 73

## 2022-10-10 DIAGNOSIS — R202 Paresthesia of skin: Secondary | ICD-10-CM | POA: Diagnosis not present

## 2022-10-10 DIAGNOSIS — L209 Atopic dermatitis, unspecified: Secondary | ICD-10-CM

## 2022-10-10 DIAGNOSIS — Z79899 Other long term (current) drug therapy: Secondary | ICD-10-CM | POA: Diagnosis not present

## 2022-10-10 DIAGNOSIS — Z7189 Other specified counseling: Secondary | ICD-10-CM

## 2022-10-10 DIAGNOSIS — L299 Pruritus, unspecified: Secondary | ICD-10-CM

## 2022-10-10 DIAGNOSIS — L2089 Other atopic dermatitis: Secondary | ICD-10-CM

## 2022-10-10 MED ORDER — LIDOCAINE 5 % EX CREA: TOPICAL_CREAM | CUTANEOUS | 5 refills | Status: AC

## 2022-10-10 NOTE — Progress Notes (Signed)
   Follow-Up Visit   Subjective  Sabrina [name] is a 67 y.o. female who presents for the following: atopic neurodermatitis - pt c/o itching on the scapula and spinal back as well as R leg crease. Patient uses Eucrisa ointment a few times per week.    The following portions of the chart were reviewed this encounter and updated as appropriate: medications, allergies, medical history  Review of Systems:  No other skin or systemic complaints except as noted in HPI or Assessment and Plan.  Objective  Well appearing patient in no apparent distress; mood and affect are within normal limits.   A focused examination was performed of the following areas: the arms, and back   Relevant exam findings are noted in the Assessment and Plan.    Assessment & Plan   ATOPIC DERMATITIS Exam: macular hyperpigmentation mid to low back spinal  Chronic and persistent condition with duration or expected duration over one year. Condition is symptomatic/ bothersome to patient. Not currently at goal.   Atopic dermatitis (eczema) is a chronic, relapsing, pruritic condition that can significantly affect quality of life. It is often associated with allergic rhinitis and/or asthma and can require treatment with topical medications, phototherapy, or in severe cases biologic injectable medication (Dupixent; Adbry) or Oral JAK inhibitors.  Treatment Plan: Continue Eucrisa 2% ointment to aa's once daily PRN.   Recommend gentle skin care.  NOTALGIA PARESTHETICA  Exam: macular hyperpigmentation mid to low back spinal  Chronic condition without cure secondary to pinched nerve along spine causing itching or sensation changes in an area of skin. Consider evaluation with neurosurgeon. Chronic rubbing or scratching causes darkening of the skin.  OTC treatments which can help with itch include numbing creams like pramoxine or lidocaine which temporarily reduce itch or Capsaicin-containing creams which cause a burning  sensation but which sometimes over time will reset the nerves to stop producing itch.  If you choose to use Capsaicin cream, it is recommended to use it 5 times daily for 1 week followed by 3 times daily for 3-6 weeks. You may have to continue using it long-term.  If not doing well with OTC options.   Start [facility] compounded prescription anti-itch cream with Amitriptyline 5% / Lidocaine 5% / Pramoxine 1% or Amitriptyline 5% / Gabapentin 10% / Lidocaine 5% Cream BID PRN.   Return in about 5 months (around 03/12/2023).  Maylene Roes, CMA, am acting as scribe for Armida Sans, MD .  Documentation: I have reviewed the above documentation for accuracy and completeness, and I agree with the above.  Armida Sans, MD

## 2022-10-10 NOTE — Patient Instructions (Addendum)
Instructions for [facility] Medications  One or more of your medications was sent to the [facility] mail order compounding pharmacy. You will receive an email from them and can purchase the medicine through that link. It will then be mailed to your home at the address you confirmed. If for any reason you do not receive an email from them, please check your spam folder. If you still do not find the email, please let us know. [facility] phone number is [phone].       Due to recent changes in healthcare laws, you may see results of your pathology and/or laboratory studies on MyChart before the doctors have had a chance to review them. We understand that in some cases there may be results that are confusing or concerning to you. Please understand that not all results are received at the same time and often the doctors may need to interpret multiple results in order to provide you with the best plan of care or course of treatment. Therefore, we ask that you please give us 2 business days to thoroughly review all your results before contacting the office for clarification. Should we see a critical lab result, you will be contacted sooner.   If You Need Anything After Your Visit  If you have any questions or concerns for your doctor, please call our main line at [phone] and press option 4 to reach your doctor's medical assistant. If no one answers, please leave a voicemail as directed and we will return your call as soon as possible. Messages left after 4 pm will be answered the following business day.   You may also send us a message via MyChart. We typically respond to MyChart messages within 1-2 business days.  For prescription refills, please ask your pharmacy to contact our office. Our fax number is [phone].  If you have an urgent issue when the clinic is closed that cannot wait until the next business day, you can page your doctor at the number below.    Please note  that while we do our best to be available for urgent issues outside of office hours, we are not available 24/7.   If you have an urgent issue and are unable to reach us, you may choose to seek medical care at your doctor's office, retail clinic, urgent care center, or emergency room.  If you have a medical emergency, please immediately call 911 or go to the emergency department.  Pager Numbers  - Dr. Kowalski: [phone]  - Dr. Moye: [phone]  - Dr. Stewart: [phone]  In the event of inclement weather, please call our main line at [phone] for an update on the status of any delays or closures.  Dermatology Medication Tips: Please keep the boxes that topical medications come in in order to help keep track of the instructions about where and how to use these. Pharmacies typically print the medication instructions only on the boxes and not directly on the medication tubes.   If your medication is too expensive, please contact our office at [phone] option 4 or send us a message through MyChart.   We are unable to tell what your co-pay for medications will be in advance as this is different depending on your insurance coverage. However, we may be able to find a substitute medication at lower cost or fill out paperwork to get insurance to cover a needed medication.   If a prior authorization is required to get your medication covered by your insurance   company, please allow us 1-2 business days to complete this process.  Drug prices often vary depending on where the prescription is filled and some pharmacies may offer cheaper prices.  The website www.goodrx.com contains coupons for medications through different pharmacies. The prices here do not account for what the cost may be with help from insurance (it may be cheaper with your insurance), but the website can give you the price if you did not use any insurance.  - You can print the associated coupon and take it with your  prescription to the pharmacy.  - You may also stop by our office during regular business hours and pick up a GoodRx coupon card.  - If you need your prescription sent electronically to a different pharmacy, notify our office through [facility] MyChart or by phone at [phone] option 4.     Si Usted Necesita Algo Despus de Su Visita  Tambin puede enviarnos un mensaje a travs de MyChart. Por lo general respondemos a los mensajes de MyChart en el transcurso de 1 a 2 das hbiles.  Para renovar recetas, por favor pida a su farmacia que se ponga en contacto con nuestra oficina. Nuestro nmero de fax es el [phone].  Si tiene un asunto urgente cuando la clnica est cerrada y que no puede esperar hasta el siguiente da hbil, puede llamar/localizar a su doctor(a) al nmero que aparece a continuacin.   Por favor, tenga en cuenta que aunque hacemos todo lo posible para estar disponibles para asuntos urgentes fuera del horario de oficina, no estamos disponibles las 24 horas del da, los 7 das de la semana.   Si tiene un problema urgente y no puede comunicarse con nosotros, puede optar por buscar atencin mdica  en el consultorio de su doctor(a), en una clnica privada, en un centro de atencin urgente o en una sala de emergencias.  Si tiene una emergencia mdica, por favor llame inmediatamente al 911 o vaya a la sala de emergencias.  Nmeros de bper  - Dr. Kowalski: [phone]  - Dra. Moye: [phone]  - Dra. Stewart: [phone]  En caso de inclemencias del tiempo, por favor llame a nuestra lnea principal al [phone] para una actualizacin sobre el estado de cualquier retraso o cierre.  Consejos para la medicacin en dermatologa: Por favor, guarde las cajas en las que vienen los medicamentos de uso tpico para ayudarle a seguir las instrucciones sobre dnde y cmo usarlos. Las farmacias generalmente imprimen las instrucciones del medicamento slo en las cajas y no  directamente en los tubos del medicamento.   Si su medicamento es muy caro, por favor, pngase en contacto con nuestra oficina llamando al [phone] y presione la opcin 4 o envenos un mensaje a travs de MyChart.   No podemos decirle cul ser su copago por los medicamentos por adelantado ya que esto es diferente dependiendo de la cobertura de su seguro. Sin embargo, es posible que podamos encontrar un medicamento sustituto a menor costo o llenar un formulario para que el seguro cubra el medicamento que se considera necesario.   Si se requiere una autorizacin previa para que su compaa de seguros cubra su medicamento, por favor permtanos de 1 a 2 das hbiles para completar este proceso.  Los precios de los medicamentos varan con frecuencia dependiendo del lugar de dnde se surte la receta y alguna farmacias pueden ofrecer precios ms baratos.  El sitio web www.goodrx.com tiene cupones para medicamentos de diferentes farmacias. Los precios aqu no tienen en cuenta   lo que podra costar con la ayuda del seguro (puede ser ms barato con su seguro), pero el sitio web puede darle el precio si no utiliz ningn seguro.  - Puede imprimir el cupn correspondiente y llevarlo con su receta a la farmacia.  - Tambin puede pasar por nuestra oficina durante el horario de atencin regular y recoger una tarjeta de cupones de GoodRx.  - Si necesita que su receta se enve electrnicamente a una farmacia diferente, informe a nuestra oficina a travs de MyChart de [place] o por telfono llamando al [phone] y presione la opcin 4.  

## 2022-10-12 ENCOUNTER — Encounter: Payer: Self-pay | Admitting: Dermatology

## 2022-12-17 ENCOUNTER — Encounter (INDEPENDENT_AMBULATORY_CARE_PROVIDER_SITE_OTHER): Payer: Self-pay | Admitting: Vascular Surgery

## 2022-12-17 ENCOUNTER — Ambulatory Visit (INDEPENDENT_AMBULATORY_CARE_PROVIDER_SITE_OTHER): Payer: Managed Care, Other (non HMO) | Admitting: Vascular Surgery

## 2022-12-17 VITALS — BP 162/99 | HR 57 | Resp 18 | Ht 65.0 in | Wt 249.0 lb

## 2022-12-17 DIAGNOSIS — I890 Lymphedema, not elsewhere classified: Secondary | ICD-10-CM | POA: Diagnosis not present

## 2022-12-17 DIAGNOSIS — K219 Gastro-esophageal reflux disease without esophagitis: Secondary | ICD-10-CM | POA: Diagnosis not present

## 2022-12-17 DIAGNOSIS — M170 Bilateral primary osteoarthritis of knee: Secondary | ICD-10-CM | POA: Diagnosis not present

## 2022-12-17 DIAGNOSIS — I10 Essential (primary) hypertension: Secondary | ICD-10-CM | POA: Diagnosis not present

## 2022-12-17 NOTE — Progress Notes (Signed)
MRN : [id number]  Sabrina [name] is a 67 y.o. (1956-03-24) female who presents with chief complaint of legs swell.  History of Present Illness:   Patient is seen for evaluation of leg pain and leg swelling. The patient first noticed the swelling remotely. The swelling is associated with pain and discoloration. The pain and swelling worsens with prolonged dependency and improves with elevation. The pain is unrelated to activity.  The patient notes that in the morning the legs are significantly improved but they steadily worsened throughout the course of the day. The patient also notes a steady worsening of the discoloration in the ankle and shin area.   The patient denies claudication symptoms.  The patient denies symptoms consistent with rest pain.  The patient denies and extensive history of DJD and LS spine disease.  The patient has no had any past angiography, interventions or vascular surgery.  Elevation makes the leg symptoms better, dependency makes them much worse. There is no history of ulcerations. The patient denies any recent changes in medications.  The patient has been wearing graduated compression for 4 years but this has not been helpful.  The patient denies a history of DVT or PE. There is no prior history of phlebitis. There is no history of primary lymphedema.  No history of malignancies. No history of trauma or groin or pelvic surgery. There is no history of radiation treatment to the groin or pelvis  The patient denies amaurosis fugax or recent TIA symptoms. There are no recent neurological changes noted. The patient denies recent episodes of angina or shortness of breath  Duplex ultrasound of the venous system ordered previously by Dr. Alphonsus Sias were negative for acute DVT or other chronic changes.    Current Meds  Medication Sig   Crisaborole (EUCRISA) 2 % OINT Apply 1 Application topically as directed. Qd to bid to aa eczema on body  prn flares   furosemide (LASIX) 80 MG tablet TAKE 1 TABLET BY MOUTH DAILY AS NEEDED   Lidocaine 5 % CREA Apply to aa's BID PRN.   nystatin (NYSTATIN) powder Apply 1 application topically 3 (three) times daily as needed.   omeprazole (PRILOSEC) 20 MG capsule Take 1 capsule (20 mg total) by mouth daily.   triamterene-hydrochlorothiazide (MAXZIDE-25) 37.5-25 MG tablet TAKE 1 TABLET BY MOUTH DAILY   valACYclovir (VALTREX) 1000 MG tablet Take 1 tablet (1,000 mg total) by mouth as directed. Take 2 po at onset of symptoms then take 2 po 12 hours later    Past Medical History:  Diagnosis Date   Allergic rhinitis    Arthritis    Right knee, right elbow   BRCA negative 03/2020   MyRisk neg except MSH3 VUS   Complex tear of medial meniscus of right knee 06/26/2016   Family history of breast cancer    GERD (gastroesophageal reflux disease)    Hypertension    Hypothyroidism 2008   Increased risk of breast cancer 03/2020   IBIS=21.9%/riskscore=19.2%   Obstructive sleep apnea    CPAP   --   NOT ABLE TO USE    PONV (postoperative nausea and vomiting)    after knee surgery   Viral meningitis 5/08   [facility]   Wears dentures    partial upper and lower    Past Surgical History:  Procedure Laterality Date  ABDOMINAL HYSTERECTOMY  1982   Dr Haskel Khan assisted by Dr Lemar Livings   APPENDECTOMY     teenager   BREAST BIOPSY Left 03/09/2015   benign/ Dr. Lemar Livings done in office NEG   BREAST BIOPSY Left 06/22/2020   [place] bx/ vision clip/ benign   BREAST BIOPSY Right 06/29/2021   affirm bx, coil mrker, benign   BREAST EXCISIONAL BIOPSY Left 1999   [facility] NEG   CHONDROPLASTY Right 06/26/2016   Procedure: CHONDROPLASTY;  Surgeon: Teryl Lucy, MD;  Location: [facility];  Service: Orthopedics;  Laterality: Right;   KNEE ARTHROSCOPY W/ LASER  11/2005   Left   KNEE ARTHROSCOPY WITH MEDIAL MENISECTOMY Right 06/26/2016   Procedure: RIGHT KNEE ARTHROSCOPY WITH MEDIAL MENISCECTOMY AND  CHONDROPLASTY;  Surgeon: Teryl Lucy, MD;  Location: [facility];  Service: Orthopedics;  Laterality: Right;   MASS EXCISION N/A 02/19/2018   Procedure: EXCISION UPPER LIP MUCOCELE;  Surgeon: Bud Face, MD;  Location: [facility];  Service: ENT;  Laterality: N/A;  sleep apnea   TONSILLECTOMY     as child   VESICOVAGINAL FISTULA CLOSURE W/ TAH  1987   Fibroids    Social History Social History   Tobacco Use   Smoking status: Never    Passive exposure: Past   Smokeless tobacco: Never  Vaping Use   Vaping status: Never Used  Substance Use Topics   Alcohol use: Yes    Comment: beer on weekends- occasional   Drug use: No    Family History Family History  Problem Relation Age of Onset   Breast cancer Mother 79       BRCA neg   Hypertension Father    Leukemia Brother    Breast cancer Maternal Aunt 40   Breast cancer Maternal Aunt 60   Breast cancer Maternal Aunt 74   Cancer Paternal Uncle        pancreatic cancer   Prostate cancer Maternal Grandfather    Hypertension Other        both sides   Coronary artery disease Neg Hx    Diabetes Neg Hx    Colon cancer Neg Hx    Liver cancer Neg Hx     No Known Allergies   REVIEW OF SYSTEMS (Negative unless checked)  Constitutional: [] Weight loss  [] Fever  [] Chills Cardiac: [] Chest pain   [] Chest pressure   [] Palpitations   [] Shortness of breath when laying flat   [] Shortness of breath with exertion. Vascular:  [] Pain in legs with walking   [x] Pain in legs with standing  [] History of DVT   [] Phlebitis   [x] Swelling in legs   [] Varicose veins   [] Non-healing ulcers Pulmonary:   [] Uses home oxygen   [] Productive cough   [] Hemoptysis   [] Wheeze  [] COPD   [] Asthma Neurologic:  [] Dizziness   [] Seizures   [] History of stroke   [] History of TIA  [] Aphasia   [] Vissual changes   [] Weakness or numbness in arm   [] Weakness or numbness in leg Musculoskeletal:   [] Joint swelling   [] Joint pain   [] Low back  pain Hematologic:  [] Easy bruising  [] Easy bleeding   [] Hypercoagulable state   [] Anemic Gastrointestinal:  [] Diarrhea   [] Vomiting  [x] Gastroesophageal reflux/heartburn   [] Difficulty swallowing. Genitourinary:  [] Chronic kidney disease   [] Difficult urination  [] Frequent urination   [] Blood in urine Skin:  [] Rashes   [] Ulcers  Psychological:  [] History of anxiety   []  History of major depression.  Physical Examination  Vitals:  12/17/22 1022 12/17/22 1023  BP: (!) 166/93 (!) 162/99  Pulse: 66 (!) 57  Resp: 18 18  Weight: 249 lb (112.9 kg)   Height: 5\' 5"  (1.651 m)    Body mass index is 41.44 kg/m. Gen: WD/WN, NAD Head: [place]/AT, No temporalis wasting.  Ear/Nose/Throat: Hearing grossly intact, nares w/o erythema or drainage, pinna without lesions Eyes: PER, EOMI, sclera nonicteric.  Neck: Supple, no gross masses.  No JVD.  Pulmonary:  Good air movement, no audible wheezing, no use of accessory muscles.  Cardiac: RRR, precordium not hyperdynamic. Vascular:  scattered varicosities present bilaterally.  Mild venous stasis changes to the legs bilaterally.  3-4+ soft pitting edema, CEAP C4sEpAsPr  Vessel Right Left  Radial Palpable Palpable  Gastrointestinal: soft, non-distended. No guarding/no peritoneal signs.  Musculoskeletal: M/S 5/5 throughout.  No deformity.  Neurologic: CN 2-12 intact. Pain and light touch intact in extremities.  Symmetrical.  Speech is fluent. Motor exam as listed above. Psychiatric: Judgment intact, Mood & affect appropriate for pt's clinical situation. Dermatologic: Venous rashes no ulcers noted.  No changes consistent with cellulitis. Lymph : No lichenification or skin changes of chronic lymphedema.  CBC Lab Results  Component Value Date   WBC 11.3 (H) 07/05/2022   HGB 12.4 07/05/2022   HCT 39.2 07/05/2022   MCV 95.8 07/05/2022   PLT 273 07/05/2022    BMET    Component Value Date/Time   NA 137 07/05/2022 0047   NA 142 01/08/2022 1559   NA 134  (L) 08/12/2012 1057   K 3.6 07/05/2022 0047   K 3.8 08/12/2012 1057   CL 108 07/05/2022 0047   CL 101 08/12/2012 1057   CO2 24 07/05/2022 0047   CO2 28 08/12/2012 1057   GLUCOSE 138 (H) 07/05/2022 0047   GLUCOSE 112 (H) 08/12/2012 1057   BUN 17 07/05/2022 0047   BUN 10 01/08/2022 1559   BUN 10 08/12/2012 1057   CREATININE 0.76 07/05/2022 0047   CREATININE 0.85 08/12/2012 1057   CALCIUM 8.6 (L) 07/05/2022 0047   CALCIUM 8.9 08/12/2012 1057   GFRNONAA >60 07/05/2022 0047   GFRAA 79 12/22/2019 1038   CrCl cannot be calculated (Patient's most recent lab result is older than the maximum 21 days allowed.).  COAG Lab Results  Component Value Date   INR 1.0 02/27/2020    Radiology No results found.   Assessment/Plan 1. Lymphedema Recommend:  No surgery or intervention at this point in time.   The Patient is CEAP C4sEpAsPr.  The patient has been wearing compression for more than 12 weeks with no or little benefit.  The patient has been exercising daily for more than 12 weeks. The patient has been elevating and taking OTC pain medications for more than 12 weeks.  None of these have have eliminated the pain related to the lymphedema or the discomfort regarding excessive swelling and venous congestion.    I have reviewed my discussion with the patient regarding lymphedema and why it  causes symptoms.  Patient will continue wearing graduated compression on a daily basis. The patient should put the compression on first thing in the morning and removing them in the evening. The patient should not sleep in the compression.   In addition, behavioral modification throughout the day will be continued.  This will include frequent elevation (such as in a recliner), use of over the counter pain medications as needed and exercise such as walking.  The systemic causes for chronic edema such as liver, kidney and  cardiac etiologies do not appear to have significant changed over the past year.     The patient has chronic , severe lymphedema with hyperpigmentation of the skin and has done MLD, skin care, medication, diet, exercise, elevation and compression for 4 weeks with no improvement,  I am recommending a lymphedema pump.  The patient still has stage 3 lymphedema and therefore, I believe that a lymph pump is needed to improve the control of the patient's lymphedema and improve the quality of life.  Additionally, a lymph pump is warranted because it will reduce the risk of cellulitis and ulceration in the future.  Patient should follow-up in 2 months   2. Essential hypertension, benign Continue antihypertensive medications as already ordered, these medications have been reviewed and there are no changes at this time.  3. Gastroesophageal reflux disease, unspecified whether esophagitis present Continue PPI as already ordered, this medication has been reviewed and there are no changes at this time.  Avoidence of caffeine and alcohol  Moderate elevation of the head of the bed   4. Primary osteoarthritis of both knees Continue NSAID medications as already ordered, these medications have been reviewed and there are no changes at this time.  Continued activity and therapy was stressed.    Levora Dredge, MD  12/17/2022 10:50 AM

## 2022-12-19 ENCOUNTER — Telehealth (INDEPENDENT_AMBULATORY_CARE_PROVIDER_SITE_OTHER): Payer: Self-pay | Admitting: Vascular Surgery

## 2022-12-19 NOTE — Telephone Encounter (Signed)
New message    Patient left voicemail .   Regarding pump Rx sent to [facility] on [address].

## 2022-12-19 NOTE — Telephone Encounter (Signed)
I spoke with the patient and she will continue with [employer] company for lymphedema pump. Patient was informed that the process will take a few weeks and someone from the company will be reaching out.

## 2023-01-14 ENCOUNTER — Ambulatory Visit (INDEPENDENT_AMBULATORY_CARE_PROVIDER_SITE_OTHER): Payer: Managed Care, Other (non HMO) | Admitting: Internal Medicine

## 2023-01-14 ENCOUNTER — Encounter: Payer: Self-pay | Admitting: Internal Medicine

## 2023-01-14 VITALS — BP 136/88 | HR 68 | Temp 97.2°F | Ht 64.0 in | Wt 249.0 lb

## 2023-01-14 DIAGNOSIS — Z114 Encounter for screening for human immunodeficiency virus [HIV]: Secondary | ICD-10-CM

## 2023-01-14 DIAGNOSIS — Z23 Encounter for immunization: Secondary | ICD-10-CM | POA: Diagnosis not present

## 2023-01-14 DIAGNOSIS — Z0000 Encounter for general adult medical examination without abnormal findings: Secondary | ICD-10-CM | POA: Diagnosis not present

## 2023-01-14 DIAGNOSIS — I10 Essential (primary) hypertension: Secondary | ICD-10-CM

## 2023-01-14 DIAGNOSIS — Z1159 Encounter for screening for other viral diseases: Secondary | ICD-10-CM | POA: Diagnosis not present

## 2023-01-14 DIAGNOSIS — R7303 Prediabetes: Secondary | ICD-10-CM

## 2023-01-14 DIAGNOSIS — I890 Lymphedema, not elsewhere classified: Secondary | ICD-10-CM | POA: Diagnosis not present

## 2023-01-14 DIAGNOSIS — E039 Hypothyroidism, unspecified: Secondary | ICD-10-CM

## 2023-01-14 MED ORDER — FUROSEMIDE 80 MG PO TABS: 80.0000 mg | ORAL_TABLET | Freq: Every day | ORAL | 11 refills | Status: AC | PRN

## 2023-01-14 MED ORDER — OMEPRAZOLE 20 MG PO CPDR: 20.0000 mg | DELAYED_RELEASE_CAPSULE | Freq: Every day | ORAL | 3 refills | Status: AC

## 2023-01-14 MED ORDER — TRIAMTERENE-HCTZ 37.5-25 MG PO TABS: 1.0000 | ORAL_TABLET | Freq: Every day | ORAL | 3 refills | Status: AC

## 2023-01-14 NOTE — Assessment & Plan Note (Signed)
Healthy Discussed exercise--but has some caregiving stress with her husband Colon due 2026 Mammogram yearly--due April No pap due to hyster Prevnar 20, flu vaccine COVID vaccine at pharmacy

## 2023-01-14 NOTE — Assessment & Plan Note (Signed)
Will check labs

## 2023-01-14 NOTE — Addendum Note (Signed)
Addended by: Eual Fines on: 01/14/2023 03:17 PM   Modules accepted: Orders

## 2023-01-14 NOTE — Assessment & Plan Note (Signed)
Has lasix for prn Now just got lymphedema pump

## 2023-01-14 NOTE — Assessment & Plan Note (Signed)
No Rx now Will check labs

## 2023-01-14 NOTE — Assessment & Plan Note (Signed)
BP Readings from Last 3 Encounters:  01/14/23 136/88  12/17/22 (!) 162/99  10/10/22 (!) 166/93   Okay on maxzide 25

## 2023-01-14 NOTE — Assessment & Plan Note (Signed)
Down 14# in past year Not sure about GLP-1 agonists

## 2023-01-14 NOTE — Progress Notes (Signed)
Subjective:    Patient ID: Sabrina [name], female    DOB: 1955-11-15, 67 y.o.   MRN: [id number]  HPI Here for physical Is retiring soon  Notices some darkening by toes No pain Also has a knot outer mid left calf she wants checked  Just got lymphedema pump Will use it tonight for the first time Takes the furosemide on weekends---hard to use when at work No SOB No cough  Has stress with husband Recurrent cancer with him  Current Outpatient Medications on File Prior to Visit  Medication Sig Dispense Refill   Crisaborole (EUCRISA) 2 % OINT Apply 1 Application topically as directed. Qd to bid to aa eczema on body prn flares 100 g 11   furosemide (LASIX) 80 MG tablet TAKE 1 TABLET BY MOUTH DAILY AS NEEDED 30 tablet 11   Lidocaine 5 % CREA Apply to aa's BID PRN. 30 g 5   nystatin (NYSTATIN) powder Apply 1 application topically 3 (three) times daily as needed. 60 g 3   omeprazole (PRILOSEC) 20 MG capsule Take 1 capsule (20 mg total) by mouth daily. 90 capsule 3   triamterene-hydrochlorothiazide (MAXZIDE-25) 37.5-25 MG tablet TAKE 1 TABLET BY MOUTH DAILY 90 tablet 3   valACYclovir (VALTREX) 1000 MG tablet Take 1 tablet (1,000 mg total) by mouth as directed. Take 2 po at onset of symptoms then take 2 po 12 hours later 30 tablet 11   No current facility-administered medications on file prior to visit.    No Known Allergies  Past Medical History:  Diagnosis Date   Allergic rhinitis    Arthritis    Right knee, right elbow   BRCA negative 03/2020   MyRisk neg except MSH3 VUS   Complex tear of medial meniscus of right knee 06/26/2016   Family history of breast cancer    GERD (gastroesophageal reflux disease)    Hypertension    Hypothyroidism 2008   Increased risk of breast cancer 03/2020   IBIS=21.9%/riskscore=19.2%   Obstructive sleep apnea    CPAP   --   NOT ABLE TO USE    PONV (postoperative nausea and vomiting)    after knee surgery   Viral meningitis 5/08   [facility]   Wears  dentures    partial upper and lower    Past Surgical History:  Procedure Laterality Date   ABDOMINAL HYSTERECTOMY  1982   Dr Haskel Khan assisted by Dr Lemar Livings   APPENDECTOMY     teenager   BREAST BIOPSY Left 03/09/2015   benign/ Dr. Lemar Livings done in office NEG   BREAST BIOPSY Left 06/22/2020   [place] bx/ vision clip/ benign   BREAST BIOPSY Right 06/29/2021   affirm bx, coil mrker, benign   BREAST EXCISIONAL BIOPSY Left 1999   [facility] NEG   CHONDROPLASTY Right 06/26/2016   Procedure: CHONDROPLASTY;  Surgeon: Teryl Lucy, MD;  Location: [facility];  Service: Orthopedics;  Laterality: Right;   KNEE ARTHROSCOPY W/ LASER  11/2005   Left   KNEE ARTHROSCOPY WITH MEDIAL MENISECTOMY Right 06/26/2016   Procedure: RIGHT KNEE ARTHROSCOPY WITH MEDIAL MENISCECTOMY AND CHONDROPLASTY;  Surgeon: Teryl Lucy, MD;  Location: [facility];  Service: Orthopedics;  Laterality: Right;   MASS EXCISION N/A 02/19/2018   Procedure: EXCISION UPPER LIP MUCOCELE;  Surgeon: Bud Face, MD;  Location: [facility];  Service: ENT;  Laterality: N/A;  sleep apnea   TONSILLECTOMY     as child   VESICOVAGINAL FISTULA CLOSURE W/ TAH  1987   Fibroids    Family History  Problem Relation Age of Onset   Breast cancer Mother 59       BRCA neg   Hypertension Father    Leukemia Brother    Breast cancer Maternal Aunt 40   Breast cancer Maternal Aunt 60   Breast cancer Maternal Aunt 38   Cancer Paternal Uncle        pancreatic cancer   Prostate cancer Maternal Grandfather    Hypertension Other        both sides   Coronary artery disease Neg Hx    Diabetes Neg Hx    Colon cancer Neg Hx    Liver cancer Neg Hx     Social History   Socioeconomic History   Marital status: Married    Spouse name: Not on file   Number of children: 0   Years of education: Not on file   Highest education level: Not on file  Occupational History   Occupation: [employer] in [place].     Employer: [employer]   Occupation:      Comment:    Tobacco Use   Smoking status: Never    Passive exposure: Past   Smokeless tobacco: Never  Vaping Use   Vaping status: Never Used  Substance and Sexual Activity   Alcohol use: Yes    Comment: beer on weekends- occasional   Drug use: No   Sexual activity: Not Currently    Birth control/protection: None  Other Topics Concern   Not on file  Social History Narrative   From [place].   Moved to [place] in high school then back here.     Social Determinants of Health   Financial Resource Strain: Not on file  Food Insecurity: Not on file  Transportation Needs: Not on file  Physical Activity: Not on file  Stress: Not on file  Social Connections: Not on file  Intimate Partner Violence: Not on file   Review of Systems  Constitutional:  Negative for fatigue.       Weight down 14# in past year---better with diet Wears seat belt  HENT:  Negative for dental problem, hearing loss and tinnitus.        Keeps up dentist  Eyes:  Negative for redness and visual disturbance.  Respiratory:  Negative for cough, chest tightness and shortness of breath.   Cardiovascular:  Positive for leg swelling. Negative for chest pain and palpitations.  Gastrointestinal:  Negative for blood in stool and constipation.       No heartburn  Endocrine: Negative for polydipsia and polyuria.  Genitourinary:  Negative for dysuria and hematuria.       Urgency with the diuretic  Musculoskeletal:  Negative for arthralgias, back pain and joint swelling.       Legs ache at times  Skin:  Negative for rash.  Allergic/Immunologic: Positive for environmental allergies. Negative for immunocompromised state.       Mild symptoms--occ OTC meds  Neurological:  Negative for dizziness, syncope, light-headedness and headaches.  Hematological:  Negative for adenopathy. Does not bruise/bleed easily.  Psychiatric/Behavioral:  Negative for decreased concentration and sleep  disturbance. The patient is not nervous/anxious.        Objective:   Physical Exam Constitutional:      Appearance: Normal appearance.  HENT:     Mouth/Throat:     Pharynx: No oropharyngeal exudate or posterior oropharyngeal erythema.  Eyes:     Conjunctiva/sclera: Conjunctivae normal.  Pupils: Pupils are equal, round, and reactive to light.  Cardiovascular:     Rate and Rhythm: Normal rate and regular rhythm.     Pulses: Normal pulses.     Heart sounds: No murmur heard.    No gallop.  Pulmonary:     Effort: Pulmonary effort is normal.     Breath sounds: Normal breath sounds. No wheezing or rales.  Abdominal:     Palpations: Abdomen is soft.     Tenderness: There is no abdominal tenderness.  Musculoskeletal:     Cervical back: Neck supple.     Comments: 3+ non pitting edema in calves Some dilated veins---linear pitting lateral left calf (?vein--but superficial nevertheless)  Lymphadenopathy:     Cervical: No cervical adenopathy.  Skin:    Findings: No rash.  Neurological:     General: No focal deficit present.     Mental Status: She is alert and oriented to person, place, and time.  Psychiatric:        Mood and Affect: Mood normal.        Behavior: Behavior normal.            Assessment & Plan:

## 2023-01-15 LAB — COMPREHENSIVE METABOLIC PANEL
ALT: 9 IU/L (ref 0–32)
AST: 13 IU/L (ref 0–40)
Albumin: 4.1 g/dL (ref 3.9–4.9)
Alkaline Phosphatase: 77 IU/L (ref 44–121)
BUN/Creatinine Ratio: 11 — ABNORMAL LOW (ref 12–28)
BUN: 9 mg/dL (ref 8–27)
Bilirubin Total: 0.5 mg/dL (ref 0.0–1.2)
CO2: 24 mmol/L (ref 20–29)
Calcium: 9.2 mg/dL (ref 8.7–10.3)
Chloride: 105 mmol/L (ref 96–106)
Creatinine, Ser: 0.83 mg/dL (ref 0.57–1.00)
Globulin, Total: 2.5 g/dL (ref 1.5–4.5)
Glucose: 96 mg/dL (ref 70–99)
Potassium: 4 mmol/L (ref 3.5–5.2)
Sodium: 143 mmol/L (ref 134–144)
Total Protein: 6.6 g/dL (ref 6.0–8.5)
eGFR: 78 mL/min/{1.73_m2} (ref >59)

## 2023-01-15 LAB — T4, FREE: Free T4: 1.06 ng/dL (ref 0.82–1.77)

## 2023-01-15 LAB — CBC
Hematocrit: 40.5 % (ref 34.0–46.6)
Hemoglobin: 13 g/dL (ref 11.1–15.9)
MCH: 29.9 pg (ref 26.6–33.0)
MCHC: 32.1 g/dL (ref 31.5–35.7)
MCV: 93 fL (ref 79–97)
Platelets: 297 10*3/uL (ref 150–450)
RBC: 4.35 x10E6/uL (ref 3.77–5.28)
RDW: 12.5 % (ref 11.7–15.4)
WBC: 8.5 10*3/uL (ref 3.4–10.8)

## 2023-01-15 LAB — HEMOGLOBIN A1C
Est. average glucose Bld gHb Est-mCnc: 128 mg/dL
Hgb A1c MFr Bld: 6.1 % — ABNORMAL HIGH (ref 4.8–5.6)

## 2023-01-15 LAB — LIPID PANEL
Chol/HDL Ratio: 3.4 ratio (ref 0.0–4.4)
Cholesterol, Total: 186 mg/dL (ref 100–199)
HDL: 54 mg/dL (ref >39)
LDL Chol Calc (NIH): 117 mg/dL — ABNORMAL HIGH (ref 0–99)
Triglycerides: 83 mg/dL (ref 0–149)
VLDL Cholesterol Cal: 15 mg/dL (ref 5–40)

## 2023-01-15 LAB — HIV ANTIBODY (ROUTINE TESTING W REFLEX): HIV Screen 4th Generation wRfx: NONREACTIVE

## 2023-01-15 LAB — TSH: TSH: 4.02 u[IU]/mL (ref 0.450–4.500)

## 2023-01-15 LAB — HEPATITIS C ANTIBODY: Hep C Virus Ab: NONREACTIVE

## 2023-03-13 ENCOUNTER — Ambulatory Visit: Payer: Managed Care, Other (non HMO) | Admitting: Dermatology

## 2023-03-13 DIAGNOSIS — Z79899 Other long term (current) drug therapy: Secondary | ICD-10-CM | POA: Diagnosis not present

## 2023-03-13 DIAGNOSIS — R202 Paresthesia of skin: Secondary | ICD-10-CM

## 2023-03-13 DIAGNOSIS — L2089 Other atopic dermatitis: Secondary | ICD-10-CM

## 2023-03-13 DIAGNOSIS — Z7189 Other specified counseling: Secondary | ICD-10-CM

## 2023-03-13 MED ORDER — OPZELURA 1.5 % EX CREA: TOPICAL_CREAM | CUTANEOUS | 3 refills | Status: AC

## 2023-03-13 NOTE — Progress Notes (Signed)
   Follow-Up Visit   Subjective  Sabrina [name] is a 67 y.o. female who presents for the following: atopic neurodermatitis, patient uses Eucrisa ointment a few times per week. Rash on the feet started in September for which she used Eucrisa 2% ointment and Vaseline which seem to help dry it up.   The following portions of the chart were reviewed this encounter and updated as appropriate: medications, allergies, medical history  Review of Systems:  No other skin or systemic complaints except as noted in HPI or Assessment and Plan.  Objective  Well appearing patient in no apparent distress; mood and affect are within normal limits. A focused examination was performed of the following areas: the arms, and back Relevant exam findings are noted in the Assessment and Plan.   Assessment & Plan   ATOPIC DERMATITIS with psoriasis overlap  Exam: Hyperlinear palms, hyperpigmented macules on the feet. Chronic and persistent condition with duration or expected duration over one year. Condition is symptomatic/ bothersome to patient. Not currently at goal.   Atopic dermatitis (eczema) is a chronic, relapsing, pruritic condition that can significantly affect quality of life. It is often associated with allergic rhinitis and/or asthma and can require treatment with topical medications, phototherapy, or in severe cases biologic injectable medication (Dupixent; Adbry) or Oral JAK inhibitors.  Treatment Plan: D/C Eucrisa. Start Opzelura BID. If not covered or helpful consider Vtama, and if Vtama not helpful recommend [place]. Pt to contact office in 6 weeks to report condition.   NOTALGIA PARESTHETICA Exam: macular hyperpigmentation mid to low back spinal  Chronic condition without cure secondary to pinched nerve along spine causing itching or sensation changes in an area of skin. Consider evaluation with neurosurgeon. Chronic rubbing or scratching causes darkening of the skin.  OTC treatments which can  help with itch include numbing creams like pramoxine or lidocaine which temporarily reduce itch or Capsaicin-containing creams which cause a burning sensation but which sometimes over time will reset the nerves to stop producing itch.  If you choose to use Capsaicin cream, it is recommended to use it 5 times daily for 1 week followed by 3 times daily for 3-6 weeks. You may have to continue using it long-term.  If not doing well with OTC options.   Start [employer] compounded prescription anti-itch cream with Amitriptyline 5% / Lidocaine 5% / Pramoxine 1% or Amitriptyline 5% / Gabapentin 10% / Lidocaine 5% Cream BID PRN.   Return in about 1 year (around 03/12/2024) for atopic neurodermaitis follow up.  Maylene Roes, CMA, am acting as scribe for Armida Sans, MD .  Documentation: I have reviewed the above documentation for accuracy and completeness, and I agree with the above.  Armida Sans, MD

## 2023-03-13 NOTE — Patient Instructions (Addendum)
Your prescription was sent to [facility] in [place]. A representative from [facility] will contact you within 3 business hours to verify your address and insurance information to schedule a free delivery. If for any reason you do not receive a phone call from them, please reach out to them. Their phone number is [phone] and their hours are Monday-Friday 9:00 am-5:00 pm.    Counseling on psoriasis and coordination of care  psoriasis is a chronic non-curable, but treatable genetic/hereditary disease that may have other systemic features affecting other organ systems such as joints (Psoriatic Arthritis). It is associated with an increased risk of inflammatory bowel disease, heart disease, non-alcoholic fatty liver disease, and depression.  Treatments include light and laser treatments; topical medications; and systemic medications including oral and injectables.  Notalgia paresthetica is a chronic condition affecting the skin of the back in which a pinched nerve along the spine causes itching or changes in sensation in an area of skin. This is usually accompanied by chronic rubbing or scratching often leaving the area of skin discolored and thickened. There is no cure, but there are some treatments which may help control the itch.   Over the counter (non-prescription) treatments for notalgia paresthetica include numbing creams like pramoxine or lidocaine which temporarily reduce itch or Capsaicin-containing creams which cause a burning sensation but which sometimes over time will reset the nerves to stop producing itch.  If you choose to use Capsaicin cream, it is recommended to use it 5 times daily for 1 week followed by 3 times daily for 3-6 weeks. You may have to continue using it long-term. - If not doing well with OTC options, could consider Skin Medicinals compounded prescription anti-itch cream with Amitriptyline 5% / Lidocaine 5% / Pramoxine 1% or Amitriptyline 5% / Gabapentin 10% /  Lidocaine 5% Cream. For severe cases, there are some prescription cream or pill options which may help. Other treatment options include: - Transcutaneous Electrical Nerve Stimulation (TENS) - Gabapentin 300-900 mg daily po - Amitriptyline orally - Paravertebral local anesthetic block - intralesional Botulinum toxin A    Due to recent changes in healthcare laws, you may see results of your pathology and/or laboratory studies on MyChart before the doctors have had a chance to review them. We understand that in some cases there may be results that are confusing or concerning to you. Please understand that not all results are received at the same time and often the doctors may need to interpret multiple results in order to provide you with the best plan of care or course of treatment. Therefore, we ask that you please give [place] 2 business days to thoroughly review all your results before contacting the office for clarification. Should we see a critical lab result, you will be contacted sooner.   If You Need Anything After Your Visit  If you have any questions or concerns for your doctor, please call our main line at [phone] and press option 4 to reach your doctor's medical assistant. If no one answers, please leave a voicemail as directed and we will return your call as soon as possible. Messages left after 4 pm will be answered the following business day.   You may also send [place] a message via MyChart. We typically respond to MyChart messages within 1-2 business days.  For prescription refills, please ask your pharmacy to contact our office. Our fax number is [phone].  If you have an urgent issue when the clinic is closed that cannot wait until  the next business day, you can page your doctor at the number below.    Please note that while we do our best to be available for urgent issues outside of office hours, we are not available 24/7.   If you have an urgent issue and are unable to  reach [place], you may choose to seek medical care at your doctor's office, retail clinic, urgent care center, or emergency room.  If you have a medical emergency, please immediately call 911 or go to the emergency department.  Pager Numbers  - Dr. Gwen Pounds: [phone]  - Dr. Roseanne Reno: [phone]  - Dr. Katrinka Blazing: [phone]   In the event of inclement weather, please call our main line at [phone] for an update on the status of any delays or closures.  Dermatology Medication Tips: Please keep the boxes that topical medications come in in order to help keep track of the instructions about where and how to use these. Pharmacies typically print the medication instructions only on the boxes and not directly on the medication tubes.   If your medication is too expensive, please contact our office at [phone] option 4 or send [place] a message through MyChart.   We are unable to tell what your co-pay for medications will be in advance as this is different depending on your insurance coverage. However, we may be able to find a substitute medication at lower cost or fill out paperwork to get insurance to cover a needed medication.   If a prior authorization is required to get your medication covered by your insurance company, please allow [place] 1-2 business days to complete this process.  Drug prices often vary depending on where the prescription is filled and some pharmacies may offer cheaper prices.  The website www.goodrx.com contains coupons for medications through different pharmacies. The prices here do not account for what the cost may be with help from insurance (it may be cheaper with your insurance), but the website can give you the price if you did not use any insurance.  - You can print the associated coupon and take it with your prescription to the pharmacy.  - You may also stop by our office during regular business hours and pick up a GoodRx coupon card.  - If you need your  prescription sent electronically to a different pharmacy, notify our office through [facility] or by phone at [phone] option 4.     Si Usted Necesita Algo Despus de Su Visita  Tambin puede enviarnos un mensaje a travs de Clinical cytogeneticist. Por lo general respondemos a los mensajes de MyChart en el transcurso de 1 a 2 das hbiles.  Para renovar recetas, por favor pida a su farmacia que se ponga en contacto con nuestra oficina. Annie Sable de fax es [place] [phone].  Si tiene un asunto urgente cuando la clnica est cerrada y que no puede esperar hasta el siguiente da hbil, puede llamar/localizar a su doctor(a) al nmero que aparece a continuacin.   Por favor, tenga en cuenta que aunque hacemos todo lo posible para estar disponibles para asuntos urgentes fuera del horario de [place], no estamos disponibles las 24 horas del da, los 7 [address]  [address] de la [place].   Si tiene un problema urgente y no puede comunicarse con nosotros, puede optar por buscar atencin mdica  en el consultorio de su doctor(a), en una clnica privada, en un centro de atencin urgente o en una sala de emergencias.  Si tiene [employer], por favor llame inmediatamente  al 911 o vaya a la sala de Sports administrator.  Nmeros de bper  - Dr. Gwen Pounds: [phone]  - Dra. Roseanne Reno: [phone]  - Dr. Katrinka Blazing: [phone]   En caso de inclemencias del tiempo, por favor llame a Lacy Duverney principal al [phone] para una actualizacin sobre el [place] de cualquier retraso o cierre.  Consejos para la medicacin en dermatologa: Por favor, guarde las cajas en las que vienen los medicamentos de uso tpico para ayudarle a seguir las instrucciones sobre dnde y cmo usarlos. Las farmacias generalmente imprimen las instrucciones del medicamento slo en las cajas y no directamente en los tubos del [place].   Si su medicamento es muy caro, por favor, pngase en contacto con Rolm Gala llamando al  [phone] y presione la opcin 4 o envenos un mensaje a travs de Clinical cytogeneticist.   No podemos decirle cul ser su copago por los medicamentos por adelantado ya que esto es diferente dependiendo de la cobertura de su seguro. Sin embargo, es posible que podamos encontrar un medicamento sustituto a Audiological scientist un formulario para que el seguro cubra el medicamento que se considera necesario.   Si se requiere una autorizacin previa para que su compaa de seguros [place] su medicamento, por favor permtanos de 1 a 2 das hbiles para completar [address]  Los precios de los medicamentos varan con frecuencia dependiendo del Environmental consultant de dnde se surte la receta y alguna farmacias pueden ofrecer precios ms baratos.  El sitio web www.goodrx.com tiene cupones para medicamentos de Health and safety inspector. Los precios aqu no tienen en cuenta lo que podra costar con la ayuda del seguro (puede ser ms barato con su seguro), pero el sitio web puede darle el precio si no utiliz Tourist information centre manager.  - Puede imprimir el cupn correspondiente y llevarlo con su receta a la farmacia.  - Tambin puede pasar por nuestra oficina durante el horario de atencin regular y Education officer, museum una tarjeta de cupones de GoodRx.  - Si necesita que su receta se enve electrnicamente a una farmacia diferente, informe a nuestra oficina a travs de MyChart de  o por telfono llamando al [phone] y presione la opcin 4.

## 2023-03-17 ENCOUNTER — Encounter: Payer: Self-pay | Admitting: Dermatology

## 2023-03-18 ENCOUNTER — Ambulatory Visit (INDEPENDENT_AMBULATORY_CARE_PROVIDER_SITE_OTHER): Payer: Managed Care, Other (non HMO) | Admitting: Vascular Surgery

## 2023-04-14 NOTE — Progress Notes (Signed)
MRN : [id number]  Sabrina [name] is a 67 y.o. (Feb 26, 1956) female who presents with chief complaint of legs swell.  History of Present Illness:   The patient returns to the office for followup evaluation regarding leg swelling.  The swelling has persisted and the pain associated with swelling continues. There have not been any interval development of a ulcerations or wounds.  Since the previous visit the patient has been wearing graduated compression stockings and has noted little if any improvement in the lymphedema. The patient has been using compression routinely morning until night.  The patient also states elevation during the day and exercise is being done too.  Previous duplex ultrasound of the venous system ordered previously by Dr. Alphonsus Sias was negative for acute DVT or other chronic changes.   No outpatient medications have been marked as taking for the 04/15/23 encounter (Appointment) with Gilda Crease, Latina Craver, MD.    Past Medical History:  Diagnosis Date   Allergic rhinitis    Arthritis    Right knee, right elbow   BRCA negative 03/2020   MyRisk neg except MSH3 VUS   Complex tear of medial meniscus of right knee 06/26/2016   Family history of breast cancer    GERD (gastroesophageal reflux disease)    Hypertension    Hypothyroidism 2008   Increased risk of breast cancer 03/2020   IBIS=21.9%/riskscore=19.2%   Obstructive sleep apnea    CPAP   --   NOT ABLE TO USE    PONV (postoperative nausea and vomiting)    after knee surgery   Viral meningitis 5/08   [facility]   Wears dentures    partial upper and lower    Past Surgical History:  Procedure Laterality Date   ABDOMINAL HYSTERECTOMY  1982   Dr Haskel Khan assisted by Dr Lemar Livings   APPENDECTOMY     teenager   BREAST BIOPSY Left 03/09/2015   benign/ Dr. Lemar Livings done in office NEG   BREAST BIOPSY Left 06/22/2020   [place] bx/ vision clip/ benign   BREAST BIOPSY Right 06/29/2021   affirm bx, coil  mrker, benign   BREAST EXCISIONAL BIOPSY Left 1999   [facility] NEG   CHONDROPLASTY Right 06/26/2016   Procedure: CHONDROPLASTY;  Surgeon: Teryl Lucy, MD;  Location: [facility];  Service: Orthopedics;  Laterality: Right;   KNEE ARTHROSCOPY W/ LASER  11/2005   Left   KNEE ARTHROSCOPY WITH MEDIAL MENISECTOMY Right 06/26/2016   Procedure: RIGHT KNEE ARTHROSCOPY WITH MEDIAL MENISCECTOMY AND CHONDROPLASTY;  Surgeon: Teryl Lucy, MD;  Location: [facility];  Service: Orthopedics;  Laterality: Right;   MASS EXCISION N/A 02/19/2018   Procedure: EXCISION UPPER LIP MUCOCELE;  Surgeon: Bud Face, MD;  Location: [facility];  Service: ENT;  Laterality: N/A;  sleep apnea   TONSILLECTOMY     as child   VESICOVAGINAL FISTULA CLOSURE W/ TAH  1987   Fibroids    Social History Social History   Tobacco Use   Smoking status: Never    Passive exposure: Past   Smokeless tobacco: Never  Vaping Use   Vaping status: Never Used  Substance Use Topics   Alcohol use: Yes    Comment: beer on weekends- occasional   Drug use: No    Family History Family History  Problem Relation Age of Onset  Breast cancer Mother 34       BRCA neg   Hypertension Father    Leukemia Brother    Breast cancer Maternal Aunt 40   Breast cancer Maternal Aunt 60   Breast cancer Maternal Aunt 50   Cancer Paternal Uncle        pancreatic cancer   Prostate cancer Maternal Grandfather    Hypertension Other        both sides   Coronary artery disease Neg Hx    Diabetes Neg Hx    Colon cancer Neg Hx    Liver cancer Neg Hx     No Known Allergies   REVIEW OF SYSTEMS (Negative unless checked)  Constitutional: [] Weight loss  [] Fever  [] Chills Cardiac: [] Chest pain   [] Chest pressure   [] Palpitations   [] Shortness of breath when laying flat   [] Shortness of breath with exertion. Vascular:  [] Pain in legs with walking   [x] Pain in legs with standing  [] History of DVT    [] Phlebitis   [x] Swelling in legs   [] Varicose veins   [] Non-healing ulcers Pulmonary:   [] Uses home oxygen   [] Productive cough   [] Hemoptysis   [] Wheeze  [] COPD   [] Asthma Neurologic:  [] Dizziness   [] Seizures   [] History of stroke   [] History of TIA  [] Aphasia   [] Vissual changes   [] Weakness or numbness in arm   [] Weakness or numbness in leg Musculoskeletal:   [] Joint swelling   [] Joint pain   [] Low back pain Hematologic:  [] Easy bruising  [] Easy bleeding   [] Hypercoagulable state   [] Anemic Gastrointestinal:  [] Diarrhea   [] Vomiting  [] Gastroesophageal reflux/heartburn   [] Difficulty swallowing. Genitourinary:  [] Chronic kidney disease   [] Difficult urination  [] Frequent urination   [] Blood in urine Skin:  [] Rashes   [] Ulcers  Psychological:  [] History of anxiety   []  History of major depression.  Physical Examination  There were no vitals filed for this visit. There is no height or weight on file to calculate BMI. Gen: WD/WN, NAD Head: [place]/AT, No temporalis wasting.  Ear/Nose/Throat: Hearing grossly intact, nares w/o erythema or drainage, pinna without lesions Eyes: PER, EOMI, sclera nonicteric.  Neck: Supple, no gross masses.  No JVD.  Pulmonary:  Good air movement, no audible wheezing, no use of accessory muscles.  Cardiac: RRR, precordium not hyperdynamic. Vascular:  scattered varicosities present bilaterally.  Mild venous stasis changes to the legs bilaterally.  3-4+ soft pitting edema, CEAP C4sEpAsPr  Vessel Right Left  Radial Palpable Palpable  Gastrointestinal: soft, non-distended. No guarding/no peritoneal signs.  Musculoskeletal: M/S 5/5 throughout.  No deformity.  Neurologic: CN 2-12 intact. Pain and light touch intact in extremities.  Symmetrical.  Speech is fluent. Motor exam as listed above. Psychiatric: Judgment intact, Mood & affect appropriate for pt's clinical situation. Dermatologic: Venous rashes no ulcers noted.  No changes consistent with cellulitis. Lymph : No  lichenification or skin changes of chronic lymphedema.  CBC Lab Results  Component Value Date   WBC 8.5 01/14/2023   HGB 13.0 01/14/2023   HCT 40.5 01/14/2023   MCV 93 01/14/2023   PLT 297 01/14/2023    BMET    Component Value Date/Time   NA 143 01/14/2023 1511   NA 134 (L) 08/12/2012 1057   K 4.0 01/14/2023 1511   K 3.8 08/12/2012 1057   CL 105 01/14/2023 1511   CL 101 08/12/2012 1057   CO2 24 01/14/2023 1511   CO2 28 08/12/2012 1057   GLUCOSE 96 01/14/2023 1511  GLUCOSE 138 (H) 07/05/2022 0047   GLUCOSE 112 (H) 08/12/2012 1057   BUN 9 01/14/2023 1511   BUN 10 08/12/2012 1057   CREATININE 0.83 01/14/2023 1511   CREATININE 0.85 08/12/2012 1057   CALCIUM 9.2 01/14/2023 1511   CALCIUM 8.9 08/12/2012 1057   GFRNONAA >60 07/05/2022 0047   GFRAA 79 12/22/2019 1038   CrCl cannot be calculated (Patient's most recent lab result is older than the maximum 21 days allowed.).  COAG Lab Results  Component Value Date   INR 1.0 02/27/2020    Radiology No results found.   Assessment/Plan 1. Lymphedema (Primary) Recommend:  No surgery or intervention at this point in time.   The Patient is CEAP C4sEpAsPr.  The patient has been wearing compression for more than 12 weeks with no or little benefit.  The patient has been exercising daily for more than 12 weeks. The patient has been elevating and taking OTC pain medications for more than 12 weeks.  None of these have have eliminated the pain related to the lymphedema or the discomfort regarding excessive swelling and venous congestion.    I have reviewed my discussion with the patient regarding lymphedema and why it  causes symptoms.  Patient will continue wearing graduated compression on a daily basis. The patient should put the compression on first thing in the morning and removing them in the evening. The patient should not sleep in the compression.   In addition, behavioral modification throughout the day will be continued.   This will include frequent elevation (such as in a recliner), use of over the counter pain medications as needed and exercise such as walking.  The systemic causes for chronic edema such as liver, kidney and cardiac etiologies do not appear to have significant changed over the past year.    The patient has chronic , severe lymphedema with hyperpigmentation of the skin and has done MLD, skin care, medication, diet, exercise, elevation and compression for 4 weeks with no improvement,  I am recommending a lymphedema pump.  The patient still has stage 3 lymphedema and therefore, I believe that a lymph pump is needed to improve the control of the patient's lymphedema and improve the quality of life.  Additionally, a lymph pump is warranted because it will reduce the risk of cellulitis and ulceration in the future.  Patient should follow-up in six months   2. Chronic venous insufficiency Recommend:  No surgery or intervention at this point in time.   The Patient is CEAP C4sEpAsPr.  The patient has been wearing compression for more than 12 weeks with no or little benefit.  The patient has been exercising daily for more than 12 weeks. The patient has been elevating and taking OTC pain medications for more than 12 weeks.  None of these have have eliminated the pain related to the lymphedema or the discomfort regarding excessive swelling and venous congestion.    I have reviewed my discussion with the patient regarding lymphedema and why it  causes symptoms.  Patient will continue wearing graduated compression on a daily basis. The patient should put the compression on first thing in the morning and removing them in the evening. The patient should not sleep in the compression.   In addition, behavioral modification throughout the day will be continued.  This will include frequent elevation (such as in a recliner), use of over the counter pain medications as needed and exercise such as walking.  The  systemic causes for chronic edema such as  liver, kidney and cardiac etiologies do not appear to have significant changed over the past year.    The patient has chronic , severe lymphedema with hyperpigmentation of the skin and has done MLD, skin care, medication, diet, exercise, elevation and compression for 4 weeks with no improvement,  I am recommending a lymphedema pump.  The patient still has stage 3 lymphedema and therefore, I believe that a lymph pump is needed to improve the control of the patient's lymphedema and improve the quality of life.  Additionally, a lymph pump is warranted because it will reduce the risk of cellulitis and ulceration in the future.  Patient should follow-up in six months   3. Essential hypertension, benign Continue antihypertensive medications as already ordered, these medications have been reviewed and there are no changes at this time.  4. Gastroesophageal reflux disease, unspecified whether esophagitis present Continue PPI as already ordered, this medication has been reviewed and there are no changes at this time.  Avoidence of caffeine and alcohol  Moderate elevation of the head of the bed     Levora Dredge, MD  04/14/2023 6:15 PM

## 2023-04-15 ENCOUNTER — Ambulatory Visit (INDEPENDENT_AMBULATORY_CARE_PROVIDER_SITE_OTHER): Payer: Managed Care, Other (non HMO) | Admitting: Vascular Surgery

## 2023-04-15 VITALS — BP 182/91 | HR 65 | Resp 16 | Wt 249.0 lb

## 2023-04-15 DIAGNOSIS — I872 Venous insufficiency (chronic) (peripheral): Secondary | ICD-10-CM | POA: Diagnosis not present

## 2023-04-15 DIAGNOSIS — I890 Lymphedema, not elsewhere classified: Secondary | ICD-10-CM

## 2023-04-15 DIAGNOSIS — K219 Gastro-esophageal reflux disease without esophagitis: Secondary | ICD-10-CM | POA: Diagnosis not present

## 2023-04-15 DIAGNOSIS — I10 Essential (primary) hypertension: Secondary | ICD-10-CM

## 2023-04-21 ENCOUNTER — Encounter (INDEPENDENT_AMBULATORY_CARE_PROVIDER_SITE_OTHER): Payer: Self-pay | Admitting: Vascular Surgery

## 2023-07-05 DIAGNOSIS — M75102 Unspecified rotator cuff tear or rupture of left shoulder, not specified as traumatic: Secondary | ICD-10-CM | POA: Diagnosis not present

## 2023-07-05 DIAGNOSIS — R29898 Other symptoms and signs involving the musculoskeletal system: Secondary | ICD-10-CM | POA: Diagnosis not present

## 2023-07-05 DIAGNOSIS — M1712 Unilateral primary osteoarthritis, left knee: Secondary | ICD-10-CM | POA: Diagnosis not present

## 2023-07-08 DIAGNOSIS — Z6841 Body Mass Index (BMI) 40.0 and over, adult: Secondary | ICD-10-CM | POA: Diagnosis not present

## 2023-07-08 DIAGNOSIS — E261 Secondary hyperaldosteronism: Secondary | ICD-10-CM | POA: Diagnosis not present

## 2023-07-08 DIAGNOSIS — I890 Lymphedema, not elsewhere classified: Secondary | ICD-10-CM | POA: Diagnosis not present

## 2023-07-08 DIAGNOSIS — G8929 Other chronic pain: Secondary | ICD-10-CM | POA: Diagnosis not present

## 2023-07-08 DIAGNOSIS — G4733 Obstructive sleep apnea (adult) (pediatric): Secondary | ICD-10-CM | POA: Diagnosis not present

## 2023-07-08 DIAGNOSIS — K219 Gastro-esophageal reflux disease without esophagitis: Secondary | ICD-10-CM | POA: Diagnosis not present

## 2023-07-08 DIAGNOSIS — I110 Hypertensive heart disease with heart failure: Secondary | ICD-10-CM | POA: Diagnosis not present

## 2023-07-08 DIAGNOSIS — I5022 Chronic systolic (congestive) heart failure: Secondary | ICD-10-CM | POA: Diagnosis not present

## 2023-07-30 DIAGNOSIS — R29898 Other symptoms and signs involving the musculoskeletal system: Secondary | ICD-10-CM | POA: Diagnosis not present

## 2023-07-30 DIAGNOSIS — M75102 Unspecified rotator cuff tear or rupture of left shoulder, not specified as traumatic: Secondary | ICD-10-CM | POA: Diagnosis not present

## 2023-08-28 ENCOUNTER — Other Ambulatory Visit: Payer: Self-pay | Admitting: Internal Medicine

## 2023-08-28 DIAGNOSIS — Z1231 Encounter for screening mammogram for malignant neoplasm of breast: Secondary | ICD-10-CM

## 2023-09-02 ENCOUNTER — Other Ambulatory Visit: Payer: Self-pay | Admitting: Dermatology

## 2023-09-02 DIAGNOSIS — B009 Herpesviral infection, unspecified: Secondary | ICD-10-CM

## 2023-09-10 ENCOUNTER — Ambulatory Visit
Admission: RE | Admit: 2023-09-10 | Discharge: 2023-09-10 | Disposition: A | Discharge status: Home / Self Care | Source: Ambulatory Visit | Attending: Internal Medicine | Admitting: Internal Medicine

## 2023-09-10 DIAGNOSIS — Z1231 Encounter for screening mammogram for malignant neoplasm of breast: Secondary | ICD-10-CM | POA: Diagnosis not present

## 2023-09-13 ENCOUNTER — Other Ambulatory Visit: Payer: Self-pay | Admitting: Internal Medicine

## 2023-09-13 DIAGNOSIS — R928 Other abnormal and inconclusive findings on diagnostic imaging of breast: Secondary | ICD-10-CM

## 2023-09-14 ENCOUNTER — Ambulatory Visit: Payer: Self-pay | Admitting: Internal Medicine

## 2023-09-17 ENCOUNTER — Ambulatory Visit (INDEPENDENT_AMBULATORY_CARE_PROVIDER_SITE_OTHER): Payer: Self-pay | Admitting: Obstetrics and Gynecology

## 2023-09-17 ENCOUNTER — Encounter: Payer: Self-pay | Admitting: Obstetrics and Gynecology

## 2023-09-17 VITALS — BP 155/79 | HR 60 | Ht 65.0 in | Wt 250.0 lb

## 2023-09-17 DIAGNOSIS — Z01419 Encounter for gynecological examination (general) (routine) without abnormal findings: Secondary | ICD-10-CM

## 2023-09-17 DIAGNOSIS — Z1231 Encounter for screening mammogram for malignant neoplasm of breast: Secondary | ICD-10-CM

## 2023-09-17 DIAGNOSIS — Z803 Family history of malignant neoplasm of breast: Secondary | ICD-10-CM

## 2023-09-17 DIAGNOSIS — Z9189 Other specified personal risk factors, not elsewhere classified: Secondary | ICD-10-CM

## 2023-09-17 NOTE — Progress Notes (Signed)
 PCP: Helaine Llanos, MD   Chief Complaint  Patient presents with   Gynecologic Exam    No concerns    HPI:      Sabrina [name] is a 68 y.o. G0P0000 whose LMP was No LMP recorded. Patient has had a hysterectomy., presents today for her annual examination.  Her menses are absent due to [facility] due to CPP/leio. No PMB, no pelvic pain. She does not have vasomotor sx.   Sex activity: not sexually active. She does have vaginal dryness, not bothersome.  Last Pap: 12/22/18 Results were: no abnormalities ; no longer indicated  Last mammogram: 09/10/23 Results were: Cat 0 addl views bilat breasts, appt 09/20/23. Hx of neg breast bx in past.  There is a FH of breast cancer in her mom who is BRCA neg; 3 mat aunts and prostate cancer in her MGF. There is no FH of ovarian cancer. The patient does not do self-breast exams. Pt is MyRisk neg except MSH3 VUS 12/21; IBIS=21.9%/riskscore=19.2%. Has not had scr breast MRI. Not taking Vit D supp.  Colonoscopy: 07/02/17 in at [facility] with polyp;  Repeat due after 7 years per pt. Tried to schedule last yr and told due 2026; report in Epic said repeat in 5 yrs.    Tobacco use: The patient denies current or previous tobacco use. Alcohol use: none No drug use Exercise: moderately active  She does get adequate calcium but not Vitamin D in her diet.  Labs with PCP.   Patient Active Problem List   Diagnosis Date Noted   Morbid obesity (HCC) 01/14/2023   Lymphedema 12/17/2022   Family history of breast cancer 08/20/2022   Increased risk of breast cancer 08/20/2022   Vertigo 07/18/2022   Neck pain 10/31/2021   COVID-19 virus infection 09/06/2021   Essential hypertension, benign 10/20/2019   Prediabetes 10/20/2019   History of vaginal dysplasia 11/29/2016   Osteoarthritis of knee 09/23/2013   Routine general medical examination at a health care facility 02/27/2011   Chronic venous insufficiency 11/14/2009   Allergic rhinitis 09/30/2009    OBSTRUCTIVE SLEEP APNEA 10/13/2008   Hypothyroidism 11/14/2006   GERD 11/14/2006    Past Surgical History:  Procedure Laterality Date   APPENDECTOMY     teenager   BREAST BIOPSY Left 03/09/2015   benign/ Dr. Marquita Situ done in office NEG   BREAST BIOPSY Left 06/22/2020   us  bx/ vision clip/ benign   BREAST BIOPSY Right 06/29/2021   affirm bx, coil mrker, benign   BREAST EXCISIONAL BIOPSY Left 1999   [facility] NEG   CHONDROPLASTY Right 06/26/2016   Procedure: CHONDROPLASTY;  Surgeon: Osa Blase, MD;  Location: [facility];  Service: Orthopedics;  Laterality: Right;   KNEE ARTHROSCOPY W/ LASER  11/2005   Left   KNEE ARTHROSCOPY WITH MEDIAL MENISECTOMY Right 06/26/2016   Procedure: RIGHT KNEE ARTHROSCOPY WITH MEDIAL MENISCECTOMY AND CHONDROPLASTY;  Surgeon: Osa Blase, MD;  Location: [facility];  Service: Orthopedics;  Laterality: Right;   MASS EXCISION N/A 02/19/2018   Procedure: EXCISION UPPER LIP MUCOCELE;  Surgeon: Rogers Clayman, MD;  Location: [facility];  Service: ENT;  Laterality: N/A;  sleep apnea   TONSILLECTOMY     as child   TOTAL ABDOMINAL HYSTERECTOMY W/ BILATERAL SALPINGOOPHORECTOMY  1986   Dr Juliann Ochoa assisted by Dr Marquita Situ; chronic pelvic pain/leio   VESICOVAGINAL FISTULA CLOSURE W/ TAH  1987   Fibroids    Family History  Problem Relation Age of  Onset   Breast cancer Mother 83       BRCA neg   Hypertension Father    Leukemia Brother    Breast cancer Maternal Aunt 40   Breast cancer Maternal Aunt 60   Breast cancer Maternal Aunt 49   Cancer Paternal Uncle        pancreatic cancer   Prostate cancer Maternal Grandfather    Hypertension Other        both sides   Coronary artery disease Neg Hx    Diabetes Neg Hx    Colon cancer Neg Hx    Liver cancer Neg Hx     Social History   Socioeconomic History   Marital status: Married    Spouse name: Not on file   Number of children: 0   Years of education:  Not on file   Highest education level: Not on file  Occupational History   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     Employer: [employer]   Occupation:      Comment:    Tobacco Use   Smoking status: Never    Passive exposure: Past   Smokeless tobacco: Never  Vaping Use   Vaping status: Never Used  Substance and Sexual Activity   Alcohol use: Yes    Comment: beer on weekends- occasional   Drug use: No   Sexual activity: Not Currently    Birth control/protection: None  Other Topics Concern   Not on file  Social History Narrative   From [place].   Moved to [place] in high school then back here.     Social Drivers of Corporate investment banker Strain: Low Risk  (07/05/2023)   Received from [facility]   Overall Financial Resource Strain (CARDIA)    Difficulty of Paying Living Expenses: Not very hard  Food Insecurity: No Food Insecurity (07/05/2023)   Received from [facility]   Hunger Vital Sign    Worried About Running Out of Food in the Last Year: Never true    Ran Out of Food in the Last Year: Never true  Transportation Needs: No Transportation Needs (07/05/2023)   Received from [facility] - Transportation    In the past 12 months, has lack of transportation kept you from medical appointments or from getting medications?: No    Lack of Transportation (Non-Medical): No  Physical Activity: Not on file  Stress: Not on file  Social Connections: Not on file  Intimate Partner Violence: Not on file     Current Outpatient Medications:    Crisaborole  (EUCRISA ) 2 % OINT, Apply 1 Application topically as directed. Qd to bid to aa eczema on body prn flares, Disp: 100 g, Rfl: 11   furosemide  (LASIX ) 80 MG tablet, Take 1 tablet (80 mg total) by mouth daily as needed., Disp: 30 tablet, Rfl: 11   Lidocaine  5 % CREA, Apply to aa's BID PRN., Disp: 30 g, Rfl: 5   nystatin  (NYSTATIN ) powder, Apply 1 application topically 3  (three) times daily as needed., Disp: 60 g, Rfl: 3   omeprazole  (PRILOSEC) 20 MG capsule, Take 1 capsule (20 mg total) by mouth daily., Disp: 90 capsule, Rfl: 3   Ruxolitinib Phosphate  (OPZELURA ) 1.5 % CREA, Apply to aa's rash BID, Disp: 60 g, Rfl: 3   triamterene -hydrochlorothiazide (MAXZIDE-25) 37.5-25 MG tablet, Take 1 tablet by mouth daily., Disp: 90 tablet, Rfl: 3   valACYclovir  (VALTREX ) 1000 MG tablet, TAKE  2 TABLETS BY MOUTH AT ONSET OF SYMPTOMS THEN TAKE 2 TABLETS 12 HOURS LATER, Disp: 30 tablet, Rfl: 11     ROS:  Review of Systems  Constitutional:  Negative for fatigue, fever and unexpected weight change.  Respiratory:  Negative for cough, shortness of breath and wheezing.   Cardiovascular:  Negative for chest pain, palpitations and leg swelling.  Gastrointestinal:  Negative for blood in stool, constipation, diarrhea, nausea and vomiting.  Endocrine: Negative for cold intolerance, heat intolerance and polyuria.  Genitourinary:  Negative for dyspareunia, dysuria, flank pain, frequency, genital sores, hematuria, menstrual problem, pelvic pain, urgency, vaginal bleeding, vaginal discharge and vaginal pain.  Musculoskeletal:  Negative for back pain, joint swelling and myalgias.  Skin:  Negative for rash.  Neurological:  Negative for dizziness, syncope, light-headedness, numbness and headaches.  Hematological:  Negative for adenopathy.  Psychiatric/Behavioral:  Negative for agitation, confusion, sleep disturbance and suicidal ideas. The patient is not nervous/anxious.    BREAST: No symptoms    Objective: BP (!) 155/79   Pulse 60   Ht 5\' 5"  (1.651 m)   Wt 250 lb (113.4 kg)   BMI 41.60 kg/m    Physical Exam Constitutional:      Appearance: She is well-developed.  Genitourinary:     Vulva normal.     Genitourinary Comments: UTERUS/CX SURG REM     Right Labia: No rash, tenderness or lesions.    Left Labia: No tenderness, lesions or rash.    Vaginal cuff intact.    No  vaginal discharge, erythema or tenderness.      Right Adnexa: not tender and no mass present.    Left Adnexa: not tender and no mass present.    Cervix is absent.     Uterus is absent.  Breasts:    Right: No mass, nipple discharge, skin change or tenderness.     Left: No mass, nipple discharge, skin change or tenderness.  Neck:     Thyroid: No thyromegaly.  Cardiovascular:     Rate and Rhythm: Normal rate and regular rhythm.     Heart sounds: Normal heart sounds. No murmur heard. Pulmonary:     Effort: Pulmonary effort is normal.     Breath sounds: Normal breath sounds.  Abdominal:     Palpations: Abdomen is soft.     Tenderness: There is no abdominal tenderness. There is no guarding.  Musculoskeletal:        General: Normal range of motion.     Cervical back: Normal range of motion.  Neurological:     General: No focal deficit present.     Mental Status: She is alert and oriented to person, place, and time.     Cranial Nerves: No cranial nerve deficit.  Skin:    General: Skin is warm and dry.  Psychiatric:        Mood and Affect: Mood normal.        Behavior: Behavior normal.        Thought Content: Thought content normal.        Judgment: Judgment normal.  Vitals reviewed.    Assessment/Plan:  Encounter for annual routine gynecological examination  Encounter for screening mammogram for malignant neoplasm of breast; pt has addl views on 09/20/23  Family history of breast cancer--pt is MyRisk neg  Increased risk of breast cancer--aware of recommendations of monthly SBE, yearly CBE and mammos, as well as scr breast MRI. Will check mammo results and if neg, pt would like MRI later this  yr.          GYN counsel breast self exam, mammography screening, adequate intake of calcium and vitamin D, diet and exercise    F/U  Return in about 1 year (around 09/16/2024).  Sabrina Blystone B. Rowene Suto, PA-C 09/17/2023 4:54 PM

## 2023-09-17 NOTE — Patient Instructions (Signed)
 I value your feedback and you entrusting [place] with your care. If you get a [place] patient survey, I would appreciate you taking the time to let us know about your experience today. Thank you! ? ? ?

## 2023-09-20 ENCOUNTER — Ambulatory Visit
Admission: RE | Admit: 2023-09-20 | Discharge: 2023-09-20 | Disposition: A | Discharge status: Home / Self Care | Source: Ambulatory Visit | Attending: Internal Medicine | Admitting: Internal Medicine

## 2023-09-20 DIAGNOSIS — R928 Other abnormal and inconclusive findings on diagnostic imaging of breast: Secondary | ICD-10-CM | POA: Insufficient documentation

## 2023-09-20 DIAGNOSIS — R92333 Mammographic heterogeneous density, bilateral breasts: Secondary | ICD-10-CM | POA: Diagnosis not present

## 2023-09-21 ENCOUNTER — Ambulatory Visit: Payer: Self-pay | Admitting: Internal Medicine

## 2023-09-23 ENCOUNTER — Other Ambulatory Visit: Payer: Self-pay | Admitting: Internal Medicine

## 2023-09-23 ENCOUNTER — Telehealth: Payer: Self-pay | Admitting: Obstetrics and Gynecology

## 2023-09-23 DIAGNOSIS — R921 Mammographic calcification found on diagnostic imaging of breast: Secondary | ICD-10-CM

## 2023-09-23 DIAGNOSIS — R928 Other abnormal and inconclusive findings on diagnostic imaging of breast: Secondary | ICD-10-CM

## 2023-09-23 NOTE — Telephone Encounter (Signed)
 Patient would like you to give her a call at some point today.  Thanks, CJ

## 2023-09-23 NOTE — Telephone Encounter (Signed)
 Spoke with pt. Had abn mammo recommending bx. Discussed with pt. Will f/u with bx results as well. If neg, still recommend MRI in 5-6 months due to FH/increased risk of breast ca.

## 2023-09-25 ENCOUNTER — Ambulatory Visit
Admission: RE | Admit: 2023-09-25 | Discharge: 2023-09-25 | Discharge status: Home / Self Care | Source: Ambulatory Visit | Attending: Internal Medicine | Admitting: Internal Medicine

## 2023-09-25 ENCOUNTER — Ambulatory Visit
Admission: RE | Admit: 2023-09-25 | Discharge: 2023-09-25 | Disposition: A | Discharge status: Home / Self Care | Source: Ambulatory Visit | Attending: Internal Medicine | Admitting: Internal Medicine

## 2023-09-25 DIAGNOSIS — N6012 Diffuse cystic mastopathy of left breast: Secondary | ICD-10-CM | POA: Diagnosis not present

## 2023-09-25 DIAGNOSIS — R921 Mammographic calcification found on diagnostic imaging of breast: Secondary | ICD-10-CM

## 2023-09-25 DIAGNOSIS — R928 Other abnormal and inconclusive findings on diagnostic imaging of breast: Secondary | ICD-10-CM | POA: Insufficient documentation

## 2023-09-25 DIAGNOSIS — R920 Mammographic microcalcification found on diagnostic imaging of breast: Secondary | ICD-10-CM | POA: Diagnosis not present

## 2023-09-25 HISTORY — PX: BREAST BIOPSY: SHX20

## 2023-09-25 MED ORDER — LIDOCAINE 1 % OPTIME INJ - NO CHARGE
5.0000 mL | Freq: Once | INTRAMUSCULAR | Status: AC
  Administered 2023-09-25: 5 mL
  Filled 2023-09-25: qty 6

## 2023-09-25 MED ORDER — CHLOROPROCAINE HCL (PF) 3 % IJ SOLN
3.0000 mL | Freq: Once | INTRAMUSCULAR | Status: AC
  Administered 2023-09-25: 10 mL
  Filled 2023-09-25: qty 3

## 2023-09-25 MED ORDER — LIDOCAINE-EPINEPHRINE 1 %-1:100000 IJ SOLN
20.0000 mL | Freq: Once | INTRAMUSCULAR | Status: AC
  Administered 2023-09-25: 20 mL
  Filled 2023-09-25: qty 20

## 2023-09-26 LAB — SURGICAL PATHOLOGY

## 2023-10-12 NOTE — Progress Notes (Unsigned)
 MRN : [id number]  Sabrina [name] is a 68 y.o. (October 23, 1955) female who presents with chief complaint of legs swell.  History of Present Illness: ***  No outpatient medications have been marked as taking for the 10/14/23 encounter (Appointment) with Jama, Cordella MATSU, MD.    Past Medical History:  Diagnosis Date   Allergic rhinitis    Arthritis    Right knee, right elbow   BRCA negative 03/2020   MyRisk neg except MSH3 VUS   Complex tear of medial meniscus of right knee 06/26/2016   Family history of breast cancer    GERD (gastroesophageal reflux disease)    Hypertension    Hypothyroidism 2008   Increased risk of breast cancer 03/2020   IBIS=21.9%/riskscore=19.2%   Obstructive sleep apnea    CPAP   --   NOT ABLE TO USE    PONV (postoperative nausea and vomiting)    after knee surgery   Viral meningitis 5/08   [facility]   Wears dentures    partial upper and lower    Past Surgical History:  Procedure Laterality Date   APPENDECTOMY     teenager   BREAST BIOPSY Left 03/09/2015   benign/ Dr. Dessa done in office NEG   BREAST BIOPSY Left 06/22/2020   us  bx/ vision clip/ benign   BREAST BIOPSY Right 06/29/2021   affirm bx, coil mrker, benign   BREAST BIOPSY Left 09/25/2023   MM LT BREAST BX W LOC DEV 1ST LESION IMAGE BX SPEC STEREO GUIDE 09/25/2023 [facility]   BREAST EXCISIONAL BIOPSY Left 1999   [facility] NEG   CHONDROPLASTY Right 06/26/2016   Procedure: CHONDROPLASTY;  Surgeon: Fonda Olmsted, MD;  Location: [facility];  Service: Orthopedics;  Laterality: Right;   KNEE ARTHROSCOPY W/ LASER  11/2005   Left   KNEE ARTHROSCOPY WITH MEDIAL MENISECTOMY Right 06/26/2016   Procedure: RIGHT KNEE ARTHROSCOPY WITH MEDIAL MENISCECTOMY AND CHONDROPLASTY;  Surgeon: Fonda Olmsted, MD;  Location: [facility];  Service: Orthopedics;  Laterality: Right;   MASS EXCISION N/A 02/19/2018   Procedure: EXCISION UPPER LIP  MUCOCELE;  Surgeon: Milissa Hamming, MD;  Location: [facility];  Service: ENT;  Laterality: N/A;  sleep apnea   TONSILLECTOMY     as child   TOTAL ABDOMINAL HYSTERECTOMY W/ BILATERAL SALPINGOOPHORECTOMY  1986   Dr Trula assisted by Dr Dessa; chronic pelvic pain/leio   VESICOVAGINAL FISTULA CLOSURE W/ TAH  1987   Fibroids    Social History Social History   Tobacco Use   Smoking status: Never    Passive exposure: Past   Smokeless tobacco: Never  Vaping Use   Vaping status: Never Used  Substance Use Topics   Alcohol use: Yes    Comment: beer on weekends- occasional   Drug use: No    Family History Family History  Problem Relation Age of Onset   Breast cancer Mother 39       BRCA neg   Hypertension Father    Leukemia Brother    Breast cancer Maternal Aunt 40   Breast cancer Maternal Aunt 60   Breast cancer Maternal Aunt 74   Cancer Paternal Uncle        pancreatic cancer   Prostate cancer Maternal Grandfather    Hypertension Other        both  sides   Coronary artery disease Neg Hx    Diabetes Neg Hx    Colon cancer Neg Hx    Liver cancer Neg Hx     No Known Allergies   REVIEW OF SYSTEMS (Negative unless checked)  Constitutional: [] Weight loss  [] Fever  [] Chills Cardiac: [] Chest pain   [] Chest pressure   [] Palpitations   [] Shortness of breath when laying flat   [] Shortness of breath with exertion. Vascular:  [] Pain in legs with walking   [x] Pain in legs with standing  [] History of DVT   [] Phlebitis   [x] Swelling in legs   [] Varicose veins   [] Non-healing ulcers Pulmonary:   [] Uses home oxygen   [] Productive cough   [] Hemoptysis   [] Wheeze  [] COPD   [] Asthma Neurologic:  [] Dizziness   [] Seizures   [] History of stroke   [] History of TIA  [] Aphasia   [] Vissual changes   [] Weakness or numbness in arm   [] Weakness or numbness in leg Musculoskeletal:   [] Joint swelling   [x] Joint pain   [] Low back pain Hematologic:  [] Easy bruising  [] Easy bleeding    [] Hypercoagulable state   [] Anemic Gastrointestinal:  [] Diarrhea   [] Vomiting  [x] Gastroesophageal reflux/heartburn   [] Difficulty swallowing. Genitourinary:  [] Chronic kidney disease   [] Difficult urination  [] Frequent urination   [] Blood in urine Skin:  [] Rashes   [] Ulcers  Psychological:  [] History of anxiety   []  History of major depression.  Physical Examination  There were no vitals filed for this visit. There is no height or weight on file to calculate BMI. Gen: WD/WN, NAD Head: [place]/AT, No temporalis wasting.  Ear/Nose/Throat: Hearing grossly intact, nares w/o erythema or drainage, pinna without lesions Eyes: PER, EOMI, sclera nonicteric.  Neck: Supple, no gross masses.  No JVD.  Pulmonary:  Good air movement, no audible wheezing, no use of accessory muscles.  Cardiac: RRR, precordium not hyperdynamic. Vascular:  scattered varicosities present bilaterally.  Mild venous stasis changes to the legs bilaterally.  3-4+ soft pitting edema, CEAP [id number]  Vessel Right Left  Radial Palpable Palpable  Gastrointestinal: soft, non-distended. No guarding/no peritoneal signs.  Musculoskeletal: M/S 5/5 throughout.  No deformity.  Neurologic: CN 2-12 intact. Pain and light touch intact in extremities.  Symmetrical.  Speech is fluent. Motor exam as listed above. Psychiatric: Judgment intact, Mood & affect appropriate for pt's clinical situation. Dermatologic: Venous rashes no ulcers noted.  No changes consistent with cellulitis. Lymph : No lichenification or skin changes of chronic lymphedema.  CBC Lab Results  Component Value Date   WBC 8.5 01/14/2023   HGB 13.0 01/14/2023   HCT 40.5 01/14/2023   MCV 93 01/14/2023   PLT 297 01/14/2023    BMET    Component Value Date/Time   NA 143 01/14/2023 1511   NA 134 (L) 08/12/2012 1057   K 4.0 01/14/2023 1511   K 3.8 08/12/2012 1057   CL 105 01/14/2023 1511   CL 101 08/12/2012 1057   CO2 24 01/14/2023 1511   CO2 28 08/12/2012 1057    GLUCOSE 96 01/14/2023 1511   GLUCOSE 138 (H) 07/05/2022 0047   GLUCOSE 112 (H) 08/12/2012 1057   BUN 9 01/14/2023 1511   BUN 10 08/12/2012 1057   CREATININE 0.83 01/14/2023 1511   CREATININE 0.85 08/12/2012 1057   CALCIUM 9.2 01/14/2023 1511   CALCIUM 8.9 08/12/2012 1057   GFRNONAA >60 07/05/2022 0047   GFRAA 79 12/22/2019 1038   CrCl cannot be calculated (Sabrina [name]'s most recent lab result is older than  the maximum 21 days allowed.).  COAG Lab Results  Component Value Date   INR 1.0 02/27/2020    Radiology MM LT BREAST BX W LOC DEV 1ST LESION IMAGE BX SPEC STEREO GUIDE Addendum Date: 09/26/2023 ADDENDUM REPORT: 09/26/2023 12:13 ADDENDUM: PATHOLOGY revealed: 1. Breast, left, needle core biopsy: - BENIGN FIBROCYSTIC CHANGE. - NO MALIGNANCY IDENTIFIED. - NUMEROUS MICROCALCIFICATIONS PRESENT. Pathology results are CONCORDANT with imaging findings, per Dirk Arrant M.D. Pathology results and recommendations were discussed with Sabrina [name] via telephone on 09/26/2023 by Rock Hover RN. Sabrina [name] reported biopsy site doing well with no adverse symptoms, and slight tenderness at the site. Post biopsy care instructions were reviewed, questions were answered and my direct phone number was provided. Sabrina [name] was instructed to call [facility] for any additional questions or concerns related to biopsy site. RECOMMENDATION: Sabrina [name] instructed to resume annual bilateral screening mammogram May 2026. Pathology results reported by Rock Hover RN on 09/26/2023. Electronically Signed   By: Dirk Arrant M.D.   On: 09/26/2023 12:13   Result Date: 09/26/2023 CLINICAL DATA:  68 year old female with indeterminate calcifications in the LEFT breast. EXAM: LEFT BREAST STEREOTACTIC CORE NEEDLE BIOPSY COMPARISON:  Previous exam(s). FINDINGS: The Sabrina [name] and I discussed the procedure of stereotactic-guided biopsy including benefits and alternatives. We discussed the high likelihood of a successful procedure. We  discussed the risks of the procedure including infection, bleeding, tissue injury, clip migration, and inadequate sampling. Informed written consent was given. The usual time out protocol was performed immediately prior to the procedure. Using sterile technique and 1% Lidocaine  as local anesthetic, under stereotactic guidance, a 9 gauge vacuum assisted device was used to perform core needle biopsy of calcifications in the upper-outer quadrant of the left breast using a lateral approach. Specimen radiograph was performed showing representative calcifications. Specimens with calcifications are identified for pathology. Lesion quadrant: Upper outer quadrant At the conclusion of the procedure, an X shaped tissue marker clip was deployed into the biopsy cavity. Follow-up 2-view mammogram was performed and dictated separately. IMPRESSION: Stereotactic-guided biopsy of indeterminate calcifications in the upper-outer LEFT breast. No apparent complications. Electronically Signed: By: Dirk Arrant M.D. On: 09/25/2023 09:18   MM CLIP PLACEMENT LEFT Result Date: 09/25/2023 CLINICAL DATA:  Status post stereotactic guided biopsy of calcifications in the LEFT breast. EXAM: 3D DIAGNOSTIC LEFT MAMMOGRAM POST STEREOTACTIC BIOPSY COMPARISON:  Previous exam(s). ACR Breast Density Category c: The breasts are heterogeneously dense, which may obscure small masses. FINDINGS: 3D Mammographic images were obtained following stereotactic guided biopsy of calcifications in the upper-outer left breast posterior depth. The X shaped biopsy marking clip is approximately 20 mm medially displaced from the site of biopsy. IMPRESSION: Approximately 20 mm medial displacement of the X shaped biopsy marking clip from the site of biopsy in the upper-outer LEFT breast. Final Assessment: Post Procedure Mammograms for Marker Placement Electronically Signed   By: Dirk Arrant M.D.   On: 09/25/2023 09:22   MM 3D DIAGNOSTIC MAMMOGRAM BILATERAL  BREAST Result Date: 09/20/2023 CLINICAL DATA:  Screening recall for possible RIGHT breast asymmetry and possible LEFT breast calcifications. Sabrina [name] reports a strong family history of breast cancer. EXAM: DIGITAL DIAGNOSTIC BILATERAL MAMMOGRAM WITH TOMOSYNTHESIS AND CAD TECHNIQUE: Bilateral digital diagnostic mammography and breast tomosynthesis was performed. The images were evaluated with computer-aided detection. COMPARISON:  Previous exam(s). ACR Breast Density Category c: The breasts are heterogeneously dense, which may obscure small masses. FINDINGS: RIGHT: The previously described possible asymmetry in the outer right breast does not persist with additional views,  consistent with superimposed fibroglandular tissue. No suspicious mass, microcalcification, or other finding is identified in the right breast. LEFT: Spot magnification views demonstrate a 6 mm group of coarse heterogeneous calcification in the upper-outer left breast posterior depth. This group of calcifications is favored to be subtly present on priors since at least 2023, with interval coarsening. This corresponds with the screening mammogram finding. Notably, there additional bilateral scattered groups of coarse heterogeneous calcification, with interval coarsening over time. No additional suspicious findings are identified in the left breast. IMPRESSION: 1. LEFT breast 6 mm group of coarse heterogeneous calcification in the upper-outer quadrant is probably benign. We discussed management options including short-term imaging follow-up and stereotactic guided biopsy. The Sabrina [name] opts for stereotactic guided biopsy. 2. No mammographic evidence of malignancy in the RIGHT breast. RECOMMENDATION: LEFT breast stereotactic guided biopsy (1 site). I have discussed the findings and recommendations with the Sabrina [name]. The biopsy procedure was discussed with the Sabrina [name] and questions were answered. Sabrina [name] expressed their understanding of the biopsy  recommendation. Sabrina [name] will be scheduled for biopsy at her earliest convenience by the schedulers. Ordering provider will be notified. If applicable, a reminder letter will be sent to the Sabrina [name] regarding the next appointment. BI-RADS CATEGORY  3: Probably benign. Electronically Signed   By: Dirk Arrant M.D.   On: 09/20/2023 16:02     Assessment/Plan There are no diagnoses linked to this encounter.   Cordella Shawl, MD  10/12/2023 3:29 PM

## 2023-10-14 ENCOUNTER — Encounter (INDEPENDENT_AMBULATORY_CARE_PROVIDER_SITE_OTHER): Payer: Self-pay | Admitting: Vascular Surgery

## 2023-10-14 ENCOUNTER — Ambulatory Visit (INDEPENDENT_AMBULATORY_CARE_PROVIDER_SITE_OTHER): Payer: Managed Care, Other (non HMO) | Admitting: Vascular Surgery

## 2023-10-14 VITALS — BP 139/84 | HR 84 | Ht 65.0 in | Wt 249.1 lb

## 2023-10-14 DIAGNOSIS — I890 Lymphedema, not elsewhere classified: Secondary | ICD-10-CM

## 2023-10-14 DIAGNOSIS — K219 Gastro-esophageal reflux disease without esophagitis: Secondary | ICD-10-CM

## 2023-10-14 DIAGNOSIS — I10 Essential (primary) hypertension: Secondary | ICD-10-CM

## 2023-10-14 DIAGNOSIS — I872 Venous insufficiency (chronic) (peripheral): Secondary | ICD-10-CM | POA: Diagnosis not present

## 2023-10-21 NOTE — Telephone Encounter (Signed)
 Pt had neg breast bx 5/25. Had discussed breast MRI due to FH/increased risk of breast cancer. Would do 10/25. Pt to consider and f/u prn order.

## 2023-11-22 ENCOUNTER — Encounter: Payer: Managed Care, Other (non HMO) | Admitting: Internal Medicine

## 2023-12-04 DIAGNOSIS — M1712 Unilateral primary osteoarthritis, left knee: Secondary | ICD-10-CM | POA: Diagnosis not present

## 2023-12-16 ENCOUNTER — Ambulatory Visit (INDEPENDENT_AMBULATORY_CARE_PROVIDER_SITE_OTHER): Admitting: Internal Medicine

## 2023-12-16 ENCOUNTER — Encounter: Payer: Self-pay | Admitting: Internal Medicine

## 2023-12-16 VITALS — BP 126/86 | HR 80 | Temp 97.7°F | Ht 64.0 in | Wt 242.0 lb

## 2023-12-16 DIAGNOSIS — K219 Gastro-esophageal reflux disease without esophagitis: Secondary | ICD-10-CM | POA: Diagnosis not present

## 2023-12-16 DIAGNOSIS — E039 Hypothyroidism, unspecified: Secondary | ICD-10-CM

## 2023-12-16 DIAGNOSIS — Z0000 Encounter for general adult medical examination without abnormal findings: Secondary | ICD-10-CM

## 2023-12-16 DIAGNOSIS — I10 Essential (primary) hypertension: Secondary | ICD-10-CM | POA: Diagnosis not present

## 2023-12-16 DIAGNOSIS — I872 Venous insufficiency (chronic) (peripheral): Secondary | ICD-10-CM

## 2023-12-16 NOTE — Assessment & Plan Note (Signed)
 Discussed healthy lifestyle--careful with eating

## 2023-12-16 NOTE — Progress Notes (Signed)
 Subjective:    Patient ID: Sabrina [name], female    DOB: [birth year], 68 y.o.   MRN: [id number]  HPI Here for Welcome to Medicare wellness visit and follow up of chronic health conditions  Retired in January Still adjusting--especially with husband's needs Did get a caregiver---and she got out on her own (to go to reunion)   Having trouble with her left knee Told she needs TKR--but can't do it Uses aleve and will be getting special injections  Has needed the furosemide  regularly --- on her feet more when home and caring for husband  Takes the omeprazole  prn Hasn't needed it lately Brushing teeth had stimulated it---and now has new paste that is better No dysphagia  No chest pain or SOB No dizziness or syncope No palpitations Edema controlled  Current Outpatient Medications on File Prior to Visit  Medication Sig Dispense Refill   Crisaborole  (EUCRISA ) 2 % OINT Apply 1 Application topically as directed. Qd to bid to aa eczema on body prn flares 100 g 11   furosemide  (LASIX ) 80 MG tablet Take 1 tablet (80 mg total) by mouth daily as needed. 30 tablet 11   Lidocaine  5 % CREA Apply to aa's BID PRN. 30 g 5   nystatin  (NYSTATIN ) powder Apply 1 application topically 3 (three) times daily as needed. 60 g 3   omeprazole  (PRILOSEC) 20 MG capsule Take 1 capsule (20 mg total) by mouth daily. 90 capsule 3   Ruxolitinib Phosphate  (OPZELURA ) 1.5 % CREA Apply to aa's rash BID 60 g 3   triamterene -hydrochlorothiazide (MAXZIDE-25) 37.5-25 MG tablet Take 1 tablet by mouth daily. 90 tablet 3   valACYclovir  (VALTREX ) 1000 MG tablet TAKE 2 TABLETS BY MOUTH AT ONSET OF SYMPTOMS THEN TAKE 2 TABLETS 12 HOURS LATER 30 tablet 11   No current facility-administered medications on file prior to visit.    No Known Allergies  Past Medical History:  Diagnosis Date   Allergic rhinitis    Arthritis    Right knee, right elbow   BRCA negative 03/2020   MyRisk neg except MSH3 VUS   Complex tear of medial  meniscus of right knee 06/26/2016   Family history of breast cancer    GERD (gastroesophageal reflux disease)    Hypertension    Hypothyroidism 2008   Increased risk of breast cancer 03/2020   IBIS=21.9%/riskscore=19.2%   Obstructive sleep apnea    CPAP   --   NOT ABLE TO USE    PONV (postoperative nausea and vomiting)    after knee surgery   Viral meningitis 5/08   [facility]   Wears dentures    partial upper and lower    Past Surgical History:  Procedure Laterality Date   APPENDECTOMY     teenager   BREAST BIOPSY Left 03/09/2015   benign/ Dr. Dessa done in office NEG   BREAST BIOPSY Left 06/22/2020   us  bx/ vision clip/ benign   BREAST BIOPSY Right 06/29/2021   affirm bx, coil mrker, benign   BREAST BIOPSY Left 09/25/2023   MM LT BREAST BX W LOC DEV 1ST LESION IMAGE BX SPEC STEREO GUIDE 09/25/2023 [facility]   BREAST EXCISIONAL BIOPSY Left 1999   [facility] NEG   CHONDROPLASTY Right 06/26/2016   Procedure: CHONDROPLASTY;  Surgeon: Fonda Olmsted, MD;  Location: [facility];  Service: Orthopedics;  Laterality: Right;   KNEE ARTHROSCOPY W/ LASER  11/2005   Left   KNEE ARTHROSCOPY WITH MEDIAL MENISECTOMY Right 06/26/2016   Procedure:  RIGHT KNEE ARTHROSCOPY WITH MEDIAL MENISCECTOMY AND CHONDROPLASTY;  Surgeon: Fonda Olmsted, MD;  Location: [facility];  Service: Orthopedics;  Laterality: Right;   MASS EXCISION N/A 02/19/2018   Procedure: EXCISION UPPER LIP MUCOCELE;  Surgeon: Milissa Hamming, MD;  Location: [facility];  Service: ENT;  Laterality: N/A;  sleep apnea   TONSILLECTOMY     as child   TOTAL ABDOMINAL HYSTERECTOMY W/ BILATERAL SALPINGOOPHORECTOMY  1986   Dr Trula assisted by Dr Dessa; chronic pelvic pain/leio   VESICOVAGINAL FISTULA CLOSURE W/ TAH  1987   Fibroids    Family History  Problem Relation Age of Onset   Breast cancer Mother 29       BRCA neg   Hypertension Father    Leukemia Brother    Breast cancer  Maternal Aunt 40   Breast cancer Maternal Aunt 60   Breast cancer Maternal Aunt 74   Cancer Paternal Uncle        pancreatic cancer   Prostate cancer Maternal Grandfather    Hypertension Other        both sides   Coronary artery disease Neg Hx    Diabetes Neg Hx    Colon cancer Neg Hx    Liver cancer Neg Hx     Social History   Socioeconomic History   Marital status: Married    Spouse name: Not on file   Number of children: 0   Years of education: Not on file   Highest education level: Not on file  Occupational History   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     Employer: [employer]    Comment: Retired 04/2023   Occupation:      Comment:    Tobacco Use   Smoking status: Never    Passive exposure: Past   Smokeless tobacco: Never  Vaping Use   Vaping status: Never Used  Substance and Sexual Activity   Alcohol use: Yes    Comment: beer on weekends- occasional   Drug use: No   Sexual activity: Not Currently    Birth control/protection: None  Other Topics Concern   Not on file  Social History Narrative   From [place].Moved to [place] in high school then back here.        No set health care directives   Husband and Olivia Like (sister) would be health care POAs   Would accept resuscitation   No prolonged ventilator or tube feeds   Social Drivers of Health   Financial Resource Strain: Low Risk  (12/16/2023)   Overall Financial Resource Strain (CARDIA)    Difficulty of Paying Living Expenses: Not hard at all  Food Insecurity: No Food Insecurity (12/16/2023)   Hunger Vital Sign    Worried About Running Out of Food in the Last Year: Never true    Ran Out of Food in the Last Year: Never true  Transportation Needs: No Transportation Needs (12/16/2023)   PRAPARE - Administrator, Civil Service (Medical): No    Lack of Transportation (Non-Medical): No  Physical Activity: Inactive (12/16/2023)   Exercise Vital Sign    Days of Exercise per Week: 0 days    Minutes  of Exercise per Session: 0 min  Stress: No Stress Concern Present (12/16/2023)   [employer] of Occupational Health - Occupational Stress Questionnaire    Feeling of Stress: Not at all  Social Connections: Moderately Integrated (12/16/2023)   Social Connection and Isolation Panel    Frequency  of Communication with Friends and Family: More than three times a week    Frequency of Social Gatherings with Friends and Family: Once a week    Attends Religious Services: More than 4 times per year    Active Member of [employer] or Organizations: No    Attends Banker Meetings: Never    Marital Status: Married  Catering manager Violence: Not At Risk (12/16/2023)   Humiliation, Afraid, Rape, and Kick questionnaire    Fear of Current or Ex-Partner: No    Emotionally Abused: No    Physically Abused: No    Sexually Abused: No   Review of Systems Appetite is okay Weight down a few pounds but BMI still 41 Hasn't been able to exercise with her knee Sleeps is not great---listens for her husband Bowels move fine---no blood Voids okay--rushes after furosemide  No suspicious skin lesions      Objective:   Physical Exam Constitutional:      Appearance: Normal appearance. She is obese.  HENT:     Mouth/Throat:     Pharynx: No oropharyngeal exudate or posterior oropharyngeal erythema.  Eyes:     Conjunctiva/sclera: Conjunctivae normal.     Pupils: Pupils are equal, round, and reactive to light.  Cardiovascular:     Rate and Rhythm: Normal rate and regular rhythm.     Pulses: Normal pulses.     Heart sounds: No murmur heard.    No gallop.  Pulmonary:     Effort: Pulmonary effort is normal.     Breath sounds: Normal breath sounds. No wheezing or rales.  Abdominal:     Palpations: Abdomen is soft.     Tenderness: There is no abdominal tenderness.  Musculoskeletal:     Cervical back: Neck supple.     Comments: 2-3+ non pitting edema  Lymphadenopathy:     Cervical: No cervical  adenopathy.  Skin:    Findings: No rash.  Neurological:     General: No focal deficit present.     Mental Status: She is alert and oriented to person, place, and time.  Psychiatric:        Mood and Affect: Mood normal.        Behavior: Behavior normal.            Assessment & Plan:    Subjective:    MERRYL BUCKELS is a 68 y.o. female who presents for a Welcome to Medicare exam. Some questions were answered over the phone earlier this morning and have not changed.   Cardiac Risk Factors include: advanced age (>73men, >60 women);hypertension      Objective:    Today's Vitals   12/16/23 1436  BP: 126/86  Pulse: 80  Temp: 97.7 F (36.5 C)  TempSrc: Oral  SpO2: 96%  Weight: 242 lb (109.8 kg)  Height: 5' 4 (1.626 m)  Body mass index is 41.54 kg/m.  Medications Outpatient Encounter Medications as of 12/16/2023  Medication Sig   Crisaborole  (EUCRISA ) 2 % OINT Apply 1 Application topically as directed. Qd to bid to aa eczema on body prn flares   furosemide  (LASIX ) 80 MG tablet Take 1 tablet (80 mg total) by mouth daily as needed.   Lidocaine  5 % CREA Apply to aa's BID PRN.   nystatin  (NYSTATIN ) powder Apply 1 application topically 3 (three) times daily as needed.   omeprazole  (PRILOSEC) 20 MG capsule Take 1 capsule (20 mg total) by mouth daily.   Ruxolitinib Phosphate  (OPZELURA ) 1.5 % CREA Apply to aa's  rash BID   triamterene -hydrochlorothiazide (MAXZIDE-25) 37.5-25 MG tablet Take 1 tablet by mouth daily.   valACYclovir  (VALTREX ) 1000 MG tablet TAKE 2 TABLETS BY MOUTH AT ONSET OF SYMPTOMS THEN TAKE 2 TABLETS 12 HOURS LATER   No facility-administered encounter medications on file as of 12/16/2023.     History: Past Medical History:  Diagnosis Date   Allergic rhinitis    Arthritis    Right knee, right elbow   BRCA negative 03/2020   MyRisk neg except MSH3 VUS   Complex tear of medial meniscus of right knee 06/26/2016   Family history of breast cancer    GERD  (gastroesophageal reflux disease)    Hypertension    Hypothyroidism 2008   Increased risk of breast cancer 03/2020   IBIS=21.9%/riskscore=19.2%   Obstructive sleep apnea    CPAP   --   NOT ABLE TO USE    PONV (postoperative nausea and vomiting)    after knee surgery   Viral meningitis 5/08   [facility]   Wears dentures    partial upper and lower   Past Surgical History:  Procedure Laterality Date   APPENDECTOMY     teenager   BREAST BIOPSY Left 03/09/2015   benign/ Dr. Dessa done in office NEG   BREAST BIOPSY Left 06/22/2020   us  bx/ vision clip/ benign   BREAST BIOPSY Right 06/29/2021   affirm bx, coil mrker, benign   BREAST BIOPSY Left 09/25/2023   MM LT BREAST BX W LOC DEV 1ST LESION IMAGE BX SPEC STEREO GUIDE 09/25/2023 [facility]   BREAST EXCISIONAL BIOPSY Left 1999   [facility] NEG   CHONDROPLASTY Right 06/26/2016   Procedure: CHONDROPLASTY;  Surgeon: Fonda Olmsted, MD;  Location: [facility];  Service: Orthopedics;  Laterality: Right;   KNEE ARTHROSCOPY W/ LASER  11/2005   Left   KNEE ARTHROSCOPY WITH MEDIAL MENISECTOMY Right 06/26/2016   Procedure: RIGHT KNEE ARTHROSCOPY WITH MEDIAL MENISCECTOMY AND CHONDROPLASTY;  Surgeon: Fonda Olmsted, MD;  Location: [facility];  Service: Orthopedics;  Laterality: Right;   MASS EXCISION N/A 02/19/2018   Procedure: EXCISION UPPER LIP MUCOCELE;  Surgeon: Milissa Hamming, MD;  Location: [facility];  Service: ENT;  Laterality: N/A;  sleep apnea   TONSILLECTOMY     as child   TOTAL ABDOMINAL HYSTERECTOMY W/ BILATERAL SALPINGOOPHORECTOMY  1986   Dr Trula assisted by Dr Dessa; chronic pelvic pain/leio   VESICOVAGINAL FISTULA CLOSURE W/ TAH  1987   Fibroids    Family History  Problem Relation Age of Onset   Breast cancer Mother 44       BRCA neg   Hypertension Father    Leukemia Brother    Breast cancer Maternal Aunt 40   Breast cancer Maternal Aunt 60   Breast cancer Maternal Aunt  74   Cancer Paternal Uncle        pancreatic cancer   Prostate cancer Maternal Grandfather    Hypertension Other        both sides   Coronary artery disease Neg Hx    Diabetes Neg Hx    Colon cancer Neg Hx    Liver cancer Neg Hx    Social History   Occupational History   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     Employer: [employer]   Occupation:      Comment:    Tobacco Use   Smoking status: Never    Passive exposure: Past   Smokeless tobacco: Never  Vaping  Use   Vaping status: Never Used  Substance and Sexual Activity   Alcohol use: Yes    Comment: beer on weekends- occasional   Drug use: No   Sexual activity: Not Currently    Birth control/protection: None    Tobacco Counseling Counseling given: Not Answered   Immunizations and Health Maintenance Immunization History  Administered Date(s) Administered   Fluad Trivalent(High Dose 65+) 01/14/2023   Moderna Covid-19 Fall Seasonal Vaccine 52yrs & older 04/08/2023   PFIZER Comirnaty (Gray Top)Covid-19 Tri-Sucrose Vaccine 08/30/2020, 02/05/2022   PFIZER(Purple Top)SARS-COV-2 Vaccination 06/26/2019, 07/17/2019, 01/19/2020   PNEUMOCOCCAL CONJUGATE-20 01/14/2023   Pfizer Covid-19 Vaccine Bivalent Booster 14yrs & up 04/28/2021   Td 09/30/2009, 01/02/2021   Zoster Recombinant(Shingrix ) 10/20/2019, 01/08/2022   Health Maintenance Due  Topic Date Due   Colonoscopy  07/03/2022   DEXA SCAN  11/22/2022   COVID-19 Vaccine (8 - Pfizer risk 2024-25 season) 10/07/2023   INFLUENZA VACCINE  11/22/2023    Activities of Daily Living    12/16/2023   12:15 PM  In your present state of health, do you have any difficulty performing the following activities:  Hearing? 0  Vision? 0  Difficulty concentrating or making decisions? 0  Walking or climbing stairs? 1  Comment bad knee  Dressing or bathing? 0  Doing errands, shopping? 0  Preparing Food and eating ? N  Using the Toilet? N  In the past six months, have you accidently  leaked urine? N  Do you have problems with loss of bowel control? N  Managing your Medications? N  Managing your Finances? N  Housekeeping or managing your Housekeeping? N    Physical Exam   Physical Exam Constitutional:      Appearance: Normal appearance. She is obese.  HENT:     Mouth/Throat:     Pharynx: No oropharyngeal exudate or posterior oropharyngeal erythema.  Eyes:     Conjunctiva/sclera: Conjunctivae normal.     Pupils: Pupils are equal, round, and reactive to light.  Cardiovascular:     Rate and Rhythm: Normal rate and regular rhythm.     Pulses: Normal pulses.     Heart sounds: No murmur heard.    No gallop.  Pulmonary:     Effort: Pulmonary effort is normal.     Breath sounds: Normal breath sounds. No wheezing or rales.  Abdominal:     Palpations: Abdomen is soft.     Tenderness: There is no abdominal tenderness.  Musculoskeletal:     Cervical back: Neck supple.     Comments: 2-3+ non pitting edema  Lymphadenopathy:     Cervical: No cervical adenopathy.  Skin:    Findings: No rash.  Neurological:     General: No focal deficit present.     Mental Status: She is alert and oriented to person, place, and time.  Psychiatric:        Mood and Affect: Mood normal.        Behavior: Behavior normal.    (optional), or other factors deemed appropriate based on the beneficiary's medical and social history and current clinical standards.   Advanced Directives: Does Patient Have a Medical Advance Directive?: No Would patient like information on creating a medical advance directive?: Yes (MAU/Ambulatory/Procedural Areas - Information given)  EKG: Multiple EKGs on file. Last was March 2024      Assessment:    This is a routine wellness examination for this patient .   Vision/Hearing screen Hearing Screening  Method: Audiometry   500Hz  1000Hz  2000Hz   4000Hz   Right ear 20 20 20 20   Left ear 20 20 20 20    Vision Screening   Right eye Left eye Both eyes   Without correction     With correction 20/15 20/25 20/15      Goals       Weight (lb) < 200 lb (90.7 kg) (pt-stated)        Depression Screen    12/16/2023   12:21 PM 01/14/2023    2:15 PM 01/08/2022    3:07 PM 01/02/2021    3:36 PM  PHQ 2/9 Scores  PHQ - 2 Score 0 0 0 0     Fall Risk    12/16/2023   12:23 PM  Fall Risk   Falls in the past year? 0  Number falls in past yr: 0  Injury with Fall? 0  Risk for fall due to : No Fall Risks  Follow up Falls evaluation completed    Cognitive Function:        12/16/2023   12:24 PM  6CIT Screen  What time? 0 points  Count back from 20 0 points  Months in reverse 0 points  Repeat phrase 2 points    Patient Care Team: Jimmy Charlie FERNS, MD as PCP - General Rosenow, Aleene PARAS, MD (Obstetrics and Gynecology) Dessa Reyes ORN, MD (General Surgery)     Plan:     I have personally reviewed and noted the following in the patient's chart:   Medical and social history Use of alcohol, tobacco or illicit drugs  Current medications and supplements including opioid prescriptions. Patient is not currently taking opioid prescriptions. Functional ability and status Nutritional status Physical activity Advanced directives List of other physicians Hospitalizations, surgeries, and ER visits in previous 12 months Vitals Screenings to include cognitive, depression, and falls Referrals and appointments  In addition, I have reviewed and discussed with patient certain preventive protocols, quality metrics, and best practice recommendations. A written personalized care plan for preventive services as well as general preventive health recommendations were provided to patient.     Clotilda SHAUNNA Pander, CMA 12/16/2023

## 2023-12-16 NOTE — Assessment & Plan Note (Signed)
Seems to be euthyroid without meds Will check labs

## 2023-12-16 NOTE — Assessment & Plan Note (Signed)
 Better Has the omeprazole  for prn use

## 2023-12-16 NOTE — Assessment & Plan Note (Signed)
 I have personally reviewed the Medicare Annual Wellness questionnaire and have noted 1. The patient's medical and social history 2. Their use of alcohol, tobacco or illicit drugs 3. Their current medications and supplements 4. The patient's functional ability including ADL's, fall risks, home safety risks and hearing or visual             impairment. 5. Diet and physical activities 6. Evidence for depression or mood disorders  The patients weight, height, BMI and visual acuity have been recorded in the chart I have made referrals, counseling and provided education to the patient based review of the above and I have provided the pt with a written personalized care plan for preventive services.  I have provided you with a copy of your personalized plan for preventive services. Please take the time to review along with your updated medication list.  Colon due again next year Back to yearly mammogram--gyn talking about MRI though No pap due to hyster Should consider chair exercises Flu/COVID vaccines soon (for the fall)

## 2023-12-16 NOTE — Assessment & Plan Note (Signed)
 BP Readings from Last 3 Encounters:  12/16/23 126/86  10/14/23 139/84  09/17/23 (!) 155/79   Controlled with triamterene /hydrochlorothiazide  Will check labs

## 2023-12-16 NOTE — Assessment & Plan Note (Signed)
 Vs lymphedema Using the furosemide  80 daily

## 2023-12-17 ENCOUNTER — Ambulatory Visit: Payer: Self-pay | Admitting: Internal Medicine

## 2023-12-17 LAB — LIPID PANEL
Chol/HDL Ratio: 3.8 ratio (ref 0.0–4.4)
Cholesterol, Total: 210 mg/dL — ABNORMAL HIGH (ref 100–199)
HDL: 55 mg/dL (ref >39)
LDL Chol Calc (NIH): 141 mg/dL — ABNORMAL HIGH (ref 0–99)
Triglycerides: 79 mg/dL (ref 0–149)
VLDL Cholesterol Cal: 14 mg/dL (ref 5–40)

## 2023-12-17 LAB — CBC
Hematocrit: 43.9 % (ref 34.0–46.6)
Hemoglobin: 14.3 g/dL (ref 11.1–15.9)
MCH: 30.4 pg (ref 26.6–33.0)
MCHC: 32.6 g/dL (ref 31.5–35.7)
MCV: 93 fL (ref 79–97)
Platelets: 292 x10E3/uL (ref 150–450)
RBC: 4.7 x10E6/uL (ref 3.77–5.28)
RDW: 12.4 % (ref 11.7–15.4)
WBC: 10.4 x10E3/uL (ref 3.4–10.8)

## 2023-12-17 LAB — T4, FREE: Free T4: 1.13 ng/dL (ref 0.82–1.77)

## 2023-12-17 LAB — HEPATIC FUNCTION PANEL
ALT: 14 IU/L (ref 0–32)
AST: 17 IU/L (ref 0–40)
Alkaline Phosphatase: 92 IU/L (ref 44–121)
Bilirubin Total: 0.6 mg/dL (ref 0.0–1.2)
Bilirubin, Direct: 0.16 mg/dL (ref 0.00–0.40)
Total Protein: 7.1 g/dL (ref 6.0–8.5)

## 2023-12-17 LAB — RENAL FUNCTION PANEL
Albumin: 4.2 g/dL (ref 3.9–4.9)
BUN/Creatinine Ratio: 15 (ref 12–28)
BUN: 14 mg/dL (ref 8–27)
CO2: 27 mmol/L (ref 20–29)
Calcium: 9.7 mg/dL (ref 8.7–10.3)
Chloride: 100 mmol/L (ref 96–106)
Creatinine, Ser: 0.95 mg/dL (ref 0.57–1.00)
Glucose: 122 mg/dL — ABNORMAL HIGH (ref 70–99)
Phosphorus: 3.8 mg/dL (ref 3.0–4.3)
Potassium: 3.8 mmol/L (ref 3.5–5.2)
Sodium: 141 mmol/L (ref 134–144)
eGFR: 66 mL/min/1.73 (ref >59)

## 2023-12-17 LAB — TSH: TSH: 2.63 u[IU]/mL (ref 0.450–4.500)

## 2024-01-01 DIAGNOSIS — M1712 Unilateral primary osteoarthritis, left knee: Secondary | ICD-10-CM | POA: Diagnosis not present

## 2024-01-28 ENCOUNTER — Other Ambulatory Visit: Payer: Self-pay

## 2024-01-28 MED ORDER — COMIRNATY 30 MCG/0.3ML IM SUSY
0.3000 mL | PREFILLED_SYRINGE | Freq: Once | INTRAMUSCULAR | 0 refills | Status: AC
  Filled 2024-01-28: qty 0.3, 1d supply, fill #0

## 2024-02-06 DIAGNOSIS — M722 Plantar fascial fibromatosis: Secondary | ICD-10-CM | POA: Diagnosis not present

## 2024-02-10 ENCOUNTER — Telehealth: Payer: Self-pay | Admitting: Obstetrics and Gynecology

## 2024-02-10 DIAGNOSIS — Z803 Family history of malignant neoplasm of breast: Secondary | ICD-10-CM

## 2024-02-10 DIAGNOSIS — Z9189 Other specified personal risk factors, not elsewhere classified: Secondary | ICD-10-CM

## 2024-02-10 DIAGNOSIS — Z1239 Encounter for other screening for malignant neoplasm of breast: Secondary | ICD-10-CM

## 2024-02-10 NOTE — Telephone Encounter (Signed)
 We had discussed doing a breast MRI due to her family history of breast cancer and her increased risk of it. I would place the order if she is interested. Sabrina [name]

## 2024-02-10 NOTE — Telephone Encounter (Signed)
 Order placed

## 2024-02-10 NOTE — Telephone Encounter (Signed)
 Patient called and wanted to know what was the next step that you wanted her to do in this month(October).  Will you please give her a call ?

## 2024-02-11 ENCOUNTER — Telehealth: Payer: Self-pay | Admitting: Obstetrics and Gynecology

## 2024-02-11 NOTE — Telephone Encounter (Signed)
 Please call the patient she has another question in reference to the MRI?

## 2024-02-11 NOTE — Telephone Encounter (Signed)
 Patient would like to get MRI done through [facility]. States she has 100% coverage with [facility] charity program, anything her insurance doesn't cover, the program covers it 100%.

## 2024-02-11 NOTE — Telephone Encounter (Signed)
 Need to do breast MRI at [facility]. Can you change order or do I need to place another? Not sure where closest imaging is for this--may need to research location.

## 2024-03-12 ENCOUNTER — Ambulatory Visit: Payer: Managed Care, Other (non HMO) | Admitting: Dermatology

## 2024-03-12 DIAGNOSIS — L2089 Other atopic dermatitis: Secondary | ICD-10-CM | POA: Diagnosis not present

## 2024-03-12 DIAGNOSIS — Z79899 Other long term (current) drug therapy: Secondary | ICD-10-CM

## 2024-03-12 DIAGNOSIS — L720 Epidermal cyst: Secondary | ICD-10-CM | POA: Diagnosis not present

## 2024-03-12 DIAGNOSIS — B009 Herpesviral infection, unspecified: Secondary | ICD-10-CM | POA: Diagnosis not present

## 2024-03-12 DIAGNOSIS — Z7189 Other specified counseling: Secondary | ICD-10-CM

## 2024-03-12 DIAGNOSIS — L409 Psoriasis, unspecified: Secondary | ICD-10-CM | POA: Diagnosis not present

## 2024-03-12 DIAGNOSIS — L729 Follicular cyst of the skin and subcutaneous tissue, unspecified: Secondary | ICD-10-CM

## 2024-03-12 NOTE — Progress Notes (Signed)
   Follow-Up Visit   Subjective  Sabrina [name] is a 68 y.o. female who presents for the following: Place at left groin appeared 1 month ago not sore or painful. Hx HSV recently had a flare, takes valtrex  prn flares. Hx atopic dermatitis has Opzelura  cream  at home to use for BID for flares, but has not had to use.   The patient has spots, moles and lesions to be evaluated, some may be new or changing and the patient may have concern these could be cancer.   The following portions of the chart were reviewed this encounter and updated as appropriate: medications, allergies, medical history  Review of Systems:  No other skin or systemic complaints except as noted in HPI or Assessment and Plan.  Objective  Well appearing patient in no apparent distress; mood and affect are within normal limits.  A focused examination was performed of the following areas: Left groin  Relevant exam findings are noted in the Assessment and Plan.    Assessment & Plan   ATOPIC DERMATITIS with psoriasis overlap  Exam: Hyperlinear palms, hyperpigmented macules on the feet. Chronic and persistent condition with duration or expected duration over one year. Condition is symptomatic/ bothersome to patient. Not currently at goal.    Atopic dermatitis (eczema) is a chronic, relapsing, pruritic condition that can significantly affect quality of life. It is often associated with allergic rhinitis and/or asthma and can require treatment with topical medications, phototherapy, or in severe cases biologic injectable medication (Dupixent; Adbry) or Oral JAK inhibitors.   Treatment Plan: Continue Opzelura  BID. Advised patient if skin is dry and inflamed at feet may also use Opzelura  also.  EPIDERMAL INCLUSION CYST Exam: 0.7 cm cyst like papule left anterior waistline above pubic area  Benign-appearing. Exam most consistent with an epidermal inclusion cyst. Discussed that a cyst is a benign growth that can grow over  time and sometimes get irritated or inflamed. Recommend observation if it is not bothersome. Discussed option of surgical excision to remove it if it is growing, symptomatic, or other changes noted. Please call for new or changing lesions so they can be evaluated.  HSV (herpes simplex virus) infection R buttocks crease Recurrent and flared today Herpes Simplex Virus = Cold Sores = Fever Blisters is a chronic recurring blistering; scabbing sore-producing viral infection that is recurrent usually in the same area triggered by stress, sun/UV exposure and trauma.  It is infectious and can be spread from person to person by direct contact.  It is not curable, but is treatable with topical and oral medication.   Continue Valtrex  1gr 2 po bid  for 1 week, then on future onset of symptoms take 2 po at onset of symptoms and 2 po 12 hours later.  HSV (HERPES SIMPLEX VIRUS) INFECTION   Related Medications valACYclovir  (VALTREX ) 1000 MG tablet TAKE 2 TABLETS BY MOUTH AT ONSET OF SYMPTOMS THEN TAKE 2 TABLETS 12 HOURS LATER  Return in about 1 year (around 03/12/2025) for Atopic Dermatitis.  I, Jacquelynn V. Wilfred, CMA, am acting as scribe for Alm Rhyme, MD.  Documentation: I have reviewed the above documentation for accuracy and completeness, and I agree with the above.  Alm Rhyme, MD

## 2024-03-12 NOTE — Patient Instructions (Signed)

## 2024-03-16 ENCOUNTER — Encounter: Payer: Self-pay | Admitting: Dermatology

## 2024-03-23 DIAGNOSIS — Z1239 Encounter for other screening for malignant neoplasm of breast: Secondary | ICD-10-CM | POA: Diagnosis not present

## 2024-03-23 DIAGNOSIS — Z9189 Other specified personal risk factors, not elsewhere classified: Secondary | ICD-10-CM | POA: Diagnosis not present

## 2024-03-23 DIAGNOSIS — Z803 Family history of malignant neoplasm of breast: Secondary | ICD-10-CM | POA: Diagnosis not present

## 2024-03-24 DIAGNOSIS — H9202 Otalgia, left ear: Secondary | ICD-10-CM | POA: Diagnosis not present

## 2024-03-24 DIAGNOSIS — K1121 Acute sialoadenitis: Secondary | ICD-10-CM | POA: Diagnosis not present

## 2024-03-24 DIAGNOSIS — H6062 Unspecified chronic otitis externa, left ear: Secondary | ICD-10-CM | POA: Diagnosis not present

## 2024-03-30 ENCOUNTER — Encounter: Payer: Self-pay | Admitting: Obstetrics and Gynecology

## 2024-03-30 ENCOUNTER — Telehealth: Payer: Self-pay | Admitting: Obstetrics and Gynecology

## 2024-03-30 NOTE — Telephone Encounter (Signed)
 Patient called and wanted to speak with you about the results of a MRI.  Will you please give her a call when you can

## 2024-03-30 NOTE — Telephone Encounter (Signed)
 This encounter was created in error - please disregard.

## 2024-03-30 NOTE — Telephone Encounter (Signed)
 Pt aware of neg breast MRI at [facility]. Due for yearly mammo 6/26.

## 2024-05-28 ENCOUNTER — Telehealth: Payer: Self-pay | Admitting: Internal Medicine

## 2024-05-28 DIAGNOSIS — G8929 Other chronic pain: Secondary | ICD-10-CM

## 2024-05-28 NOTE — Telephone Encounter (Signed)
 Copied from CRM [id number]. Topic: Referral - Request for Referral >> May 11, 2024  4:38 PM Kevelyn M wrote: Did the patient discuss referral with their provider in the last year? No Patient needs referral to orthopedics. Patient now needs a referral for this doctor.  Appointment offered? Yes, No appointments until 12/17/2024  Type of order/referral and detailed reason for visit: Knee pain  Preference of office, provider, location: Dr. Lonni Hailey at [facility] [id number]  If referral order, have you been seen by this specialty before? Yes (If Yes, this issue or another issue? When? Where?  Can we respond through MyChart? No

## 2024-05-28 NOTE — Addendum Note (Signed)
 Addended by: KALLIE CLOTILDA SQUIBB on: 05/28/2024 11:57 AM   Modules accepted: Orders

## 2024-10-12 ENCOUNTER — Ambulatory Visit (INDEPENDENT_AMBULATORY_CARE_PROVIDER_SITE_OTHER): Admitting: Vascular Surgery

## 2024-12-17 ENCOUNTER — Encounter

## 2025-03-15 ENCOUNTER — Ambulatory Visit: Admitting: Dermatology
